# Patient Record
Sex: Female | Born: 1980 | Hispanic: No | Marital: Married | Smoking: Never smoker
Health system: Southern US, Community
[De-identification: ages and names within clinical notes are randomized; demographics above are authoritative.]

## PROBLEM LIST (undated history)

## (undated) DIAGNOSIS — Z789 Other specified health status: Secondary | ICD-10-CM

## (undated) HISTORY — PX: NO PAST SURGERIES: SHX2092

## (undated) HISTORY — DX: Other specified health status: Z78.9

---

## 2013-03-26 NOTE — L&D Delivery Note (Signed)
Delivery Note At 10:47 PM a viable female was delivered via Vaginal, Spontaneous Delivery (Presentation: Left Occiput Anterior).  APGAR: 9, 9; weight 7 lb 12.2 oz (3520 g).   Placenta status: Intact, Spontaneous.  Cord: 3 vessels with the following complications: None.  Cord pH: NA  Anesthesia: Epidural  Episiotomy: None Lacerations: 2nd degree Suture Repair: 3.0 monocryl on CT Est. Blood Loss (mL): 450  Mom to postpartum.  Baby to Couplet care / Skin to Skin.  32yo G2P1001 at 751w3d delivered a viable female infant over an intact perineum with a second degree laceration.  Nuchal cord reduced.  Active management of third stage of labor, pitocin given.  Placenta delivered spontaneously, 3 vessel cord, intact placenta.  Methergine given for persistent bleeding. Second degree laceration repaired with 3-0 monocryl. EBL 450ml.    I was present for the delivery and performed the repair. I agree with resident's note and plan of care.  Tawana ScaleMichael Ryan Chritopher Coster, MD OB Fellow 07/28/2013 12:48 AM

## 2013-04-23 ENCOUNTER — Encounter: Payer: Self-pay | Admitting: Family

## 2013-05-11 ENCOUNTER — Encounter: Payer: Self-pay | Admitting: Family Medicine

## 2013-05-11 ENCOUNTER — Ambulatory Visit (INDEPENDENT_AMBULATORY_CARE_PROVIDER_SITE_OTHER): Payer: Medicaid Other | Admitting: Family Medicine

## 2013-05-11 ENCOUNTER — Other Ambulatory Visit (HOSPITAL_COMMUNITY)
Admission: RE | Admit: 2013-05-11 | Discharge: 2013-05-11 | Disposition: A | Discharge status: Home / Self Care | Payer: Medicaid Other | Source: Ambulatory Visit | Attending: Family Medicine | Admitting: Family Medicine

## 2013-05-11 VITALS — BP 109/65 | Temp 96.9°F | Ht 62.0 in | Wt 125.6 lb

## 2013-05-11 DIAGNOSIS — O0930 Supervision of pregnancy with insufficient antenatal care, unspecified trimester: Secondary | ICD-10-CM

## 2013-05-11 DIAGNOSIS — N841 Polyp of cervix uteri: Secondary | ICD-10-CM | POA: Insufficient documentation

## 2013-05-11 DIAGNOSIS — Z1151 Encounter for screening for human papillomavirus (HPV): Secondary | ICD-10-CM | POA: Insufficient documentation

## 2013-05-11 DIAGNOSIS — Z609 Problem related to social environment, unspecified: Secondary | ICD-10-CM

## 2013-05-11 DIAGNOSIS — Z3480 Encounter for supervision of other normal pregnancy, unspecified trimester: Secondary | ICD-10-CM | POA: Insufficient documentation

## 2013-05-11 DIAGNOSIS — Z113 Encounter for screening for infections with a predominantly sexual mode of transmission: Secondary | ICD-10-CM | POA: Insufficient documentation

## 2013-05-11 DIAGNOSIS — Z01419 Encounter for gynecological examination (general) (routine) without abnormal findings: Secondary | ICD-10-CM | POA: Insufficient documentation

## 2013-05-11 DIAGNOSIS — Z789 Other specified health status: Secondary | ICD-10-CM

## 2013-05-11 DIAGNOSIS — Z23 Encounter for immunization: Secondary | ICD-10-CM

## 2013-05-11 LAB — POCT URINALYSIS DIP (DEVICE)
BILIRUBIN URINE: NEGATIVE
Glucose, UA: NEGATIVE mg/dL
Hgb urine dipstick: NEGATIVE
Ketones, ur: NEGATIVE mg/dL
NITRITE: NEGATIVE
PH: 7 (ref 5.0–8.0)
PROTEIN: NEGATIVE mg/dL
Specific Gravity, Urine: 1.015 (ref 1.005–1.030)
UROBILINOGEN UA: 0.2 mg/dL (ref 0.0–1.0)

## 2013-05-11 MED ORDER — TETANUS-DIPHTH-ACELL PERTUSSIS 5-2.5-18.5 LF-MCG/0.5 IM SUSP: 0.5000 mL | Freq: Once | INTRAMUSCULAR | Status: DC

## 2013-05-11 NOTE — Progress Notes (Signed)
U/S scheduled 05/14/13 at 730 am.

## 2013-05-11 NOTE — Patient Instructions (Addendum)
Breastfeeding Deciding to breastfeed is one of the best choices you can make for you and your baby. A change in hormones during pregnancy causes your breast tissue to grow and increases the number and size of your milk ducts. These hormones also allow proteins, sugars, and fats from your blood supply to make breast milk in your milk-producing glands. Hormones prevent breast milk from being released before your baby is born as well as prompt milk flow after birth. Once breastfeeding has begun, thoughts of your baby, as well as his or her sucking or crying, can stimulate the release of milk from your milk-producing glands.  BENEFITS OF BREASTFEEDING For Your Baby  Your first milk (colostrum) helps your baby's digestive system function better.   There are antibodies in your milk that help your baby fight off infections.   Your baby has a lower incidence of asthma, allergies, and sudden infant death syndrome.   The nutrients in breast milk are better for your baby than infant formulas and are designed uniquely for your baby's needs.   Breast milk improves your baby's brain development.   Your baby is less likely to develop other conditions, such as childhood obesity, asthma, or type 2 diabetes mellitus.  For You   Breastfeeding helps to create a very special bond between you and your baby.   Breastfeeding is convenient. Breast milk is always available at the correct temperature and costs nothing.   Breastfeeding helps to burn calories and helps you lose the weight gained during pregnancy.   Breastfeeding makes your uterus contract to its prepregnancy size faster and slows bleeding (lochia) after you give birth.   Breastfeeding helps to lower your risk of developing type 2 diabetes mellitus, osteoporosis, and breast or ovarian cancer later in life. SIGNS THAT YOUR BABY IS HUNGRY Early Signs of Hunger  Increased alertness or activity.  Stretching.  Movement of the head from  side to side.  Movement of the head and opening of the mouth when the corner of the mouth or cheek is stroked (rooting).  Increased sucking sounds, smacking lips, cooing, sighing, or squeaking.  Hand-to-mouth movements.  Increased sucking of fingers or hands. Late Signs of Hunger  Fussing.  Intermittent crying. Extreme Signs of Hunger Signs of extreme hunger will require calming and consoling before your baby will be able to breastfeed successfully. Do not wait for the following signs of extreme hunger to occur before you initiate breastfeeding:   Restlessness.  A loud, strong cry.   Screaming. BREASTFEEDING BASICS Breastfeeding Initiation  Find a comfortable place to sit or lie down, with your neck and back well supported.  Place a pillow or rolled up blanket under your baby to bring him or her to the level of your breast (if you are seated). Nursing pillows are specially designed to help support your arms and your baby while you breastfeed.  Make sure that your baby's abdomen is facing your abdomen.   Gently massage your breast. With your fingertips, massage from your chest wall toward your nipple in a circular motion. This encourages milk flow. You may need to continue this action during the feeding if your milk flows slowly.  Support your breast with 4 fingers underneath and your thumb above your nipple. Make sure your fingers are well away from your nipple and your baby's mouth.   Stroke your baby's lips gently with your finger or nipple.   When your baby's mouth is open wide enough, quickly bring your baby to your   breast, placing your entire nipple and as much of the colored area around your nipple (areola) as possible into your baby's mouth.   More areola should be visible above your baby's upper lip than below the lower lip.   Your baby's tongue should be between his or her lower gum and your breast.   Ensure that your baby's mouth is correctly positioned  around your nipple (latched). Your baby's lips should create a seal on your breast and be turned out (everted).  It is common for your baby to suck about 2 3 minutes in order to start the flow of breast milk. Latching Teaching your baby how to latch on to your breast properly is very important. An improper latch can cause nipple pain and decreased milk supply for you and poor weight gain in your baby. Also, if your baby is not latched onto your nipple properly, he or she may swallow some air during feeding. This can make your baby fussy. Burping your baby when you switch breasts during the feeding can help to get rid of the air. However, teaching your baby to latch on properly is still the best way to prevent fussiness from swallowing air while breastfeeding. Signs that your baby has successfully latched on to your nipple:    Silent tugging or silent sucking, without causing you pain.   Swallowing heard between every 3 4 sucks.    Muscle movement above and in front of his or her ears while sucking.  Signs that your baby has not successfully latched on to nipple:   Sucking sounds or smacking sounds from your baby while breastfeeding.  Nipple pain. If you think your baby has not latched on correctly, slip your finger into the corner of your baby's mouth to break the suction and place it between your baby's gums. Attempt breastfeeding initiation again. Signs of Successful Breastfeeding Signs from your baby:   A gradual decrease in the number of sucks or complete cessation of sucking.   Falling asleep.   Relaxation of his or her body.   Retention of a small amount of milk in his or her mouth.   Letting go of your breast by himself or herself. Signs from you:  Breasts that have increased in firmness, weight, and size 1 3 hours after feeding.   Breasts that are softer immediately after breastfeeding.  Increased milk volume, as well as a change in milk consistency and color by  the 5th day of breastfeeding.   Nipples that are not sore, cracked, or bleeding. Signs That Your Randel Books is Getting Enough Milk  Wetting at least 3 diapers in a 24-hour period. The urine should be clear and pale yellow by age 64411 days.  At least 3 stools in a 24-hour period by age 64411 days. The stool should be soft and yellow.  At least 3 stools in a 24-hour period by age 644 days. The stool should be seedy and yellow.  No loss of weight greater than 10% of birth weight during the first 22 days of age.  Average weight gain of 4 7 ounces (120 210 mL) per week after age 64 days.  Consistent daily weight gain by age 60 days, without weight loss after the age of 2 weeks. After a feeding, your baby may spit up a small amount. This is common. BREASTFEEDING FREQUENCY AND DURATION Frequent feeding will help you make more milk and can prevent sore nipples and breast engorgement. Breastfeed when you feel the need to reduce  the fullness of your breasts or when your baby shows signs of hunger. This is called "breastfeeding on demand." Avoid introducing a pacifier to your baby while you are working to establish breastfeeding (the first 4 6 weeks after your baby is born). After this time you may choose to use a pacifier. Research has shown that pacifier use during the first year of a baby's life decreases the risk of sudden infant death syndrome (SIDS). Allow your baby to feed on each breast as long as he or she wants. Breastfeed until your baby is finished feeding. When your baby unlatches or falls asleep while feeding from the first breast, offer the second breast. Because newborns are often sleepy in the first few weeks of life, you may need to awaken your baby to get him or her to feed. Breastfeeding times will vary from baby to baby. However, the following rules can serve as a guide to help you ensure that your baby is properly fed:  Newborns (babies 4 weeks of age or younger) may breastfeed every 1 3  hours.  Newborns should not go longer than 3 hours during the day or 5 hours during the night without breastfeeding.  You should breastfeed your baby a minimum of 8 times in a 24-hour period until you begin to introduce solid foods to your baby at around 6 months of age. BREAST MILK PUMPING Pumping and storing breast milk allows you to ensure that your baby is exclusively fed your breast milk, even at times when you are unable to breastfeed. This is especially important if you are going back to work while you are still breastfeeding or when you are not able to be present during feedings. Your lactation consultant can give you guidelines on how long it is safe to store breast milk.  A breast pump is a machine that allows you to pump milk from your breast into a sterile bottle. The pumped breast milk can then be stored in a refrigerator or freezer. Some breast pumps are operated by hand, while others use electricity. Ask your lactation consultant which type will work best for you. Breast pumps can be purchased, but some hospitals and breastfeeding support groups lease breast pumps on a monthly basis. A lactation consultant can teach you how to hand express breast milk, if you prefer not to use a pump.  CARING FOR YOUR BREASTS WHILE YOU BREASTFEED Nipples can become dry, cracked, and sore while breastfeeding. The following recommendations can help keep your breasts moisturized and healthy:  Avoid using soap on your nipples.   Wear a supportive bra. Although not required, special nursing bras and tank tops are designed to allow access to your breasts for breastfeeding without taking off your entire bra or top. Avoid wearing underwire style bras or extremely tight bras.  Air dry your nipples for 3 4minutes after each feeding.   Use only cotton bra pads to absorb leaked breast milk. Leaking of breast milk between feedings is normal.   Use lanolin on your nipples after breastfeeding. Lanolin helps to  maintain your skin's normal moisture barrier. If you use pure lanolin you do not need to wash it off before feeding your baby again. Pure lanolin is not toxic to your baby. You may also hand express a few drops of breast milk and gently massage that milk into your nipples and allow the milk to air dry. In the first few weeks after giving birth, some women experience extremely full breasts (engorgement). Engorgement can make   your breasts feel heavy, warm, and tender to the touch. Engorgement peaks within 3 5 days after you give birth. The following recommendations can help ease engorgement:  Completely empty your breasts while breastfeeding or pumping. You may want to start by applying warm, moist heat (in the shower or with warm water-soaked hand towels) just before feeding or pumping. This increases circulation and helps the milk flow. If your baby does not completely empty your breasts while breastfeeding, pump any extra milk after he or she is finished.  Wear a snug bra (nursing or regular) or tank top for 1 2 days to signal your body to slightly decrease milk production.  Apply ice packs to your breasts, unless this is too uncomfortable for you.  Make sure that your baby is latched on and positioned properly while breastfeeding. If engorgement persists after 48 hours of following these recommendations, contact your health care provider or a Advertising copywriterlactation consultant. OVERALL HEALTH CARE RECOMMENDATIONS WHILE BREASTFEEDING  Eat healthy foods. Alternate between meals and snacks, eating 3 of each per day. Because what you eat affects your breast milk, some of the foods may make your baby more irritable than usual. Avoid eating these foods if you are sure that they are negatively affecting your baby.  Drink milk, fruit juice, and water to satisfy your thirst (about 10 glasses a day).   Rest often, relax, and continue to take your prenatal vitamins to prevent fatigue, stress, and anemia.  Continue  breast self-awareness checks.  Avoid chewing and smoking tobacco.  Avoid alcohol and drug use. Some medicines that may be harmful to your baby can pass through breast milk. It is important to ask your health care provider before taking any medicine, including all over-the-counter and prescription medicine as well as vitamin and herbal supplements. It is possible to become pregnant while breastfeeding. If birth control is desired, ask your health care provider about options that will be safe for your baby. SEEK MEDICAL CARE IF:   You feel like you want to stop breastfeeding or have become frustrated with breastfeeding.  You have painful breasts or nipples.  Your nipples are cracked or bleeding.  Your breasts are red, tender, or warm.  You have a swollen area on either breast.  You have a fever or chills.  You have nausea or vomiting.  You have drainage other than breast milk from your nipples.  Your breasts do not become full before feedings by the 5th day after you give birth.  You feel sad and depressed.  Your baby is too sleepy to eat well.  Your baby is having trouble sleeping.   Your baby is wetting less than 3 diapers in a 24-hour period.  Your baby has less than 3 stools in a 24-hour period.  Your baby's skin or the white part of his or her eyes becomes yellow.   Your baby is not gaining weight by 275 days of age. SEEK IMMEDIATE MEDICAL CARE IF:   Your baby is overly tired (lethargic) and does not want to wake up and feed.  Your baby develops an unexplained fever. Document Released: 03/12/2005 Document Revised: 11/12/2012 Document Reviewed: 09/03/2012 [facility] Patient Information 2014 [place].   Second Trimester of Pregnancy The second trimester is from week 13 through week 28, months 4 through 6. The second trimester is often a time when you feel your best. Your body has also adjusted to being pregnant, and you begin to feel better physically. Usually,  morning sickness has lessened  or quit completely, you may have more energy, and you may have an increase in appetite. The second trimester is also a time when the fetus is growing rapidly. At the end of the sixth month, the fetus is about 9 inches long and weighs about 1 pounds. You will likely begin to feel the baby move (quickening) between 18 and 20 weeks of the pregnancy. BODY CHANGES Your body goes through many changes during pregnancy. The changes vary from woman to woman.   Your weight will continue to increase. You will notice your lower abdomen bulging out.  You may begin to get stretch marks on your hips, abdomen, and breasts.  You may develop headaches that can be relieved by medicines approved by your caregiver.  You may urinate more often because the fetus is pressing on your bladder.  You may develop or continue to have heartburn as a result of your pregnancy.  You may develop constipation because certain hormones are causing the muscles that push waste through your intestines to slow down.  You may develop hemorrhoids or swollen, bulging veins (varicose veins).  You may have back pain because of the weight gain and pregnancy hormones relaxing your joints between the bones in your pelvis and as a result of a shift in weight and the muscles that support your balance.  Your breasts will continue to grow and be tender.  Your gums may bleed and may be sensitive to brushing and flossing.  Dark spots or blotches (chloasma, mask of pregnancy) may develop on your face. This will likely fade after the baby is born.  A dark line from your belly button to the pubic area (linea nigra) may appear. This will likely fade after the baby is born. WHAT TO EXPECT AT YOUR PRENATAL VISITS During a routine prenatal visit:  You will be weighed to make sure you and the fetus are growing normally.  Your blood pressure will be taken.  Your abdomen will be measured to track your baby's  growth.  The fetal heartbeat will be listened to.  Any test results from the previous visit will be discussed. Your caregiver may ask you:  How you are feeling.  If you are feeling the baby move.  If you have had any abnormal symptoms, such as leaking fluid, bleeding, severe headaches, or abdominal cramping.  If you have any questions. Other tests that may be performed during your second trimester include:  Blood tests that check for:  Low iron levels (anemia).  Gestational diabetes (between 24 and 28 weeks).  Rh antibodies.  Urine tests to check for infections, diabetes, or protein in the urine.  An ultrasound to confirm the proper growth and development of the baby.  An amniocentesis to check for possible genetic problems.  Fetal screens for spina bifida and Down syndrome. HOME CARE INSTRUCTIONS   Avoid all smoking, herbs, alcohol, and unprescribed drugs. These chemicals affect the formation and growth of the baby.  Follow your caregiver's instructions regarding medicine use. There are medicines that are either safe or unsafe to take during pregnancy.  Exercise only as directed by your caregiver. Experiencing uterine cramps is a good sign to stop exercising.  Continue to eat regular, healthy meals.  Wear a good support bra for breast tenderness.  Do not use hot tubs, steam rooms, or saunas.  Wear your seat belt at all times when driving.  Avoid raw meat, uncooked cheese, cat litter boxes, and soil used by cats. These carry germs that can  cause birth defects in the baby.  Take your prenatal vitamins.  Try taking a stool softener (if your caregiver approves) if you develop constipation. Eat more high-fiber foods, such as fresh vegetables or fruit and whole grains. Drink plenty of fluids to keep your urine clear or pale yellow.  Take warm sitz baths to soothe any pain or discomfort caused by hemorrhoids. Use hemorrhoid cream if your caregiver approves.  If you  develop varicose veins, wear support hose. Elevate your feet for 15 minutes, 3 4 times a day. Limit salt in your diet.  Avoid heavy lifting, wear low heel shoes, and practice good posture.  Rest with your legs elevated if you have leg cramps or low back pain.  Visit your dentist if you have not gone yet during your pregnancy. Use a soft toothbrush to brush your teeth and be gentle when you floss.  A sexual relationship may be continued unless your caregiver directs you otherwise.  Continue to go to all your prenatal visits as directed by your caregiver. SEEK MEDICAL CARE IF:   You have dizziness.  You have mild pelvic cramps, pelvic pressure, or nagging pain in the abdominal area.  You have persistent nausea, vomiting, or diarrhea.  You have a bad smelling vaginal discharge.  You have pain with urination. SEEK IMMEDIATE MEDICAL CARE IF:   You have a fever.  You are leaking fluid from your vagina.  You have spotting or bleeding from your vagina.  You have severe abdominal cramping or pain.  You have rapid weight gain or loss.  You have shortness of breath with chest pain.  You notice sudden or extreme swelling of your face, hands, ankles, feet, or legs.  You have not felt your baby move in over an hour.  You have severe headaches that do not go away with medicine.  You have vision changes. Document Released: 03/06/2001 Document Revised: 11/12/2012 Document Reviewed: 05/13/2012 [facility] Patient Information 2014 [place], [place].

## 2013-05-11 NOTE — Addendum Note (Signed)
Addended by: Franchot MimesALFARO, Brekyn Huntoon on: 05/11/2013 01:55 PM   Modules accepted: Orders

## 2013-05-11 NOTE — Progress Notes (Signed)
   Subjective:    Alicia Riley is a G2P1001 5875w5d being seen today for her first obstetrical visit.  Her obstetrical history is significant for previous normal pregnancy. [place] Patient does intend to breast feed. Pregnancy history fully reviewed.  Patient reports no complaints.  Filed Vitals:   05/11/13 1018 05/11/13 1020  BP: 109/65   Temp: 96.9 F (36.1 C)   Height:  5\' 2"  (1.575 m)  Weight: 125 lb 9.6 oz (56.972 kg)     HISTORY: OB History  Gravida Para Term Preterm AB SAB TAB Ectopic Multiple Living  2 1 1       1     # Outcome Date GA Lbr Len/2nd Weight Sex Delivery Anes PTL Lv  2 CUR           1 TRM 04/12/07 2522w0d  7 lb (3.175 kg) M SVD None  Y     Past Medical History  Diagnosis Date  . Medical history non-contributory    Past Surgical History  Procedure Laterality Date  . No past surgeries     History reviewed. No pertinent family history.   Exam    Uterus:    26 wks  Pelvic Exam:    Perineum: Hemorrhoids   Vulva: Bartholin's, Urethra, Skene's normal   Vagina:  normal mucosa, normal discharge   Cervix: multiparous appearance and very friable, large cervical polyp noted    Adnexa: normal adnexa   Bony Pelvis: average  System: Breast:  normal appearance, no masses or tenderness   Skin: normal coloration and turgor, no rashes    Neurologic: oriented   Extremities: normal strength, tone, and muscle mass   HEENT sclera clear, anicteric   Mouth/Teeth mucous membranes moist, pharynx normal without lesions   Neck supple and no masses   Cardiovascular: regular rate and rhythm, no murmurs or gallops   Respiratory:  appears well, vitals normal, no respiratory distress, acyanotic, normal RR, ear and throat exam is normal, neck free of mass or lymphadenopathy, chest clear, no wheezing, crepitations, rhonchi, normal symmetric air entry   Abdomen: soft, non-tender; bowel sounds normal; no masses,  no organomegaly     Procedure:  Cervix  visualized and polyp noted.  After pap smear obtained.  Ring forcep applied to cervix and twisting motion removed polyp intact.  Hemostasis obtained with Monsel's solution. Assessment:    Pregnancy: G2P1001 Patient Active Problem List   Diagnosis Date Noted  . Supervision of other normal pregnancy 05/11/2013        Plan:     Initial labs drawn-28 wk labs Prenatal vitamins. Problem list reviewed and updated. Genetic Screening discussed Quad Screen: too late.  Ultrasound discussed; fetal survey: ordered.  Follow up in 2 weeks. TDaP and Flu today Pap Cervical polyp to pathology  Chidubem Chaires S 05/11/2013

## 2013-05-11 NOTE — Progress Notes (Signed)
Nutrition note: 1st visit consult Pt has gained 15.6# @ 5862w1d, which is wnl. Pt reports eating 3 meals & 2 snacks/d. Pt is taking PNV. Pt reports no N/V or heartburn. Pt received verbal & written education via [place] interpreter about general nutrition during pregnancy.  Discussed wt gain goals of 25-35# or 1#/wk. Pt agrees to continue taking PNV. Pt does not have WIC but plans to apply. Pt plans to BF. F/u if referred Blondell RevealLaura Zaylin Pistilli, MS, RD, LDN, [facility]

## 2013-05-11 NOTE — Progress Notes (Signed)
Pulse- 82 Weight gain 25-35lbs New ob packet given

## 2013-05-12 LAB — OBSTETRIC PANEL
ANTIBODY SCREEN: NEGATIVE
BASOS PCT: 0 % (ref 0–1)
Basophils Absolute: 0 10*3/uL (ref 0.0–0.1)
EOS PCT: 2 % (ref 0–5)
Eosinophils Absolute: 0.1 10*3/uL (ref 0.0–0.7)
HEMATOCRIT: 32.7 % — AB (ref 36.0–46.0)
Hemoglobin: 10.8 g/dL — ABNORMAL LOW (ref 12.0–15.0)
Hepatitis B Surface Ag: NEGATIVE
Lymphocytes Relative: 18 % (ref 12–46)
Lymphs Abs: 1.3 10*3/uL (ref 0.7–4.0)
MCH: 29.5 pg (ref 26.0–34.0)
MCHC: 33 g/dL (ref 30.0–36.0)
MCV: 89.3 fL (ref 78.0–100.0)
MONO ABS: 0.4 10*3/uL (ref 0.1–1.0)
Monocytes Relative: 5 % (ref 3–12)
Neutro Abs: 5.5 10*3/uL (ref 1.7–7.7)
Neutrophils Relative %: 75 % (ref 43–77)
Platelets: 225 10*3/uL (ref 150–400)
RBC: 3.66 MIL/uL — ABNORMAL LOW (ref 3.87–5.11)
RDW: 15.8 % — AB (ref 11.5–15.5)
Rh Type: POSITIVE
Rubella: 4.86 Index — ABNORMAL HIGH (ref <0.90)
WBC: 7.3 10*3/uL (ref 4.0–10.5)

## 2013-05-12 LAB — HEMOGLOBINOPATHY EVALUATION
HEMOGLOBIN OTHER: 0 %
HGB A2 QUANT: 2.5 % (ref 2.2–3.2)
HGB A: 97.5 % (ref 96.8–97.8)
Hgb F Quant: 0 % (ref 0.0–2.0)
Hgb S Quant: 0 %

## 2013-05-12 LAB — HIV ANTIBODY (ROUTINE TESTING W REFLEX): HIV: NONREACTIVE

## 2013-05-13 LAB — PRESCRIPTION MONITORING PROFILE (19 PANEL)
Amphetamine/Meth: NEGATIVE ng/mL
Barbiturate Screen, Urine: NEGATIVE ng/mL
Benzodiazepine Screen, Urine: NEGATIVE ng/mL
Buprenorphine, Urine: NEGATIVE ng/mL
Cannabinoid Scrn, Ur: NEGATIVE ng/mL
Carisoprodol, Urine: NEGATIVE ng/mL
Cocaine Metabolites: NEGATIVE ng/mL
Creatinine, Urine: 39.96 mg/dL (ref >20.0)
ECSTASY: NEGATIVE ng/mL
Fentanyl, Ur: NEGATIVE ng/mL
MEPERIDINE UR: NEGATIVE ng/mL
METHADONE SCREEN, URINE: NEGATIVE ng/mL
METHAQUALONE SCREEN (URINE): NEGATIVE ng/mL
Nitrites, Initial: NEGATIVE ug/mL
OXYCODONE SCRN UR: NEGATIVE ng/mL
Opiate Screen, Urine: NEGATIVE ng/mL
PHENCYCLIDINE, UR: NEGATIVE ng/mL
PROPOXYPHENE: NEGATIVE ng/mL
TAPENTADOLUR: NEGATIVE ng/mL
Tramadol Scrn, Ur: NEGATIVE ng/mL
Zolpidem, Urine: NEGATIVE ng/mL
pH, Initial: 7.5 pH (ref 4.5–8.9)

## 2013-05-13 LAB — CULTURE, OB URINE
Colony Count: NO GROWTH
Organism ID, Bacteria: NO GROWTH

## 2013-05-13 LAB — GLUCOSE TOLERANCE, 1 HOUR (50G) W/O FASTING: Glucose, 1 Hour GTT: 123 mg/dL (ref 70–140)

## 2013-05-14 ENCOUNTER — Telehealth: Payer: Self-pay | Admitting: *Deleted

## 2013-05-14 ENCOUNTER — Ambulatory Visit (HOSPITAL_COMMUNITY)
Admission: RE | Admit: 2013-05-14 | Discharge: 2013-05-14 | Disposition: A | Discharge status: Home / Self Care | Payer: Medicaid Other | Source: Ambulatory Visit | Attending: Family Medicine | Admitting: Family Medicine

## 2013-05-14 ENCOUNTER — Other Ambulatory Visit: Payer: Self-pay | Admitting: Family Medicine

## 2013-05-14 ENCOUNTER — Encounter: Payer: Self-pay | Admitting: Family Medicine

## 2013-05-14 DIAGNOSIS — Z3480 Encounter for supervision of other normal pregnancy, unspecified trimester: Secondary | ICD-10-CM

## 2013-05-14 DIAGNOSIS — A749 Chlamydial infection, unspecified: Secondary | ICD-10-CM

## 2013-05-14 DIAGNOSIS — O98813 Other maternal infectious and parasitic diseases complicating pregnancy, third trimester: Secondary | ICD-10-CM

## 2013-05-14 DIAGNOSIS — Z3689 Encounter for other specified antenatal screening: Secondary | ICD-10-CM | POA: Insufficient documentation

## 2013-05-14 NOTE — Telephone Encounter (Signed)
Message copied by Dorothyann PengHAIZLIP, Saphia Vanderford E on Thu May 14, 2013  4:18 PM ------      Message from: Reva BoresPRATT, TANYA S      Created: Thu May 14, 2013  7:52 AM       Has positive chlamydia on pap--needs treatment with 1 gm zithromax.  Partner referral.  Otherwise labs including pap and biopsy were normal.  Will need interpreter. ------

## 2013-05-14 NOTE — Telephone Encounter (Signed)
Called cell number, invalid, called home number with [employer] interpre[employer] and man who answered said he would get her the number to our office through her case worker.

## 2013-05-25 ENCOUNTER — Encounter: Payer: Self-pay | Admitting: *Deleted

## 2013-05-25 MED ORDER — AZITHROMYCIN 250 MG PO TABS: 1000.0000 mg | ORAL_TABLET | Freq: Once | ORAL | Status: DC

## 2013-05-25 NOTE — Telephone Encounter (Signed)
Certified letter mailed to patient.  ° °

## 2013-05-26 ENCOUNTER — Encounter: Payer: Self-pay | Admitting: *Deleted

## 2013-05-30 ENCOUNTER — Encounter: Payer: Self-pay | Admitting: *Deleted

## 2013-06-15 ENCOUNTER — Ambulatory Visit (INDEPENDENT_AMBULATORY_CARE_PROVIDER_SITE_OTHER): Payer: Medicaid Other | Admitting: Obstetrics & Gynecology

## 2013-06-15 ENCOUNTER — Encounter: Payer: Self-pay | Admitting: Obstetrics & Gynecology

## 2013-06-15 VITALS — BP 116/71 | Temp 97.5°F | Wt 132.0 lb

## 2013-06-15 DIAGNOSIS — Z609 Problem related to social environment, unspecified: Secondary | ICD-10-CM

## 2013-06-15 DIAGNOSIS — Z789 Other specified health status: Secondary | ICD-10-CM

## 2013-06-15 DIAGNOSIS — Z3480 Encounter for supervision of other normal pregnancy, unspecified trimester: Secondary | ICD-10-CM

## 2013-06-15 DIAGNOSIS — O98819 Other maternal infectious and parasitic diseases complicating pregnancy, unspecified trimester: Secondary | ICD-10-CM

## 2013-06-15 DIAGNOSIS — Z23 Encounter for immunization: Secondary | ICD-10-CM

## 2013-06-15 LAB — POCT URINALYSIS DIP (DEVICE)
BILIRUBIN URINE: NEGATIVE
Glucose, UA: NEGATIVE mg/dL
Hgb urine dipstick: NEGATIVE
KETONES UR: NEGATIVE mg/dL
Nitrite: NEGATIVE
PH: 6 (ref 5.0–8.0)
Protein, ur: NEGATIVE mg/dL
Specific Gravity, Urine: 1.01 (ref 1.005–1.030)
Urobilinogen, UA: 0.2 mg/dL (ref 0.0–1.0)

## 2013-06-15 MED ORDER — TETANUS-DIPHTH-ACELL PERTUSSIS 5-2.5-18.5 LF-MCG/0.5 IM SUSP
0.5000 mL | Freq: Once | INTRAMUSCULAR | Status: AC
  Administered 2013-06-15: 0.5 mL via INTRAMUSCULAR

## 2013-06-15 NOTE — Progress Notes (Signed)
Transferred care from [place] 03/04/14 19.5 EDC 07/24/13, noted in Epic. 1 hr GTT today, Tdap  US 2 19 15  normal growth

## 2013-06-15 NOTE — Patient Instructions (Signed)
Third Trimester of Pregnancy  The third trimester is from week 29 through week 42, months 7 through 9. The third trimester is a time when the fetus is growing rapidly. At the end of the ninth month, the fetus is about 20 inches in length and weighs 6 10 pounds.   BODY CHANGES  Your body goes through many changes during pregnancy. The changes vary from woman to woman.    Your weight will continue to increase. You can expect to gain 25 35 pounds (11 16 kg) by the end of the pregnancy.   You may begin to get stretch marks on your hips, abdomen, and breasts.   You may urinate more often because the fetus is moving lower into your pelvis and pressing on your bladder.   You may develop or continue to have heartburn as a result of your pregnancy.   You may develop constipation because certain hormones are causing the muscles that push waste through your intestines to slow down.   You may develop hemorrhoids or swollen, bulging veins (varicose veins).   You may have pelvic pain because of the weight gain and pregnancy hormones relaxing your joints between the bones in your pelvis. Back aches may result from over exertion of the muscles supporting your posture.   Your breasts will continue to grow and be tender. A yellow discharge may leak from your breasts called colostrum.   Your belly button may stick out.   You may feel short of breath because of your expanding uterus.   You may notice the fetus "dropping," or moving lower in your abdomen.   You may have a bloody mucus discharge. This usually occurs a few days to a week before labor begins.   Your cervix becomes thin and soft (effaced) near your due date.  WHAT TO EXPECT AT YOUR PRENATAL EXAMS   You will have prenatal exams every 2 weeks until week 36. Then, you will have weekly prenatal exams. During a routine prenatal visit:   You will be weighed to make sure you and the fetus are growing normally.   Your blood pressure is taken.   Your abdomen will be  measured to track your baby's growth.   The fetal heartbeat will be listened to.   Any test results from the previous visit will be discussed.   You may have a cervical check near your due date to see if you have effaced.  At around 36 weeks, your caregiver will check your cervix. At the same time, your caregiver will also perform a test on the secretions of the vaginal tissue. This test is to determine if a type of bacteria, Group B streptococcus, is present. Your caregiver will explain this further.  Your caregiver may ask you:   What your birth plan is.   How you are feeling.   If you are feeling the baby move.   If you have had any abnormal symptoms, such as leaking fluid, bleeding, severe headaches, or abdominal cramping.   If you have any questions.  Other tests or screenings that may be performed during your third trimester include:   Blood tests that check for low iron levels (anemia).   Fetal testing to check the health, activity level, and growth of the fetus. Testing is done if you have certain medical conditions or if there are problems during the pregnancy.  FALSE LABOR  You may feel small, irregular contractions that eventually go away. These are called Braxton Hicks contractions, or   false labor. Contractions may last for hours, days, or even weeks before true labor sets in. If contractions come at regular intervals, intensify, or become painful, it is best to be seen by your caregiver.   SIGNS OF LABOR    Menstrual-like cramps.   Contractions that are 5 minutes apart or less.   Contractions that start on the top of the uterus and spread down to the lower abdomen and back.   A sense of increased pelvic pressure or back pain.   A watery or bloody mucus discharge that comes from the vagina.  If you have any of these signs before the 37th week of pregnancy, call your caregiver right away. You need to go to the hospital to get checked immediately.  HOME CARE INSTRUCTIONS    Avoid all  smoking, herbs, alcohol, and unprescribed drugs. These chemicals affect the formation and growth of the baby.   Follow your caregiver's instructions regarding medicine use. There are medicines that are either safe or unsafe to take during pregnancy.   Exercise only as directed by your caregiver. Experiencing uterine cramps is a good sign to stop exercising.   Continue to eat regular, healthy meals.   Wear a good support bra for breast tenderness.   Do not use hot tubs, steam rooms, or saunas.   Wear your seat belt at all times when driving.   Avoid raw meat, uncooked cheese, cat litter boxes, and soil used by cats. These carry germs that can cause birth defects in the baby.   Take your prenatal vitamins.   Try taking a stool softener (if your caregiver approves) if you develop constipation. Eat more high-fiber foods, such as fresh vegetables or fruit and whole grains. Drink plenty of fluids to keep your urine clear or pale yellow.   Take warm sitz baths to soothe any pain or discomfort caused by hemorrhoids. Use hemorrhoid cream if your caregiver approves.   If you develop varicose veins, wear support hose. Elevate your feet for 15 minutes, 3 4 times a day. Limit salt in your diet.   Avoid heavy lifting, wear low heal shoes, and practice good posture.   Rest a lot with your legs elevated if you have leg cramps or low back pain.   Visit your dentist if you have not gone during your pregnancy. Use a soft toothbrush to brush your teeth and be gentle when you floss.   A sexual relationship may be continued unless your caregiver directs you otherwise.   Do not travel far distances unless it is absolutely necessary and only with the approval of your caregiver.   Take prenatal classes to understand, practice, and ask questions about the labor and delivery.   Make a trial run to the hospital.   Pack your hospital bag.   Prepare the baby's nursery.   Continue to go to all your prenatal visits as directed  by your caregiver.  SEEK MEDICAL CARE IF:   You are unsure if you are in labor or if your water has broken.   You have dizziness.   You have mild pelvic cramps, pelvic pressure, or nagging pain in your abdominal area.   You have persistent nausea, vomiting, or diarrhea.   You have a bad smelling vaginal discharge.   You have pain with urination.  SEEK IMMEDIATE MEDICAL CARE IF:    You have a fever.   You are leaking fluid from your vagina.   You have spotting or bleeding from your vagina.     You have severe abdominal cramping or pain.   You have rapid weight loss or gain.   You have shortness of breath with chest pain.   You notice sudden or extreme swelling of your face, hands, ankles, feet, or legs.   You have not felt your baby move in over an hour.   You have severe headaches that do not go away with medicine.   You have vision changes.  Document Released: 03/06/2001 Document Revised: 11/12/2012 Document Reviewed: 05/13/2012  [employer] Patient Information 2014 [employer].

## 2013-06-15 NOTE — Progress Notes (Signed)
Pulse- 89  Pain/pressure-lower abd

## 2013-06-16 LAB — GLUCOSE TOLERANCE, 1 HOUR (50G) W/O FASTING: Glucose, 1 Hour GTT: 128 mg/dL (ref 70–140)

## 2013-07-01 ENCOUNTER — Ambulatory Visit (INDEPENDENT_AMBULATORY_CARE_PROVIDER_SITE_OTHER): Payer: Medicaid Other | Admitting: Advanced Practice Midwife

## 2013-07-01 VITALS — BP 103/61 | Temp 97.1°F | Wt 135.3 lb

## 2013-07-01 DIAGNOSIS — Z3480 Encounter for supervision of other normal pregnancy, unspecified trimester: Secondary | ICD-10-CM

## 2013-07-01 DIAGNOSIS — L293 Anogenital pruritus, unspecified: Secondary | ICD-10-CM

## 2013-07-01 DIAGNOSIS — N898 Other specified noninflammatory disorders of vagina: Secondary | ICD-10-CM

## 2013-07-01 LAB — POCT URINALYSIS DIP (DEVICE)
Bilirubin Urine: NEGATIVE
Glucose, UA: NEGATIVE mg/dL
Hgb urine dipstick: NEGATIVE
KETONES UR: NEGATIVE mg/dL
Nitrite: NEGATIVE
Protein, ur: NEGATIVE mg/dL
Specific Gravity, Urine: 1.02 (ref 1.005–1.030)
Urobilinogen, UA: 0.2 mg/dL (ref 0.0–1.0)
pH: 7 (ref 5.0–8.0)

## 2013-07-01 LAB — OB RESULTS CONSOLE GC/CHLAMYDIA: CHLAMYDIA, DNA PROBE: POSITIVE

## 2013-07-01 LAB — OB RESULTS CONSOLE GBS: GBS: NEGATIVE

## 2013-07-01 NOTE — Progress Notes (Signed)
Pulse- 84 Patient reports some chest pain at times

## 2013-07-01 NOTE — Progress Notes (Signed)
Doing well.  Good fetal movement, denies vaginal bleeding, LOF, regular contractions.  Does have pain under her left ribs, mild to palpation, reports she cannot sleep on side with pain.  Recommend position changes, hands and knees especially to encourage baby to change position. Heat/ice/warm bath/Tylenol for pain.  Also reports some vaginal itching recently.  GBS/GCC and wet prep collected.

## 2013-07-02 LAB — GC/CHLAMYDIA PROBE AMP
CT Probe RNA: POSITIVE — AB
GC Probe RNA: NEGATIVE

## 2013-07-02 LAB — WET PREP, GENITAL
CLUE CELLS WET PREP: NONE SEEN
Trich, Wet Prep: NONE SEEN
WBC, Wet Prep HPF POC: NONE SEEN

## 2013-07-04 LAB — CULTURE, BETA STREP (GROUP B ONLY)

## 2013-07-04 LAB — OB RESULTS CONSOLE GC/CHLAMYDIA: Gonorrhea: NEGATIVE

## 2013-07-07 ENCOUNTER — Telehealth: Payer: Self-pay | Admitting: *Deleted

## 2013-07-07 DIAGNOSIS — A749 Chlamydial infection, unspecified: Secondary | ICD-10-CM

## 2013-07-07 DIAGNOSIS — O98819 Other maternal infectious and parasitic diseases complicating pregnancy, unspecified trimester: Principal | ICD-10-CM

## 2013-07-07 DIAGNOSIS — B379 Candidiasis, unspecified: Secondary | ICD-10-CM

## 2013-07-07 MED ORDER — AZITHROMYCIN 250 MG PO TABS: 1000.0000 mg | ORAL_TABLET | Freq: Once | ORAL | Status: DC

## 2013-07-07 MED ORDER — FLUCONAZOLE 150 MG PO TABS: 150.0000 mg | ORAL_TABLET | Freq: Once | ORAL | Status: DC

## 2013-07-07 NOTE — Telephone Encounter (Signed)
Upon review of lab results I found that patient was positive for chlamydia as well as yeast. Rx for treatment sent to pharmacy per protocol. STD Card sent to the health department. Spoke with patient via [employer] interepreters. Patient informed of infection and told to pick up meds for treatment. Also informed her that she needs to inform her partners and they should all be treated prior to her having intercourse with them again. Patient voiced understanding and was agreeable to plan.

## 2013-07-15 ENCOUNTER — Ambulatory Visit (INDEPENDENT_AMBULATORY_CARE_PROVIDER_SITE_OTHER): Payer: Medicaid Other | Admitting: Obstetrics and Gynecology

## 2013-07-15 ENCOUNTER — Encounter: Payer: Self-pay | Admitting: Obstetrics and Gynecology

## 2013-07-15 VITALS — BP 100/64 | HR 84 | Temp 97.1°F | Wt 137.0 lb

## 2013-07-15 DIAGNOSIS — Z3480 Encounter for supervision of other normal pregnancy, unspecified trimester: Secondary | ICD-10-CM

## 2013-07-15 DIAGNOSIS — O98819 Other maternal infectious and parasitic diseases complicating pregnancy, unspecified trimester: Secondary | ICD-10-CM

## 2013-07-15 LAB — POCT URINALYSIS DIP (DEVICE)
Bilirubin Urine: NEGATIVE
GLUCOSE, UA: NEGATIVE mg/dL
HGB URINE DIPSTICK: NEGATIVE
Ketones, ur: NEGATIVE mg/dL
Leukocytes, UA: NEGATIVE
NITRITE: NEGATIVE
PH: 7 (ref 5.0–8.0)
Protein, ur: NEGATIVE mg/dL
Specific Gravity, Urine: 1.02 (ref 1.005–1.030)
UROBILINOGEN UA: 0.2 mg/dL (ref 0.0–1.0)

## 2013-07-15 NOTE — Progress Notes (Signed)
Doing well. No concerns. No s/sx labor. Danger signs reviewed. Good FM. Cultures neg Interpreter for viist

## 2013-07-15 NOTE — Patient Instructions (Signed)
Third Trimester of Pregnancy  The third trimester is from week 29 through week 42, months 7 through 9. The third trimester is a time when the fetus is growing rapidly. At the end of the ninth month, the fetus is about 20 inches in length and weighs 6 10 pounds.   BODY CHANGES  Your body goes through many changes during pregnancy. The changes vary from woman to woman.    Your weight will continue to increase. You can expect to gain 25 35 pounds (11 16 kg) by the end of the pregnancy.   You may begin to get stretch marks on your hips, abdomen, and breasts.   You may urinate more often because the fetus is moving lower into your pelvis and pressing on your bladder.   You may develop or continue to have heartburn as a result of your pregnancy.   You may develop constipation because certain hormones are causing the muscles that push waste through your intestines to slow down.   You may develop hemorrhoids or swollen, bulging veins (varicose veins).   You may have pelvic pain because of the weight gain and pregnancy hormones relaxing your joints between the bones in your pelvis. Back aches may result from over exertion of the muscles supporting your posture.   Your breasts will continue to grow and be tender. A yellow discharge may leak from your breasts called colostrum.   Your belly button may stick out.   You may feel short of breath because of your expanding uterus.   You may notice the fetus "dropping," or moving lower in your abdomen.   You may have a bloody mucus discharge. This usually occurs a few days to a week before labor begins.   Your cervix becomes thin and soft (effaced) near your due date.  WHAT TO EXPECT AT YOUR PRENATAL EXAMS   You will have prenatal exams every 2 weeks until week 36. Then, you will have weekly prenatal exams. During a routine prenatal visit:   You will be weighed to make sure you and the fetus are growing normally.   Your blood pressure is taken.   Your abdomen will be  measured to track your baby's growth.   The fetal heartbeat will be listened to.   Any test results from the previous visit will be discussed.   You may have a cervical check near your due date to see if you have effaced.  At around 36 weeks, your caregiver will check your cervix. At the same time, your caregiver will also perform a test on the secretions of the vaginal tissue. This test is to determine if a type of bacteria, Group B streptococcus, is present. Your caregiver will explain this further.  Your caregiver may ask you:   What your birth plan is.   How you are feeling.   If you are feeling the baby move.   If you have had any abnormal symptoms, such as leaking fluid, bleeding, severe headaches, or abdominal cramping.   If you have any questions.  Other tests or screenings that may be performed during your third trimester include:   Blood tests that check for low iron levels (anemia).   Fetal testing to check the health, activity level, and growth of the fetus. Testing is done if you have certain medical conditions or if there are problems during the pregnancy.  FALSE LABOR  You may feel small, irregular contractions that eventually go away. These are called Braxton Hicks contractions, or   false labor. Contractions may last for hours, days, or even weeks before true labor sets in. If contractions come at regular intervals, intensify, or become painful, it is best to be seen by your caregiver.   SIGNS OF LABOR    Menstrual-like cramps.   Contractions that are 5 minutes apart or less.   Contractions that start on the top of the uterus and spread down to the lower abdomen and back.   A sense of increased pelvic pressure or back pain.   A watery or bloody mucus discharge that comes from the vagina.  If you have any of these signs before the 37th week of pregnancy, call your caregiver right away. You need to go to the hospital to get checked immediately.  HOME CARE INSTRUCTIONS    Avoid all  smoking, herbs, alcohol, and unprescribed drugs. These chemicals affect the formation and growth of the baby.   Follow your caregiver's instructions regarding medicine use. There are medicines that are either safe or unsafe to take during pregnancy.   Exercise only as directed by your caregiver. Experiencing uterine cramps is a good sign to stop exercising.   Continue to eat regular, healthy meals.   Wear a good support bra for breast tenderness.   Do not use hot tubs, steam rooms, or saunas.   Wear your seat belt at all times when driving.   Avoid raw meat, uncooked cheese, cat litter boxes, and soil used by cats. These carry germs that can cause birth defects in the baby.   Take your prenatal vitamins.   Try taking a stool softener (if your caregiver approves) if you develop constipation. Eat more high-fiber foods, such as fresh vegetables or fruit and whole grains. Drink plenty of fluids to keep your urine clear or pale yellow.   Take warm sitz baths to soothe any pain or discomfort caused by hemorrhoids. Use hemorrhoid cream if your caregiver approves.   If you develop varicose veins, wear support hose. Elevate your feet for 15 minutes, 3 4 times a day. Limit salt in your diet.   Avoid heavy lifting, wear low heal shoes, and practice good posture.   Rest a lot with your legs elevated if you have leg cramps or low back pain.   Visit your dentist if you have not gone during your pregnancy. Use a soft toothbrush to brush your teeth and be gentle when you floss.   A sexual relationship may be continued unless your caregiver directs you otherwise.   Do not travel far distances unless it is absolutely necessary and only with the approval of your caregiver.   Take prenatal classes to understand, practice, and ask questions about the labor and delivery.   Make a trial run to the hospital.   Pack your hospital bag.   Prepare the baby's nursery.   Continue to go to all your prenatal visits as directed  by your caregiver.  SEEK MEDICAL CARE IF:   You are unsure if you are in labor or if your water has broken.   You have dizziness.   You have mild pelvic cramps, pelvic pressure, or nagging pain in your abdominal area.   You have persistent nausea, vomiting, or diarrhea.   You have a bad smelling vaginal discharge.   You have pain with urination.  SEEK IMMEDIATE MEDICAL CARE IF:    You have a fever.   You are leaking fluid from your vagina.   You have spotting or bleeding from your vagina.     You have severe abdominal cramping or pain.   You have rapid weight loss or gain.   You have shortness of breath with chest pain.   You notice sudden or extreme swelling of your face, hands, ankles, feet, or legs.   You have not felt your baby move in over an hour.   You have severe headaches that do not go away with medicine.   You have vision changes.  Document Released: 03/06/2001 Document Revised: 11/12/2012 Document Reviewed: 05/13/2012  [employer] Patient Information 2014 [employer].

## 2013-07-20 ENCOUNTER — Encounter: Payer: Self-pay | Admitting: *Deleted

## 2013-07-23 ENCOUNTER — Ambulatory Visit (INDEPENDENT_AMBULATORY_CARE_PROVIDER_SITE_OTHER): Payer: Medicaid Other | Admitting: Nurse Practitioner

## 2013-07-23 VITALS — BP 102/66 | HR 76 | Temp 97.7°F | Wt 137.6 lb

## 2013-07-23 DIAGNOSIS — O98819 Other maternal infectious and parasitic diseases complicating pregnancy, unspecified trimester: Secondary | ICD-10-CM

## 2013-07-23 DIAGNOSIS — Z3480 Encounter for supervision of other normal pregnancy, unspecified trimester: Secondary | ICD-10-CM

## 2013-07-23 LAB — POCT URINALYSIS DIP (DEVICE)
Bilirubin Urine: NEGATIVE
Glucose, UA: NEGATIVE mg/dL
Ketones, ur: NEGATIVE mg/dL
LEUKOCYTES UA: NEGATIVE
Nitrite: NEGATIVE
PH: 6.5 (ref 5.0–8.0)
PROTEIN: NEGATIVE mg/dL
Specific Gravity, Urine: 1.025 (ref 1.005–1.030)
UROBILINOGEN UA: 0.2 mg/dL (ref 0.0–1.0)

## 2013-07-23 NOTE — Progress Notes (Signed)
Reviewed labor management to include when to go to MAU. She has no complaints today. Has an interpreter with her today.

## 2013-07-27 ENCOUNTER — Inpatient Hospital Stay (HOSPITAL_COMMUNITY)
Admission: AD | Admit: 2013-07-27 | Discharge: 2013-07-29 | DRG: 775 | Disposition: A | Discharge status: Home / Self Care | Payer: Medicaid Other | Source: Ambulatory Visit | Attending: Obstetrics & Gynecology | Admitting: Obstetrics & Gynecology

## 2013-07-27 ENCOUNTER — Inpatient Hospital Stay (HOSPITAL_COMMUNITY): Payer: Medicaid Other | Admitting: Anesthesiology

## 2013-07-27 ENCOUNTER — Encounter (HOSPITAL_COMMUNITY): Payer: Self-pay | Admitting: *Deleted

## 2013-07-27 ENCOUNTER — Encounter (HOSPITAL_COMMUNITY): Payer: Medicaid Other | Admitting: Anesthesiology

## 2013-07-27 DIAGNOSIS — Z3480 Encounter for supervision of other normal pregnancy, unspecified trimester: Secondary | ICD-10-CM

## 2013-07-27 DIAGNOSIS — O98813 Other maternal infectious and parasitic diseases complicating pregnancy, third trimester: Secondary | ICD-10-CM

## 2013-07-27 DIAGNOSIS — IMO0001 Reserved for inherently not codable concepts without codable children: Secondary | ICD-10-CM

## 2013-07-27 DIAGNOSIS — A749 Chlamydial infection, unspecified: Secondary | ICD-10-CM

## 2013-07-27 LAB — CBC
HEMATOCRIT: 33 % — AB (ref 36.0–46.0)
HEMOGLOBIN: 11 g/dL — AB (ref 12.0–15.0)
MCH: 29.6 pg (ref 26.0–34.0)
MCHC: 33.3 g/dL (ref 30.0–36.0)
MCV: 88.9 fL (ref 78.0–100.0)
Platelets: 161 10*3/uL (ref 150–400)
RBC: 3.71 MIL/uL — ABNORMAL LOW (ref 3.87–5.11)
RDW: 14.4 % (ref 11.5–15.5)
WBC: 11.4 10*3/uL — ABNORMAL HIGH (ref 4.0–10.5)

## 2013-07-27 LAB — TYPE AND SCREEN
ABO/RH(D): A POS
Antibody Screen: NEGATIVE

## 2013-07-27 LAB — RPR

## 2013-07-27 LAB — ABO/RH: ABO/RH(D): A POS

## 2013-07-27 MED ORDER — DIPHENHYDRAMINE HCL 50 MG/ML IJ SOLN: 12.5000 mg | INTRAMUSCULAR | Status: DC | PRN

## 2013-07-27 MED ORDER — CITRIC ACID-SODIUM CITRATE 334-500 MG/5ML PO SOLN: 30.0000 mL | ORAL | Status: DC | PRN

## 2013-07-27 MED ORDER — FENTANYL 2.5 MCG/ML BUPIVACAINE 1/10 % EPIDURAL INFUSION (WH - ANES): 14.0000 mL/h | INTRAMUSCULAR | Status: DC | PRN

## 2013-07-27 MED ORDER — LACTATED RINGERS IV SOLN: 500.0000 mL | INTRAVENOUS | Status: DC | PRN

## 2013-07-27 MED ORDER — BUTORPHANOL TARTRATE 1 MG/ML IJ SOLN: 1.0000 mg | INTRAMUSCULAR | Status: DC | PRN

## 2013-07-27 MED ORDER — EPHEDRINE 5 MG/ML INJ
10.0000 mg | INTRAVENOUS | Status: DC | PRN
  Filled 2013-07-27: qty 2

## 2013-07-27 MED ORDER — PHENYLEPHRINE 40 MCG/ML (10ML) SYRINGE FOR IV PUSH (FOR BLOOD PRESSURE SUPPORT)
PREFILLED_SYRINGE | INTRAVENOUS | Status: AC
  Filled 2013-07-27: qty 10

## 2013-07-27 MED ORDER — EPHEDRINE 5 MG/ML INJ
INTRAVENOUS | Status: AC
  Filled 2013-07-27: qty 4

## 2013-07-27 MED ORDER — IBUPROFEN 600 MG PO TABS
600.0000 mg | ORAL_TABLET | Freq: Four times a day (QID) | ORAL | Status: DC | PRN
  Administered 2013-07-27: 600 mg via ORAL
  Filled 2013-07-27: qty 1

## 2013-07-27 MED ORDER — OXYCODONE-ACETAMINOPHEN 5-325 MG PO TABS: 1.0000 | ORAL_TABLET | ORAL | Status: DC | PRN

## 2013-07-27 MED ORDER — METHYLERGONOVINE MALEATE 0.2 MG/ML IJ SOLN
INTRAMUSCULAR | Status: AC
  Administered 2013-07-27: 0.2 mg
  Filled 2013-07-27: qty 1

## 2013-07-27 MED ORDER — OXYTOCIN 40 UNITS IN LACTATED RINGERS INFUSION - SIMPLE MED
62.5000 mL/h | INTRAVENOUS | Status: DC
Start: 2013-07-27 — End: 2013-07-28
  Filled 2013-07-27: qty 1000

## 2013-07-27 MED ORDER — LIDOCAINE HCL (PF) 1 % IJ SOLN
INTRAMUSCULAR | Status: DC | PRN
  Administered 2013-07-27: 4 mL
  Administered 2013-07-27: 3 mL

## 2013-07-27 MED ORDER — PHENYLEPHRINE 40 MCG/ML (10ML) SYRINGE FOR IV PUSH (FOR BLOOD PRESSURE SUPPORT)
80.0000 ug | PREFILLED_SYRINGE | INTRAVENOUS | Status: DC | PRN
  Filled 2013-07-27: qty 2

## 2013-07-27 MED ORDER — FENTANYL 2.5 MCG/ML BUPIVACAINE 1/10 % EPIDURAL INFUSION (WH - ANES)
INTRAMUSCULAR | Status: AC
  Filled 2013-07-27: qty 125

## 2013-07-27 MED ORDER — MISOPROSTOL 200 MCG PO TABS
ORAL_TABLET | ORAL | Status: AC
  Filled 2013-07-27: qty 5

## 2013-07-27 MED ORDER — LACTATED RINGERS IV SOLN
500.0000 mL | Freq: Once | INTRAVENOUS | Status: AC
  Administered 2013-07-27: 500 mL via INTRAVENOUS

## 2013-07-27 MED ORDER — FENTANYL 2.5 MCG/ML BUPIVACAINE 1/10 % EPIDURAL INFUSION (WH - ANES)
INTRAMUSCULAR | Status: DC | PRN
  Administered 2013-07-27: 13 mL/h via EPIDURAL

## 2013-07-27 MED ORDER — LACTATED RINGERS IV SOLN
INTRAVENOUS | Status: DC
  Administered 2013-07-27 (×2): via INTRAVENOUS

## 2013-07-27 MED ORDER — LIDOCAINE HCL (PF) 1 % IJ SOLN
30.0000 mL | INTRAMUSCULAR | Status: DC | PRN
  Filled 2013-07-27: qty 30

## 2013-07-27 MED ORDER — ACETAMINOPHEN 325 MG PO TABS: 650.0000 mg | ORAL_TABLET | ORAL | Status: DC | PRN

## 2013-07-27 MED ORDER — OXYTOCIN BOLUS FROM INFUSION
500.0000 mL | INTRAVENOUS | Status: DC
  Administered 2013-07-27: 500 mL via INTRAVENOUS

## 2013-07-27 MED ORDER — ONDANSETRON HCL 4 MG/2ML IJ SOLN: 4.0000 mg | Freq: Four times a day (QID) | INTRAMUSCULAR | Status: DC | PRN

## 2013-07-27 NOTE — Anesthesia Procedure Notes (Signed)
Epidural Patient location during procedure: OB Start time: 07/27/2013 4:50 PM  Staffing Anesthesiologist: Cotton Beckley A. Performed by: anesthesiologist   Preanesthetic Checklist Completed: patient identified, site marked, surgical consent, pre-op evaluation, timeout performed, IV checked, risks and benefits discussed and monitors and equipment checked  Epidural Patient position: sitting Prep: site prepped and draped and DuraPrep Patient monitoring: continuous pulse ox and blood pressure Approach: midline Location: L3-L4 Injection technique: LOR air  Needle:  Needle type: Tuohy  Needle gauge: 17 G Needle length: 9 cm and 9 Needle insertion depth: 5 cm cm Catheter type: closed end flexible Catheter size: 19 Gauge Catheter at skin depth: 10 cm Test dose: negative and Other  Assessment Events: blood not aspirated, injection not painful, no injection resistance, negative IV test and no paresthesia  Additional Notes Patient identified. Risks and benefits discussed including failed block, incomplete  Pain control, post dural puncture headache, nerve damage, paralysis, blood pressure Changes, nausea, vomiting, reactions to medications-both toxic and allergic and post Partum back pain. All questions were answered. Patient expressed understanding and wished to proceed. Sterile technique was used throughout procedure. Epidural site was Dressed with sterile barrier dressing. No paresthesias, signs of intravascular injection Or signs of intrathecal spread were encountered.  Patient was more comfortable after the epidural was dosed. Please see RN's note for documentation of vital signs and FHR which are stable.

## 2013-07-27 NOTE — H&P (Signed)
LABOR ADMISSION HISTORY AND PHYSICAL  Alicia Riley is a 33 y.o. female G2P1001 with IUP at 3857w3d presenting for active labor. Started having contractions earlier this AM. Every 5 min; some bloody show. No lof. +FM.   [facility]. Initial care in [place].  Then transferred here.    Prenatal History/Complications:  Past Medical History: Past Medical History  Diagnosis Date  . Medical history non-contributory     Past Surgical History: Past Surgical History  Procedure Laterality Date  . No past surgeries      Obstetrical History: OB History   Grav Para Term Preterm Abortions TAB SAB Ect Mult Living   2 1 1       1      Social History: History   Social History  . Marital Status: Married    Spouse Name: N/A    Number of Children: N/A  . Years of Education: N/A   Social History Main Topics  . Smoking status: Never Smoker   . Smokeless tobacco: Never Used  . Alcohol Use: No  . Drug Use: No  . Sexual Activity: Not Currently   Other Topics Concern  . None   Social History Narrative  . None    Family History: History reviewed. No pertinent family history.  Allergies: No Known Allergies  Prescriptions prior to admission  Medication Sig Dispense Refill  . Prenatal Vit-Fe Fumarate-FA (PRENATAL VITAMINS PLUS) 27-1 MG TABS Take 1 tablet by mouth daily.         Review of Systems   All systems reviewed and negative except as stated in HPI  Blood pressure 117/61, pulse 68, temperature 97.9 F (36.6 C), temperature source Oral, resp. rate 20, height 5\' 2"  (1.575 m), weight 62.143 kg (137 lb), last menstrual period 11/02/2012, SpO2 100.00%. General appearance: alert, cooperative and no distress Lungs: clear to auscultation bilaterally Heart: regular rate and rhythm Abdomen: soft, non-tender; bowel sounds normal Extremities: Homans sign is negative, no sign of DVT  Presentation: cephalic Fetal monitoringBaseline: 135 bpm, Variability: Good {> 6 bpm),  Accelerations: Reactive and Decelerations: Variable: occasionally Uterine activity every 2-693min  Dilation: 8.5 Effacement (%): 90 Station: -2;-1 Exam by:: Lorretta Harp. Brown RNC   Prenatal labs: ABO, Rh: --/--/A POS (05/04 1600) Antibody: PENDING (05/04 1600) Rubella:   RPR: NON REAC (02/16 1148)  HBsAg: NEGATIVE (02/16 1148)  HIV: NON REACTIVE (02/16 1148)  GBS: Negative (04/08 0000)  1 hr Glucola 123 Anatomy US normal   Results for orders placed during the hospital encounter of 07/27/13 (from the past 24 hour(s))  CBC   Collection Time    07/27/13  4:00 PM      Result Value Ref Range   WBC 11.4 (*) 4.0 - 10.5 K/uL   RBC 3.71 (*) 3.87 - 5.11 MIL/uL   Hemoglobin 11.0 (*) 12.0 - 15.0 g/dL   HCT 16.133.0 (*) 09.636.0 - 04.546.0 %   MCV 88.9  78.0 - 100.0 fL   MCH 29.6  26.0 - 34.0 pg   MCHC 33.3  30.0 - 36.0 g/dL   RDW 40.914.4  81.111.5 - 91.415.5 %   Platelets 161  150 - 400 K/uL  TYPE AND SCREEN   Collection Time    07/27/13  4:00 PM      Result Value Ref Range   ABO/RH(D) A POS     Antibody Screen PENDING     Sample Expiration 07/30/2013      Assessment: Alicia Riley is a 33 y.o. G2P1001  at 2219w3d here for active labor at term.   #Labor:cont expectant management  #Pain: epidural #FWB: Cat II tracing, gbs neg.  #ID:  neg #MOF: breast #MOC: undecided #Circ:  na  [name] 07/27/2013, 6:10 PM

## 2013-07-27 NOTE — MAU Note (Signed)
Patient and her husband state she is having contractions every 5 minutes with bloody show and a little leaking. Reports good fetal movement.

## 2013-07-27 NOTE — Anesthesia Preprocedure Evaluation (Signed)

## 2013-07-28 ENCOUNTER — Encounter (HOSPITAL_COMMUNITY): Payer: Self-pay | Admitting: *Deleted

## 2013-07-28 ENCOUNTER — Encounter: Payer: Self-pay | Admitting: Obstetrics & Gynecology

## 2013-07-28 LAB — CBC
HCT: 32.7 % — ABNORMAL LOW (ref 36.0–46.0)
Hemoglobin: 10.9 g/dL — ABNORMAL LOW (ref 12.0–15.0)
MCH: 29.5 pg (ref 26.0–34.0)
MCHC: 33.3 g/dL (ref 30.0–36.0)
MCV: 88.6 fL (ref 78.0–100.0)
PLATELETS: 149 10*3/uL — AB (ref 150–400)
RBC: 3.69 MIL/uL — ABNORMAL LOW (ref 3.87–5.11)
RDW: 14.4 % (ref 11.5–15.5)
WBC: 17.7 10*3/uL — AB (ref 4.0–10.5)

## 2013-07-28 MED ORDER — ONDANSETRON HCL 4 MG PO TABS: 4.0000 mg | ORAL_TABLET | ORAL | Status: DC | PRN

## 2013-07-28 MED ORDER — LANOLIN HYDROUS EX OINT: TOPICAL_OINTMENT | CUTANEOUS | Status: DC | PRN

## 2013-07-28 MED ORDER — TETANUS-DIPHTH-ACELL PERTUSSIS 5-2.5-18.5 LF-MCG/0.5 IM SUSP: 0.5000 mL | Freq: Once | INTRAMUSCULAR | Status: DC

## 2013-07-28 MED ORDER — WITCH HAZEL-GLYCERIN EX PADS: 1.0000 "application " | MEDICATED_PAD | CUTANEOUS | Status: DC | PRN

## 2013-07-28 MED ORDER — BENZOCAINE-MENTHOL 20-0.5 % EX AERO: 1.0000 "application " | INHALATION_SPRAY | CUTANEOUS | Status: DC | PRN

## 2013-07-28 MED ORDER — SIMETHICONE 80 MG PO CHEW: 80.0000 mg | CHEWABLE_TABLET | ORAL | Status: DC | PRN

## 2013-07-28 MED ORDER — OXYCODONE-ACETAMINOPHEN 5-325 MG PO TABS: 1.0000 | ORAL_TABLET | ORAL | Status: DC | PRN

## 2013-07-28 MED ORDER — DIPHENHYDRAMINE HCL 25 MG PO CAPS: 25.0000 mg | ORAL_CAPSULE | Freq: Four times a day (QID) | ORAL | Status: DC | PRN

## 2013-07-28 MED ORDER — DIBUCAINE 1 % RE OINT: 1.0000 "application " | TOPICAL_OINTMENT | RECTAL | Status: DC | PRN

## 2013-07-28 MED ORDER — PRENATAL MULTIVITAMIN CH
1.0000 | ORAL_TABLET | Freq: Every day | ORAL | Status: DC
  Administered 2013-07-28 – 2013-07-29 (×2): 1 via ORAL
  Filled 2013-07-28 (×2): qty 1

## 2013-07-28 MED ORDER — IBUPROFEN 600 MG PO TABS
600.0000 mg | ORAL_TABLET | Freq: Four times a day (QID) | ORAL | Status: DC
  Administered 2013-07-28 – 2013-07-29 (×6): 600 mg via ORAL
  Filled 2013-07-28 (×6): qty 1

## 2013-07-28 MED ORDER — ZOLPIDEM TARTRATE 5 MG PO TABS: 5.0000 mg | ORAL_TABLET | Freq: Every evening | ORAL | Status: DC | PRN

## 2013-07-28 MED ORDER — SENNOSIDES-DOCUSATE SODIUM 8.6-50 MG PO TABS
2.0000 | ORAL_TABLET | ORAL | Status: DC
Start: 2013-07-28 — End: 2013-07-29
  Administered 2013-07-28 – 2013-07-29 (×2): 2 via ORAL
  Filled 2013-07-28 (×2): qty 2

## 2013-07-28 MED ORDER — ONDANSETRON HCL 4 MG/2ML IJ SOLN: 4.0000 mg | INTRAMUSCULAR | Status: DC | PRN

## 2013-07-28 NOTE — Progress Notes (Signed)
Post Partum Day #1 Subjective: Pt is overall feeling well. Pain well controlled with motrin.  Lochia rubra decreasing in amount. Ambulating. Tolerating PO.  Having some difficulty breastfeeding, would like lactation consult.  Objective: Blood pressure 91/53, pulse 70, temperature 98.1 F (36.7 C), temperature source Oral, resp. rate 18, height 5\' 2"  (1.575 m), weight 62.143 kg (137 lb), last menstrual period 11/02/2012, SpO2 97.00%, unknown if currently breastfeeding.  Physical Exam:  General: alert and no distress CV: RRR, No MRG Resp: CTA BL Lochia: appropriate Uterine Fundus: firm, below umbilicus Incision: NA DVT Evaluation: No evidence of DVT seen on physical exam. Negative Homan's sign.   Recent Labs  07/27/13 1600 07/28/13 0625  HGB 11.0* 10.9*  HCT 33.0* 32.7*    Assessment/Plan: 33 yo G2 now P2, PPD#1 NSVD at 22:47  - Continue routine postpartum care - Breastfeeding, Lactation consult today - Likely d/c in the morning if continues to do well    LOS: 1 day   Myriam JacobsonRobyn H Restrepo 07/28/2013, 8:01 AM   I examined pt and agree with documentation above and resident plan of care. Eino FarberWalidah Paul HalfN Muhammad

## 2013-07-28 NOTE — Anesthesia Postprocedure Evaluation (Signed)
Anesthesia Post Note  Patient: Alicia Riley  Procedure(s) Performed: * No procedures listed *  Anesthesia type: Epidural  Patient location: Mother/Baby  Post pain: Pain level controlled  Post assessment: Post-op Vital signs reviewed  Last Vitals:  Filed Vitals:   07/28/13 0535  BP: 91/53  Pulse: 70  Temp: 36.7 C  Resp: 18    Post vital signs: Reviewed  Level of consciousness: awake  Complications: No apparent anesthesia complications

## 2013-07-28 NOTE — Progress Notes (Signed)
UR chart review completed.  

## 2013-07-28 NOTE — Lactation Note (Signed)
This note was copied from the chart of Alicia Riley. Lactation Consultation Note: Initial visit with this experienced BF mom. [place] interpreter#[id number] assisted with visit. Mom reports that it hurts while the baby is nursing. Assisted mom with latch in football hold. Baby latched well, mom needed some assistance with positioning. Mom reports some pain with latch but "not bad" Encouraged wide open mouth and keeping the baby close to the breast throughout the feeding. No questions at present. To call for assist prn  Patient Name: Alicia Riley Today's Date: 07/28/2013 Reason for consult: Initial assessment   Maternal Data Formula Feeding for Exclusion: No Infant to breast within first hour of birth: Yes Does the patient have breastfeeding experience prior to this delivery?: Yes  Feeding Feeding Type: Breast Fed  LATCH Score/Interventions Latch: Grasps breast easily, tongue down, lips flanged, rhythmical sucking.  Audible Swallowing: Spontaneous and intermittent  Type of Nipple: Everted at rest and after stimulation  Comfort (Breast/Nipple): Soft / non-tender     Hold (Positioning): Assistance needed to correctly position infant at breast and maintain latch. Intervention(s): Breastfeeding basics reviewed;Support Pillows  LATCH Score: 9  Lactation Tools Discussed/Used     Consult Status Consult Status: Follow-up Date: 07/29/13 Follow-up type: In-patient    Pamelia HoitDonna D Chastity Noland 07/28/2013, 12:22 PM

## 2013-07-29 MED ORDER — DOCUSATE SODIUM 100 MG PO CAPS: 100.0000 mg | ORAL_CAPSULE | Freq: Two times a day (BID) | ORAL | Status: DC | PRN

## 2013-07-29 MED ORDER — IBUPROFEN 600 MG PO TABS: 600.0000 mg | ORAL_TABLET | Freq: Four times a day (QID) | ORAL | Status: DC

## 2013-07-29 NOTE — Discharge Instructions (Signed)
Vaginal Delivery °Care After °Refer to this sheet in the next few weeks. These discharge instructions provide you with information on caring for yourself after delivery. Your caregiver may also give you specific instructions. Your treatment has been planned according to the most current medical practices available, but problems sometimes occur. Call your caregiver if you have any problems or questions after you go home. °HOME CARE INSTRUCTIONS °· Take over-the-counter or prescription medicines only as directed by your caregiver or pharmacist. °· Do not drink alcohol, especially if you are breastfeeding or taking medicine to relieve pain. °· Do not chew or smoke tobacco. °· Do not use illegal drugs. °· Continue to use good perineal care. Good perineal care includes: °· Wiping your perineum from front to back. °· Keeping your perineum clean. °· Do not use tampons or douche until your caregiver says it is okay. °· Shower, wash your hair, and take tub baths as directed by your caregiver. °· Wear a well-fitting bra that provides breast support. °· Eat healthy foods. °· Drink enough fluids to keep your urine clear or pale yellow. °· Eat high-fiber foods such as whole grain cereals and breads, brown rice, beans, and fresh fruits and vegetables every day. These foods may help prevent or relieve constipation. °· Follow your cargiver's recommendations regarding resumption of activities such as climbing stairs, driving, lifting, exercising, or traveling. °· Talk to your caregiver about resuming sexual activities. Resumption of sexual activities is dependent upon your risk of infection, your rate of healing, and your comfort and desire to resume sexual activity. °· Try to have someone help you with your household activities and your newborn for at least a few days after you leave the hospital. °· Rest as much as possible. Try to rest or take a nap when your newborn is sleeping. °· Increase your activities gradually. °· Keep all  of your scheduled postpartum appointments. It is very important to keep your scheduled follow-up appointments. At these appointments, your caregiver will be checking to make sure that you are healing physically and emotionally. °SEEK MEDICAL CARE IF:  °· You are passing large clots from your vagina. Save any clots to show your caregiver. °· You have a foul smelling discharge from your vagina. °· You have trouble urinating. °· You are urinating frequently. °· You have pain when you urinate. °· You have a change in your bowel movements. °· You have increasing redness, pain, or swelling near your vaginal incision (episiotomy) or vaginal tear. °· You have pus draining from your episiotomy or vaginal tear. °· Your episiotomy or vaginal tear is separating. °· You have painful, hard, or reddened breasts. °· You have a severe headache. °· You have blurred vision or see spots. °· You feel sad or depressed. °· You have thoughts of hurting yourself or your newborn. °· You have questions about your care, the care of your newborn, or medicines. °· You are dizzy or lightheaded. °· You have a rash. °· You have nausea or vomiting. °· You were breastfeeding and have not had a menstrual period within 12 weeks after you stopped breastfeeding. °· You are not breastfeeding and have not had a menstrual period by the 12th week after delivery. °· You have a fever. °SEEK IMMEDIATE MEDICAL CARE IF:  °· You have persistent pain. °· You have chest pain. °· You have shortness of breath. °· You faint. °· You have leg pain. °· You have stomach pain. °· Your vaginal bleeding saturates two or more sanitary pads   in 1 hour. °MAKE SURE YOU:  °· Understand these instructions. °· Will watch your condition. °· Will get help right away if you are not doing well or get worse. °Document Released: 03/09/2000 Document Revised: 12/05/2011 Document Reviewed: 11/07/2011 °ExitCare® Patient Information ©2014 [employer]. °Breastfeeding °Deciding to breastfeed is  one of the best choices you can make for you and your baby. A change in hormones during pregnancy causes your breast tissue to grow and increases the number and size of your milk ducts. These hormones also allow proteins, sugars, and fats from your blood supply to make breast milk in your milk-producing glands. Hormones prevent breast milk from being released before your baby is born as well as prompt milk flow after birth. Once breastfeeding has begun, thoughts of your baby, as well as his or her sucking or crying, can stimulate the release of milk from your milk-producing glands.  °BENEFITS OF BREASTFEEDING °For Your Baby °· Your first milk (colostrum) helps your baby's digestive system function better.   °· There are antibodies in your milk that help your baby fight off infections.   °· Your baby has a lower incidence of asthma, allergies, and sudden infant death syndrome.   °· The nutrients in breast milk are better for your baby than infant formulas and are designed uniquely for your baby's needs.   °· Breast milk improves your baby's brain development.   °· Your baby is less likely to develop other conditions, such as childhood obesity, asthma, or type 2 diabetes mellitus.   °For You  °· Breastfeeding helps to create a very special bond between you and your baby.   °· Breastfeeding is convenient. Breast milk is always available at the correct temperature and costs nothing.   °· Breastfeeding helps to burn calories and helps you lose the weight gained during pregnancy.   °· Breastfeeding makes your uterus contract to its prepregnancy size faster and slows bleeding (lochia) after you give birth.   °· Breastfeeding helps to lower your risk of developing type 2 diabetes mellitus, osteoporosis, and breast or ovarian cancer later in life. °SIGNS THAT YOUR BABY IS HUNGRY °Early Signs of Hunger  °· Increased alertness or activity. °· Stretching. °· Movement of the head from side to side. °· Movement of the head and  opening of the mouth when the corner of the mouth or cheek is stroked (rooting). °· Increased sucking sounds, smacking lips, cooing, sighing, or squeaking. °· Hand-to-mouth movements. °· Increased sucking of fingers or hands. °Late Signs of Hunger °· Fussing. °· Intermittent crying. °Extreme Signs of Hunger °Signs of extreme hunger will require calming and consoling before your baby will be able to breastfeed successfully. Do not wait for the following signs of extreme hunger to occur before you initiate breastfeeding:   °· Restlessness. °· A loud, strong cry. °·  Screaming. °BREASTFEEDING BASICS °Breastfeeding Initiation °· Find a comfortable place to sit or lie down, with your neck and back well supported. °· Place a pillow or rolled up blanket under your baby to bring him or her to the level of your breast (if you are seated). Nursing pillows are specially designed to help support your arms and your baby while you breastfeed. °· Make sure that your baby's abdomen is facing your abdomen.   °· Gently massage your breast. With your fingertips, massage from your chest wall toward your nipple in a circular motion. This encourages milk flow. You may need to continue this action during the feeding if your milk flows slowly. °· Support your breast with 4 fingers underneath and   your thumb above your nipple. Make sure your fingers are well away from your nipple and your baby's mouth.   °· Stroke your baby's lips gently with your finger or nipple.   °· When your baby's mouth is open wide enough, quickly bring your baby to your breast, placing your entire nipple and as much of the colored area around your nipple (areola) as possible into your baby's mouth.   °· More areola should be visible above your baby's upper lip than below the lower lip.   °· Your baby's tongue should be between his or her lower gum and your breast.   °· Ensure that your baby's mouth is correctly positioned around your nipple (latched). Your baby's  lips should create a seal on your breast and be turned out (everted). °· It is common for your baby to suck about 2 3 minutes in order to start the flow of breast milk. °Latching °Teaching your baby how to latch on to your breast properly is very important. An improper latch can cause nipple pain and decreased milk supply for you and poor weight gain in your baby. Also, if your baby is not latched onto your nipple properly, he or she may swallow some air during feeding. This can make your baby fussy. Burping your baby when you switch breasts during the feeding can help to get rid of the air. However, teaching your baby to latch on properly is still the best way to prevent fussiness from swallowing air while breastfeeding. °Signs that your baby has successfully latched on to your nipple:    °· Silent tugging or silent sucking, without causing you pain.   °· Swallowing heard between every 3 4 sucks.   °·  Muscle movement above and in front of his or her ears while sucking.   °Signs that your baby has not successfully latched on to nipple:  °· Sucking sounds or smacking sounds from your baby while breastfeeding. °· Nipple pain. °If you think your baby has not latched on correctly, slip your finger into the corner of your baby's mouth to break the suction and place it between your baby's gums. Attempt breastfeeding initiation again. °Signs of Successful Breastfeeding °Signs from your baby:   °· A gradual decrease in the number of sucks or complete cessation of sucking.   °· Falling asleep.   °· Relaxation of his or her body.   °· Retention of a small amount of milk in his or her mouth.   °· Letting go of your breast by himself or herself. °Signs from you: °· Breasts that have increased in firmness, weight, and size 1 3 hours after feeding.   °· Breasts that are softer immediately after breastfeeding. °· Increased milk volume, as well as a change in milk consistency and color by the 5th day of breastfeeding.   °· Nipples  that are not sore, cracked, or bleeding. °Signs That Your Baby is Getting Enough Milk °· Wetting at least 3 diapers in a 24-hour period. The urine should be clear and pale yellow by age 5 days. °· At least 3 stools in a 24-hour period by age 5 days. The stool should be soft and yellow. °· At least 3 stools in a 24-hour period by age 7 days. The stool should be seedy and yellow. °· No loss of weight greater than 10% of birth weight during the first 3 days of age. °· Average weight gain of 4 7 ounces (120 210 mL) per week after age 4 days. °· Consistent daily weight gain by age 5 days, without weight loss after   the age of 2 weeks. °After a feeding, your baby may spit up a small amount. This is common. °BREASTFEEDING FREQUENCY AND DURATION °Frequent feeding will help you make more milk and can prevent sore nipples and breast engorgement. Breastfeed when you feel the need to reduce the fullness of your breasts or when your baby shows signs of hunger. This is called "breastfeeding on demand." Avoid introducing a pacifier to your baby while you are working to establish breastfeeding (the first 4 6 weeks after your baby is born). After this time you may choose to use a pacifier. Research has shown that pacifier use during the first year of a baby's life decreases the risk of sudden infant death syndrome (SIDS). °Allow your baby to feed on each breast as long as he or she wants. Breastfeed until your baby is finished feeding. When your baby unlatches or falls asleep while feeding from the first breast, offer the second breast. Because newborns are often sleepy in the first few weeks of life, you may need to awaken your baby to get him or her to feed. °Breastfeeding times will vary from baby to baby. However, the following rules can serve as a guide to help you ensure that your baby is properly fed: °· Newborns (babies 4 weeks of age or younger) may breastfeed every 1 3 hours. °· Newborns should not go longer than 3 hours  during the day or 5 hours during the night without breastfeeding. °· You should breastfeed your baby a minimum of 8 times in a 24-hour period until you begin to introduce solid foods to your baby at around 6 months of age. °BREAST MILK PUMPING °Pumping and storing breast milk allows you to ensure that your baby is exclusively fed your breast milk, even at times when you are unable to breastfeed. This is especially important if you are going back to work while you are still breastfeeding or when you are not able to be present during feedings. Your lactation consultant can give you guidelines on how long it is safe to store breast milk.  °A breast pump is a machine that allows you to pump milk from your breast into a sterile bottle. The pumped breast milk can then be stored in a refrigerator or freezer. Some breast pumps are operated by hand, while others use electricity. Ask your lactation consultant which type will work best for you. Breast pumps can be purchased, but some hospitals and breastfeeding support groups lease breast pumps on a monthly basis. A lactation consultant can teach you how to hand express breast milk, if you prefer not to use a pump.  °CARING FOR YOUR BREASTS WHILE YOU BREASTFEED °Nipples can become dry, cracked, and sore while breastfeeding. The following recommendations can help keep your breasts moisturized and healthy: °· Avoid using soap on your nipples.   °· Wear a supportive bra. Although not required, special nursing bras and tank tops are designed to allow access to your breasts for breastfeeding without taking off your entire bra or top. Avoid wearing underwire style bras or extremely tight bras. °· Air dry your nipples for 3 4 minutes after each feeding.   °· Use only cotton bra pads to absorb leaked breast milk. Leaking of breast milk between feedings is normal.   °· Use lanolin on your nipples after breastfeeding. Lanolin helps to maintain your skin's normal moisture barrier. If you  use pure lanolin you do not need to wash it off before feeding your baby again. Pure lanolin is not toxic   to your baby. You may also hand express a few drops of breast milk and gently massage that milk into your nipples and allow the milk to air dry. °In the first few weeks after giving birth, some women experience extremely full breasts (engorgement). Engorgement can make your breasts feel heavy, warm, and tender to the touch. Engorgement peaks within 3 5 days after you give birth. The following recommendations can help ease engorgement: °· Completely empty your breasts while breastfeeding or pumping. You may want to start by applying warm, moist heat (in the shower or with warm water-soaked hand towels) just before feeding or pumping. This increases circulation and helps the milk flow. If your baby does not completely empty your breasts while breastfeeding, pump any extra milk after he or she is finished. °· Wear a snug bra (nursing or regular) or tank top for 1 2 days to signal your body to slightly decrease milk production. °· Apply ice packs to your breasts, unless this is too uncomfortable for you. °· Make sure that your baby is latched on and positioned properly while breastfeeding. °If engorgement persists after 48 hours of following these recommendations, contact your health care provider or a lactation consultant. °OVERALL HEALTH CARE RECOMMENDATIONS WHILE BREASTFEEDING °· Eat healthy foods. Alternate between meals and snacks, eating 3 of each per day. Because what you eat affects your breast milk, some of the foods may make your baby more irritable than usual. Avoid eating these foods if you are sure that they are negatively affecting your baby. °· Drink milk, fruit juice, and water to satisfy your thirst (about 10 glasses a day).   °· Rest often, relax, and continue to take your prenatal vitamins to prevent fatigue, stress, and anemia. °· Continue breast self-awareness checks. °· Avoid chewing and  smoking tobacco. °· Avoid alcohol and drug use. °Some medicines that may be harmful to your baby can pass through breast milk. It is important to ask your health care provider before taking any medicine, including all over-the-counter and prescription medicine as well as vitamin and herbal supplements. °It is possible to become pregnant while breastfeeding. If birth control is desired, ask your health care provider about options that will be safe for your baby. °SEEK MEDICAL CARE IF:  °· You feel like you want to stop breastfeeding or have become frustrated with breastfeeding. °· You have painful breasts or nipples. °· Your nipples are cracked or bleeding. °· Your breasts are red, tender, or warm. °· You have a swollen area on either breast. °· You have a fever or chills. °· You have nausea or vomiting. °· You have drainage other than breast milk from your nipples. °· Your breasts do not become full before feedings by the 5th day after you give birth. °· You feel sad and depressed. °· Your baby is too sleepy to eat well. °· Your baby is having trouble sleeping.   °· Your baby is wetting less than 3 diapers in a 24-hour period. °· Your baby has less than 3 stools in a 24-hour period. °· Your baby's skin or the white part of his or her eyes becomes yellow.   °· Your baby is not gaining weight by 5 days of age. °SEEK IMMEDIATE MEDICAL CARE IF:  °· Your baby is overly tired (lethargic) and does not want to wake up and feed. °· Your baby develops an unexplained fever. °Document Released: 03/12/2005 Document Revised: 11/12/2012 Document Reviewed: 09/03/2012 °ExitCare® Patient Information ©2014 [employer]. ° °

## 2013-07-29 NOTE — Discharge Summary (Signed)
Attestation of Attending Supervision of Advanced Practitioner (PA/CNM/NP): Evaluation and management procedures were performed by the Advanced Practitioner under my supervision and collaboration.  I have reviewed the Advanced Practitioner's note and chart, and I agree with the management and plan.  Clarrisa Kaylor, MD, FACOG Attending Obstetrician & Gynecologist Faculty Practice, [facility]  

## 2013-07-29 NOTE — Discharge Summary (Signed)
Obstetric Discharge Summary Reason for Admission: onset of labor Prenatal Procedures: ultrasound Intrapartum Procedures: spontaneous vaginal delivery Postpartum Procedures: none Complications-Operative and Postpartum: none Hemoglobin  Date Value Ref Range Status  07/28/2013 10.9* 12.0 - 15.0 g/dL Final     HCT  Date Value Ref Range Status  07/28/2013 32.7* 36.0 - 46.0 % Final    Physical Exam:  General: alert, cooperative and no distress Lochia: appropriate Uterine Fundus: firm Incision: n/a DVT Evaluation: No evidence of DVT seen on physical exam. Negative Homan's sign. No cords or calf tenderness.  Discharge Diagnoses: Term Pregnancy-delivered  Discharge Information: Date: 07/29/2013 Activity: pelvic rest Diet: routine Medications: Ibuprofen and Colace Condition: stable Instructions: refer to practice specific booklet Discharge to: home Follow-up Information   [facility]   Specialty:  Obstetrics and Gynecology   Contact information:   [address] [place] [address] [phone]      Newborn Data: Live born female  Birth Weight: 7 lb 12.2 oz (3520 g) APGAR: 9, 9  Home with mother.  Alicia StanleyLisa A Riley 07/29/2013, 7:45 AM

## 2013-07-29 NOTE — Discharge Summary (Signed)
Obstetric Discharge Summary Reason for Admission: onset of labor Prenatal Procedures: none Intrapartum Procedures: spontaneous vaginal delivery Postpartum Procedures: none Complications-Operative and Postpartum: Second degree perineal laceration and repaired with 3-0 monocryl Hemoglobin  Date Value Ref Range Status  07/28/2013 10.9* 12.0 - 15.0 g/dL Final     HCT  Date Value Ref Range Status  07/28/2013 32.7* 36.0 - 46.0 % Final    Physical Exam:  General: alert and no distress CV: RRR, No MRG Lungs: CTA BL Lochia: appropriate Uterine Fundus: firm, below the umbilicus Incision: NA DVT Evaluation: No evidence of DVT seen on physical exam. Negative Homan's sign.  Discharge Diagnoses: Term Pregnancy-delivered  Hospital Course: Tamela OddiMai Breana is a 33 y.o. female G2 now P1001 admitted at 3347w3d  for active labor.  At 10:47 PM a viable female was delivered via Vaginal, Spontaneous Delivery (Presentation: Left Occiput Anterior). APGAR: 9, 9; weight 7 lb 12.2 oz (3520 g).  Placenta status: Intact, Spontaneous. Cord: 3 vessels with the following complications: None. Cord pH: NA  Anesthesia: Epidural  Episiotomy: None  Lacerations: 2nd degree  Suture Repair: 3.0 monocryl on CT  Est. Blood Loss (mL): 450  Mom to postpartum. Baby to Couplet care / Skin to Skin.  32yo G2P1001 at 4347w3d delivered a viable female infant over an intact perineum with a second degree laceration. Nuchal cord reduced. Active management of third stage of labor, pitocin given. Placenta delivered spontaneously, 3 vessel cord, intact placenta. Methergine given for persistent bleeding. Second degree laceration repaired with 3-0 monocryl. EBL 450ml.  Patient was stable during the postpartum period.  She was discharged home on on PPD#1. Breastfeeding.  Plans nexplanon versus OCPs for contraception.    Discharge Information: Date: 07/29/2013 Activity: pelvic rest Diet: routine Medications: Ibuprofen Condition:  stable Instructions: refer to practice specific booklet Discharge to: home   Newborn Data: Live born female  Birth Weight: 7 lb 12.2 oz (3520 g) APGAR: 9, 9  Home with mother.  Myriam JacobsonRobyn H Restrepo 07/29/2013, 6:50 AM  I have seen this patient and agree with the above resident's note.  LEFTWICH-KIRBY, Noralee Dutko Certified Nurse-Midwife

## 2013-08-19 ENCOUNTER — Encounter: Payer: Self-pay | Admitting: General Practice

## 2013-09-07 ENCOUNTER — Encounter: Payer: Self-pay | Admitting: Family Medicine

## 2013-09-07 ENCOUNTER — Ambulatory Visit (INDEPENDENT_AMBULATORY_CARE_PROVIDER_SITE_OTHER): Payer: Medicaid Other | Admitting: Family Medicine

## 2013-09-07 VITALS — BP 109/67 | HR 68 | Temp 97.2°F | Wt 119.9 lb

## 2013-09-07 DIAGNOSIS — Z8619 Personal history of other infectious and parasitic diseases: Secondary | ICD-10-CM

## 2013-09-07 DIAGNOSIS — Z01812 Encounter for preprocedural laboratory examination: Secondary | ICD-10-CM

## 2013-09-07 LAB — POCT PREGNANCY, URINE: PREG TEST UR: NEGATIVE

## 2013-09-07 MED ORDER — NORGESTIMATE-ETH ESTRADIOL 0.25-35 MG-MCG PO TABS: 1.0000 | ORAL_TABLET | Freq: Every day | ORAL | Status: DC

## 2013-09-07 NOTE — Addendum Note (Signed)
Addended by: Candelaria StagersHAIZLIP, Janisha Bueso E on: 09/07/2013 04:58 PM   Modules accepted: Orders

## 2013-09-07 NOTE — Progress Notes (Signed)
Patient here for PP visit. Is breastfeeding. Would like to start birth control pills as method of contraception. UPT obtained.

## 2013-09-07 NOTE — Progress Notes (Signed)
Patient ID: Alicia Riley, female   DOB: 21-May-1980, 33 y.o.   MRN: [id number] Subjective:     Alicia OddiMai Leinani is a [age] y.o. female who presents for a postpartum visit. She is 5 weeks postpartum following a spontaneous vaginal delivery. I have fully reviewed the prenatal and intrapartum course. The delivery was at 40+3 gestational weeks. Outcome: spontaneous vaginal delivery. Anesthesia: none. Postpartum course has been uncomplicated. Baby's course has been uncomplicated. Baby is feeding by both breast and bottle - Gerber. Bleeding no bleeding. Bowel function is normal. Bladder function is normal. Patient is not sexually active. Contraception method is Nexplanon. Postpartum depression screening: negative.  The following portions of the patient's history were reviewed and updated as appropriate: allergies, current medications, past family history, past medical history, past social history, past surgical history and problem list.  Review of Systems Pertinent items are noted in HPI.   Objective:    BP 109/67  Pulse 68  Temp(Src) 97.2 F (36.2 C) (Oral)  Wt 54.386 kg (119 lb 14.4 oz)  Breastfeeding? Yes  General:  alert, cooperative, appears stated age and no distress   Breasts:  inspection negative, no nipple discharge or bleeding, no masses or nodularity palpable, small palpable bump on lateral right breast  Lungs: clear to auscultation bilaterally and normal percussion bilaterally  Heart:  regular rate and rhythm, S1, S2 normal, no murmur, click, rub or gallop  Abdomen: soft, non-tender; bowel sounds normal; no masses,  no organomegaly   Vulva:  normal  Vagina: normal vagina, no discharge, exudate, lesion, or erythema  Cervix:  Not examined  Corpus: not examined  Adnexa:  not evaluated  Rectal Exam: Not performed.        Assessment:     6 postpartum exam. Pap smear not done at today's visit.   Plan:    1. Contraception: OCP (estrogen/progesterone) 2. Well healed tear, TOC for Chlamydia today 3.  Follow up in: as needed.

## 2013-09-07 NOTE — Patient Instructions (Signed)
[facility] [address] [place], [place] [address] [phone]   [address]  [place], [place] [address] [phone]   [address]  [place], [place] [phone]   Postpartum Care After Vaginal Delivery After you deliver your newborn (postpartum period), the usual stay in the hospital is 24 72 hours. If there were problems with your labor or delivery, or if you have other medical problems, you might be in the hospital longer.  While you are in the hospital, you will receive help and instructions on how to care for yourself and your newborn during the postpartum period.  While you are in the hospital:  Be sure to tell your nurses if you have pain or discomfort, as well as where you feel the pain and what makes the pain worse.  If you had an incision made near your vagina (episiotomy) or if you had some tearing during delivery, the nurses may put ice packs on your episiotomy or tear. The ice packs may help to reduce the pain and swelling.  If you are breastfeeding, you may feel uncomfortable contractions of your uterus for a couple of weeks. This is normal. The contractions help your uterus get back to normal size.  It is normal to have some bleeding after delivery.  For the first 1 3 days after delivery, the flow is red and the amount may be similar to a period.  It is common for the flow to start and stop.  In the first few days, you may pass some small clots. Let your nurses know if you begin to pass large clots or your flow increases.  Do not  flush blood clots down the toilet before having the nurse look at them.  During the next 3 10 days after delivery, your flow should become more watery and pink or brown-tinged in color.  Ten to fourteen days after delivery, your flow should be a small amount of yellowish-white discharge.  The amount of your flow will decrease over the first few weeks after delivery. Your flow may stop in 6 8  weeks. Most women have had their flow stop by 12 weeks after delivery.  You should change your sanitary pads frequently.  Wash your hands thoroughly with soap and water for at least 20 seconds after changing pads, using the toilet, or before holding or feeding your newborn.  You should feel like you need to empty your bladder within the first 6 8 hours after delivery.  In case you become weak, lightheaded, or faint, call your nurse before you get out of bed for the first time and before you take a shower for the first time.  Within the first few days after delivery, your breasts may begin to feel tender and full. This is called engorgement. Breast tenderness usually goes away within 48 72 hours after engorgement occurs. You may also notice milk leaking from your breasts. If you are not breastfeeding, do not stimulate your breasts. Breast stimulation can make your breasts produce more milk.  Spending as much time as possible with your newborn is very important. During this time, you and your newborn can feel close and get to know each other. Having your newborn stay in your room (rooming in) will help to strengthen the bond with your newborn. It will give you time to get to know your newborn and become comfortable caring for your newborn.  Your hormones change after delivery. Sometimes the hormone changes can temporarily  cause you to feel sad or tearful. These feelings should not last more than a few days. If these feelings last longer than that, you should talk to your caregiver.  If desired, talk to your caregiver about methods of family planning or contraception.  Talk to your caregiver about immunizations. Your caregiver may want you to have the following immunizations before leaving the hospital:  Tetanus, diphtheria, and pertussis (Tdap) or tetanus and diphtheria (Td) immunization. It is very important that you and your family (including grandparents) or others caring for your newborn are  up-to-date with the Tdap or Td immunizations. The Tdap or Td immunization can help protect your newborn from getting ill.  Rubella immunization.  Varicella (chickenpox) immunization.  Influenza immunization. You should receive this annual immunization if you did not receive the immunization during your pregnancy. Document Released: 01/07/2007 Document Revised: 12/05/2011 Document Reviewed: 11/07/2011 [facility] Patient Information 2014 [place] [place].

## 2013-09-10 LAB — GC/CHLAMYDIA PROBE AMP
CT Probe RNA: NEGATIVE
GC PROBE AMP APTIMA: NEGATIVE

## 2013-10-20 ENCOUNTER — Ambulatory Visit
Admission: RE | Admit: 2013-10-20 | Discharge: 2013-10-20 | Disposition: A | Discharge status: Home / Self Care | Payer: No Typology Code available for payment source | Source: Ambulatory Visit | Attending: Infectious Disease | Admitting: Infectious Disease

## 2013-10-20 ENCOUNTER — Other Ambulatory Visit: Payer: Self-pay | Admitting: Infectious Disease

## 2013-10-20 DIAGNOSIS — IMO0001 Reserved for inherently not codable concepts without codable children: Secondary | ICD-10-CM

## 2014-01-25 ENCOUNTER — Encounter: Payer: Self-pay | Admitting: Family Medicine

## 2015-03-28 IMAGING — US US OB COMP +14 WK
1 series · 12 of 28 positions shown · non-contrast
Comparison: none

[Series 1: us ob detail +14 wk · 12 of 78 slices shown]
[im 3/78]
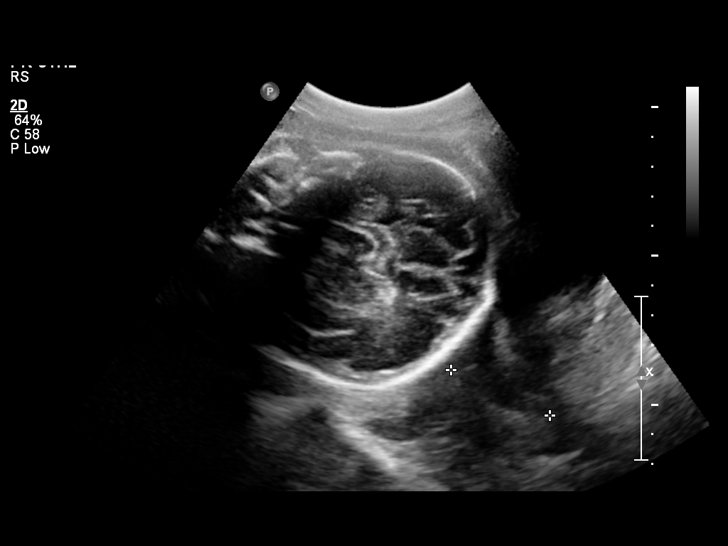
[im 9/78]
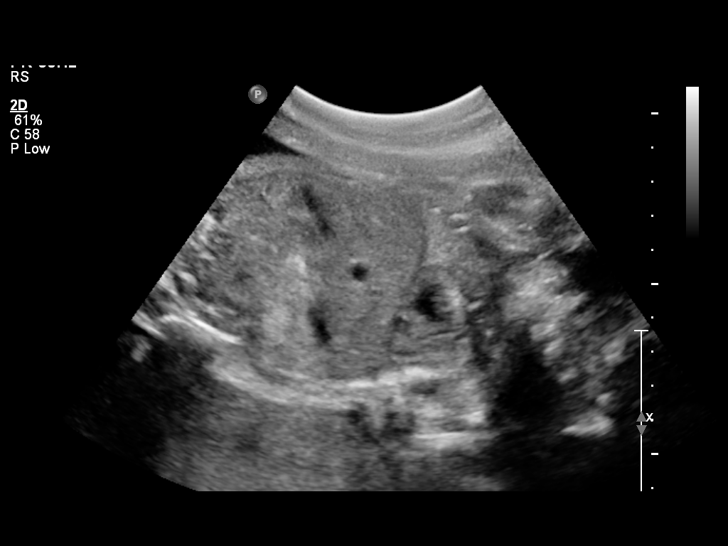
[im 15/78]
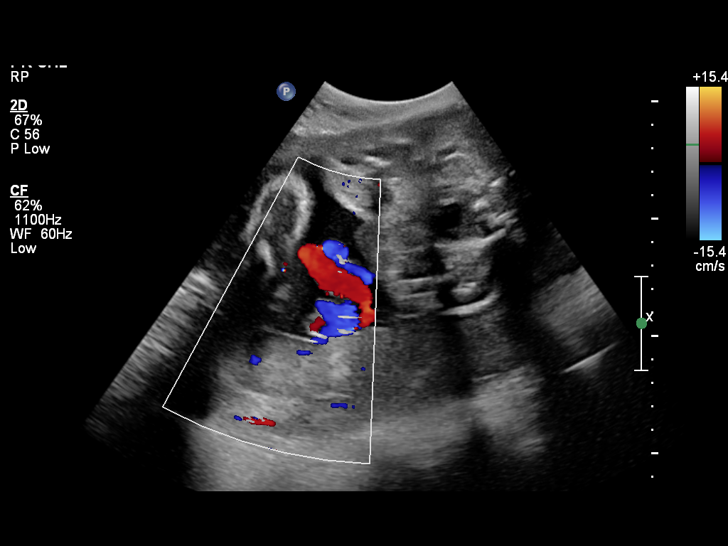
[im 23/78]
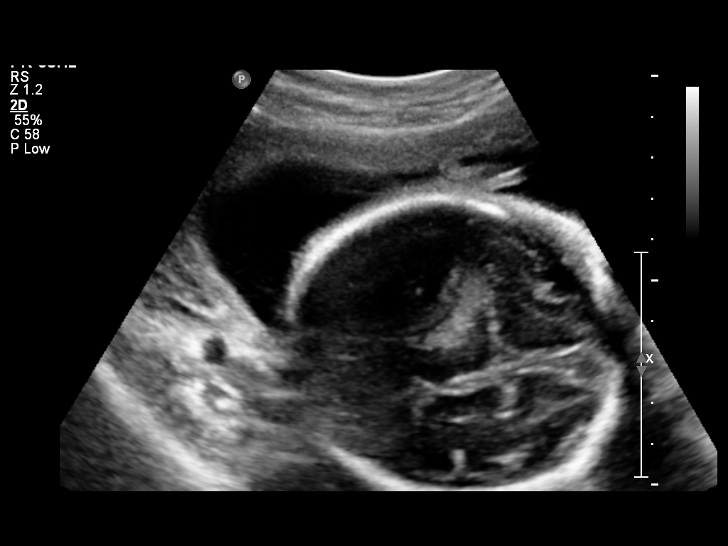
[im 29/78]
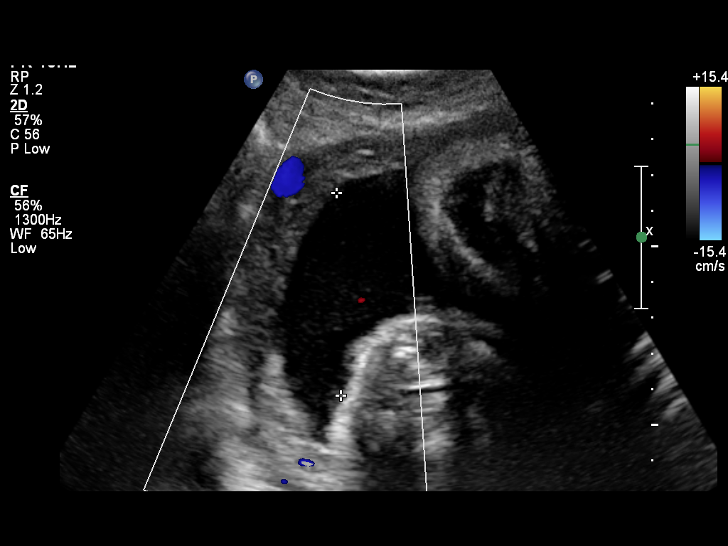
[im 35/78]
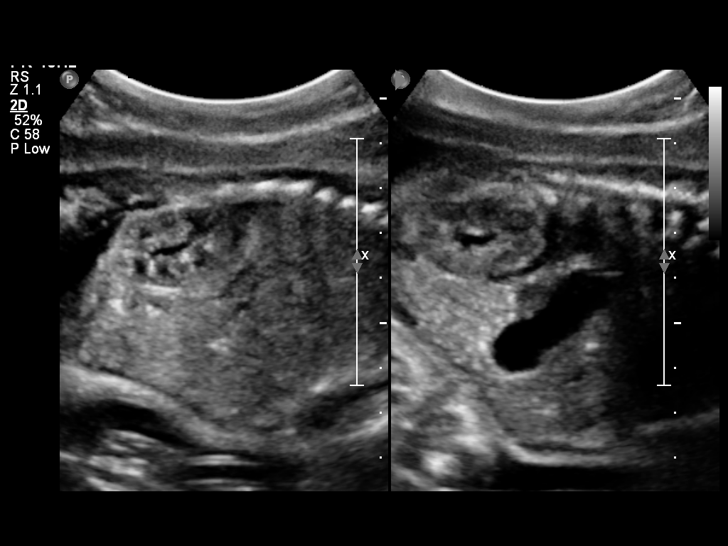
[im 43/78]
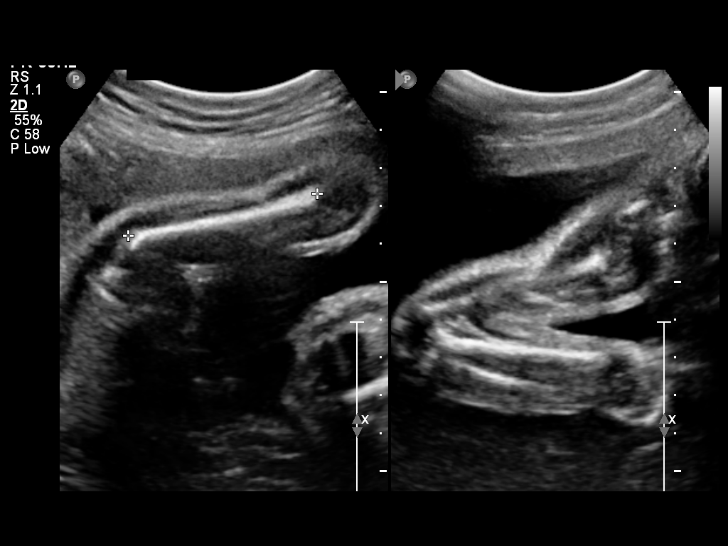
[im 49/78]
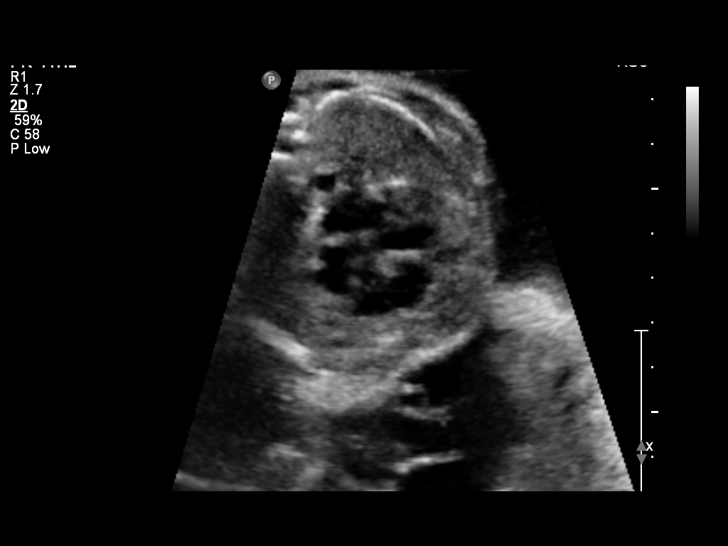
[im 55/78]
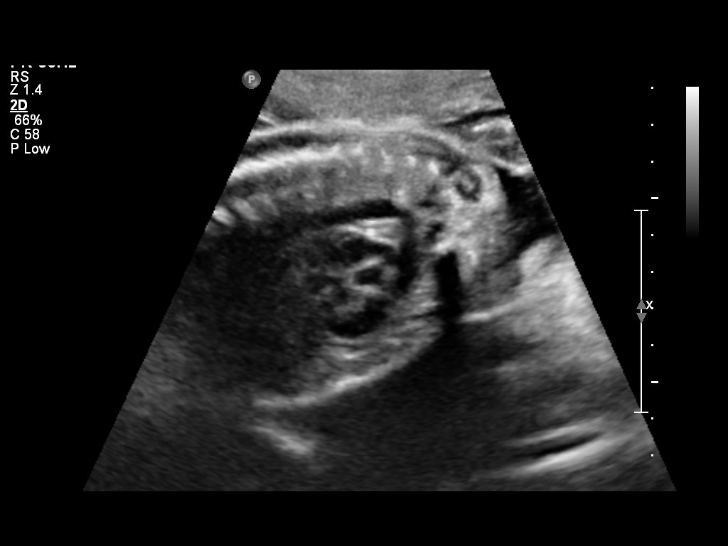
[im 63/78]
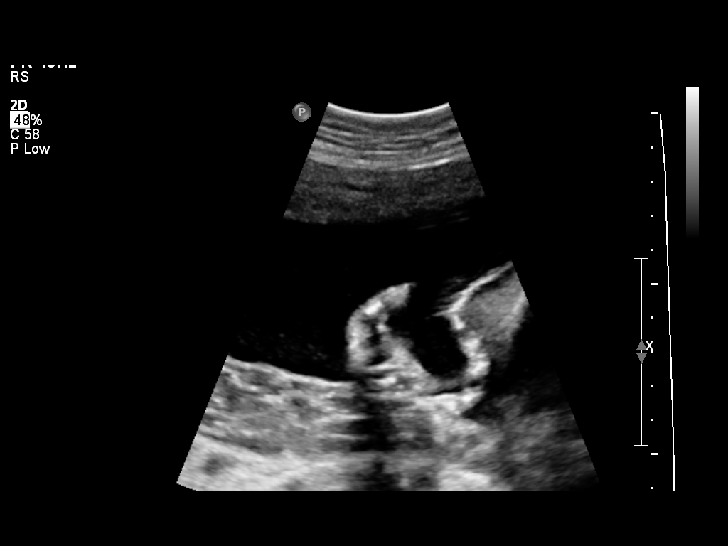
[im 69/78]
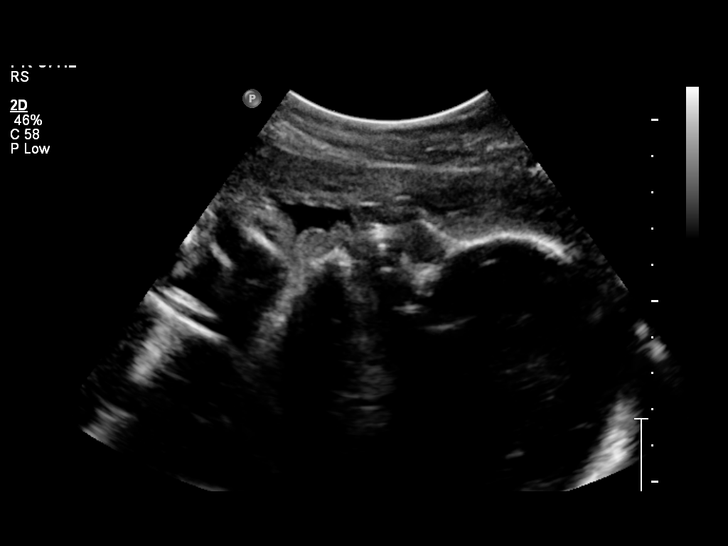
[im 75/78]
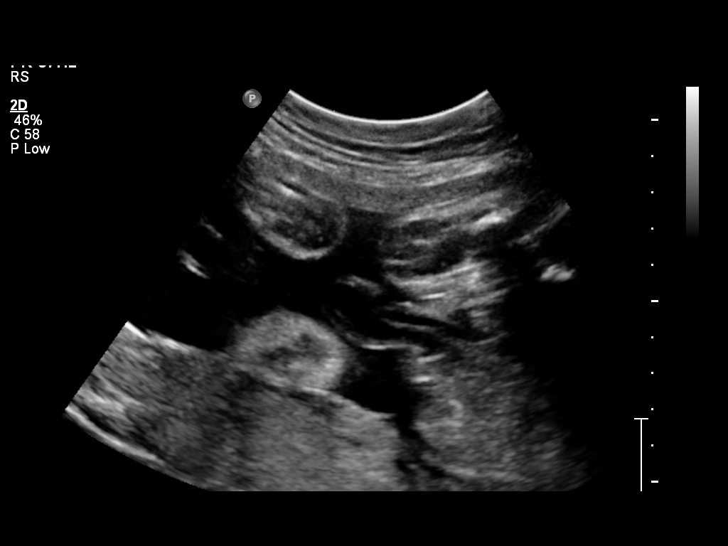

[12 of 28 positions shown; findings below may reference images not displayed]

OBSTETRICS REPORT
                      (Signed Final 05/14/2013 [DATE])

Service(s) Provided

 US OB COMP + 14 WK                                    76805.1
Indications

 Basic anatomic survey
Fetal Evaluation

 Num Of Fetuses:    1
 Fetal Heart Rate:  135                          bpm
 Cardiac Activity:  Observed
 Presentation:      Cephalic
 Placenta:          Posterior, above cervical
                    os
 P. Cord            Marginal insertion
 Insertion:

 Amniotic Fluid
 AFI FV:      Subjectively within normal limits
 AFI Sum:     18      cm       68  %Tile     Larg Pckt:    7.62  cm
 RUQ:   5.66    cm   RLQ:    7.62   cm    LUQ:   3.13    cm   LLQ:    1.59   cm
Biometry

 BPD:     77.5  mm     G. Age:  31w 1d                CI:        74.64   70 - 86
                                                      FL/HC:      19.9   19.2 -

 HC:     284.7  mm     G. Age:  31w 2d       56  %    HC/AC:      1.16   0.99 -

 AC:     245.9  mm     G. Age:  28w 6d       18  %    FL/BPD:     73.0   71 - 87
 FL:      56.6  mm     G. Age:  29w 5d       32  %    FL/AC:      23.0   20 - 24
 HUM:     50.6  mm     G. Age:  29w 5d       47  %

 Est. FW:    3505  gm      3 lb 2 oz     46  %
Gestational Age

 LMP:           27w 6d        Date:  10/31/12                 EDD:   08/07/13
 U/S Today:     30w 2d                                        EDD:   07/21/13
 Best:          29w 6d     Det. By:  Previous Ultrasound      EDD:   07/24/13
Anatomy

 Cranium:          Appears normal         Aortic Arch:      Appears normal
 Fetal Cavum:      Appears normal         Ductal Arch:      Appears normal
 Ventricles:       Appears normal         Diaphragm:        Appears normal
 Choroid Plexus:   Appears normal         Stomach:          Appears normal
 Cerebellum:       Appears normal         Abdomen:          Appears normal
 Posterior Fossa:  Appears normal         Abdominal Wall:   Appears nml (cord
                                                            insert, abd wall)
 Nuchal Fold:      Not applicable (>20    Cord Vessels:     Appears normal (3
                   wks GA)                                  vessel cord)
 Face:             Appears normal         Kidneys:          Appear normal
                   (orbits and profile)
 Lips:             Appears normal         Bladder:          Appears normal
 Heart:            Appears normal         Spine:            Appears normal
                   (4CH, axis, and
                   situs)
 RVOT:             Appears normal         Lower             Appears normal
                                          Extremities:
 LVOT:             Appears normal         Upper             Appears normal
                                          Extremities:

 Other:  Fetus appears to be a male. Heels and 5th digit visualized.
Cervix Uterus Adnexa

 Cervical Length:    3.6      cm

 Cervix:       Normal appearance by transabdominal scan.
 Uterus:       No abnormality visualized.
 Cul De Sac:   No free fluid seen.
 Left Ovary:    Not visualized.
 Right Ovary:   Not visualized.

 Adnexa:     No abnormality visualized.
Impression

 IUP at 29+6 weeks
 Normal detailed fetal anatomy
 Normal amniotic fluid volume
 Measurements consistent with early US; EFW at the 46th
 %tile
Recommendations

 Follow-up as clinically indicated

 questions or concerns.

## 2015-08-03 ENCOUNTER — Encounter (HOSPITAL_COMMUNITY)
Admission: AD | Disposition: A | Discharge status: Home / Self Care | Payer: Self-pay | Source: Ambulatory Visit | Attending: Obstetrics and Gynecology

## 2015-08-03 ENCOUNTER — Inpatient Hospital Stay (HOSPITAL_COMMUNITY): Payer: Medicaid Other | Admitting: Anesthesiology

## 2015-08-03 ENCOUNTER — Other Ambulatory Visit: Payer: Self-pay | Admitting: Obstetrics and Gynecology

## 2015-08-03 ENCOUNTER — Inpatient Hospital Stay (HOSPITAL_COMMUNITY): Payer: Medicaid Other

## 2015-08-03 ENCOUNTER — Encounter (HOSPITAL_COMMUNITY): Payer: Self-pay | Admitting: *Deleted

## 2015-08-03 ENCOUNTER — Observation Stay (HOSPITAL_COMMUNITY)
Admission: AD | Admit: 2015-08-03 | Discharge: 2015-08-03 | DRG: 770 | Disposition: A | Discharge status: Home / Self Care | Payer: Medicaid Other | Source: Ambulatory Visit | Attending: Obstetrics and Gynecology | Admitting: Obstetrics and Gynecology

## 2015-08-03 DIAGNOSIS — O034 Incomplete spontaneous abortion without complication: Secondary | ICD-10-CM | POA: Diagnosis not present

## 2015-08-03 DIAGNOSIS — O209 Hemorrhage in early pregnancy, unspecified: Secondary | ICD-10-CM

## 2015-08-03 HISTORY — PX: DILATION AND EVACUATION: SHX1459

## 2015-08-03 LAB — POCT PREGNANCY, URINE: PREG TEST UR: POSITIVE — AB

## 2015-08-03 LAB — CBC
HCT: 33.3 % — ABNORMAL LOW (ref 36.0–46.0)
Hemoglobin: 11.4 g/dL — ABNORMAL LOW (ref 12.0–15.0)
MCH: 29.6 pg (ref 26.0–34.0)
MCHC: 34.2 g/dL (ref 30.0–36.0)
MCV: 86.5 fL (ref 78.0–100.0)
Platelets: 224 10*3/uL (ref 150–400)
RBC: 3.85 MIL/uL — AB (ref 3.87–5.11)
RDW: 13.2 % (ref 11.5–15.5)
WBC: 9.5 10*3/uL (ref 4.0–10.5)

## 2015-08-03 LAB — URINALYSIS, ROUTINE W REFLEX MICROSCOPIC
BILIRUBIN URINE: NEGATIVE
Glucose, UA: NEGATIVE mg/dL
Ketones, ur: NEGATIVE mg/dL
Leukocytes, UA: NEGATIVE
Nitrite: NEGATIVE
PH: 5.5 (ref 5.0–8.0)
Protein, ur: NEGATIVE mg/dL

## 2015-08-03 LAB — URINE MICROSCOPIC-ADD ON

## 2015-08-03 LAB — PREPARE RBC (CROSSMATCH)

## 2015-08-03 LAB — HCG, QUANTITATIVE, PREGNANCY: HCG, BETA CHAIN, QUANT, S: 2781 m[IU]/mL — AB (ref <5)

## 2015-08-03 SURGERY — DILATION AND EVACUATION, UTERUS
Anesthesia: General | Site: Vagina

## 2015-08-03 MED ORDER — ONDANSETRON HCL 4 MG/2ML IJ SOLN
INTRAMUSCULAR | Status: AC
  Filled 2015-08-03: qty 2

## 2015-08-03 MED ORDER — OXYTOCIN 10 UNIT/ML IJ SOLN
40.0000 [IU]/h | Freq: Once | INTRAVENOUS | Status: AC
  Administered 2015-08-03: 40 [IU]/h via INTRAVENOUS
  Filled 2015-08-03: qty 4

## 2015-08-03 MED ORDER — DEXAMETHASONE SODIUM PHOSPHATE 4 MG/ML IJ SOLN
INTRAMUSCULAR | Status: DC | PRN
  Administered 2015-08-03: 10 mg via INTRAVENOUS

## 2015-08-03 MED ORDER — FAMOTIDINE IN NACL 20-0.9 MG/50ML-% IV SOLN
20.0000 mg | Freq: Once | INTRAVENOUS | Status: AC
  Administered 2015-08-03: 20 mg via INTRAVENOUS
  Filled 2015-08-03: qty 50

## 2015-08-03 MED ORDER — DEXAMETHASONE SODIUM PHOSPHATE 10 MG/ML IJ SOLN
INTRAMUSCULAR | Status: AC
  Filled 2015-08-03: qty 1

## 2015-08-03 MED ORDER — LACTATED RINGERS IV SOLN
INTRAVENOUS | Status: DC | PRN
  Administered 2015-08-03: 22:00:00 via INTRAVENOUS

## 2015-08-03 MED ORDER — CITRIC ACID-SODIUM CITRATE 334-500 MG/5ML PO SOLN
30.0000 mL | Freq: Once | ORAL | Status: AC
  Administered 2015-08-03: 30 mL via ORAL
  Filled 2015-08-03: qty 15

## 2015-08-03 MED ORDER — SCOPOLAMINE 1 MG/3DAYS TD PT72
MEDICATED_PATCH | TRANSDERMAL | Status: AC
  Filled 2015-08-03: qty 1

## 2015-08-03 MED ORDER — MIDAZOLAM HCL 2 MG/2ML IJ SOLN
INTRAMUSCULAR | Status: AC
  Filled 2015-08-03: qty 2

## 2015-08-03 MED ORDER — PROPOFOL 10 MG/ML IV BOLUS
INTRAVENOUS | Status: DC | PRN
  Administered 2015-08-03: 130 mg via INTRAVENOUS

## 2015-08-03 MED ORDER — LIDOCAINE HCL (CARDIAC) 20 MG/ML IV SOLN
INTRAVENOUS | Status: AC
  Filled 2015-08-03: qty 5

## 2015-08-03 MED ORDER — FENTANYL CITRATE (PF) 100 MCG/2ML IJ SOLN
INTRAMUSCULAR | Status: DC | PRN
  Administered 2015-08-03: 100 ug via INTRAVENOUS

## 2015-08-03 MED ORDER — FENTANYL CITRATE (PF) 100 MCG/2ML IJ SOLN
INTRAMUSCULAR | Status: AC
  Filled 2015-08-03: qty 2

## 2015-08-03 MED ORDER — FENTANYL CITRATE (PF) 100 MCG/2ML IJ SOLN
25.0000 ug | INTRAMUSCULAR | Status: DC | PRN
Start: 2015-08-03 — End: 2015-08-04

## 2015-08-03 MED ORDER — PROPOFOL 10 MG/ML IV BOLUS
INTRAVENOUS | Status: AC
  Filled 2015-08-03: qty 20

## 2015-08-03 MED ORDER — SUCCINYLCHOLINE CHLORIDE 20 MG/ML IJ SOLN
INTRAMUSCULAR | Status: AC
  Filled 2015-08-03: qty 1

## 2015-08-03 MED ORDER — LACTATED RINGERS IV BOLUS (SEPSIS)
1000.0000 mL | Freq: Once | INTRAVENOUS | Status: AC
  Administered 2015-08-03: 1000 mL via INTRAVENOUS

## 2015-08-03 MED ORDER — LIDOCAINE HCL (CARDIAC) 20 MG/ML IV SOLN
INTRAVENOUS | Status: DC | PRN
  Administered 2015-08-03: 60 mg via INTRAVENOUS

## 2015-08-03 MED ORDER — MIDAZOLAM HCL 5 MG/5ML IJ SOLN
INTRAMUSCULAR | Status: DC | PRN
  Administered 2015-08-03: 2 mg via INTRAVENOUS

## 2015-08-03 MED ORDER — SUCCINYLCHOLINE CHLORIDE 20 MG/ML IJ SOLN
INTRAMUSCULAR | Status: DC | PRN
  Administered 2015-08-03: 60 mg via INTRAVENOUS

## 2015-08-03 MED ORDER — LACTATED RINGERS IV BOLUS (SEPSIS)
1000.0000 mL | Freq: Once | INTRAVENOUS | Status: AC
  Administered 2015-08-03: 1000 mL via INTRAVENOUS
  Administered 2015-08-03: 600 mL via INTRAVENOUS

## 2015-08-03 MED ORDER — ONDANSETRON HCL 4 MG/2ML IJ SOLN
INTRAMUSCULAR | Status: DC | PRN
  Administered 2015-08-03: 4 mg via INTRAVENOUS

## 2015-08-03 SURGICAL SUPPLY — 20 items
CATH ROBINSON RED A/P 16FR (CATHETERS) ×3 IMPLANT
CLOTH BEACON ORANGE TIMEOUT ST (SAFETY) ×3 IMPLANT
DECANTER SPIKE VIAL GLASS SM (MISCELLANEOUS) ×3 IMPLANT
GLOVE BIOGEL PI IND STRL 7.0 (GLOVE) ×1 IMPLANT
GLOVE BIOGEL PI INDICATOR 7.0 (GLOVE) ×2
GLOVE ECLIPSE 9.0 STRL (GLOVE) ×6 IMPLANT
GOWN STRL REUS W/TWL 2XL LVL3 (GOWN DISPOSABLE) ×3 IMPLANT
GOWN STRL REUS W/TWL LRG LVL3 (GOWN DISPOSABLE) ×3 IMPLANT
KIT BERKELEY 1ST TRIMESTER 3/8 (MISCELLANEOUS) ×3 IMPLANT
NS IRRIG 1000ML POUR BTL (IV SOLUTION) ×3 IMPLANT
PACK VAGINAL MINOR WOMEN LF (CUSTOM PROCEDURE TRAY) ×3 IMPLANT
PAD OB MATERNITY 4.3X12.25 (PERSONAL CARE ITEMS) ×3 IMPLANT
PAD PREP 24X48 CUFFED NSTRL (MISCELLANEOUS) ×3 IMPLANT
SET BERKELEY SUCTION TUBING (SUCTIONS) ×3 IMPLANT
TOWEL OR 17X24 6PK STRL BLUE (TOWEL DISPOSABLE) ×6 IMPLANT
VACURETTE 10 RIGID CVD (CANNULA) IMPLANT
VACURETTE 12 RIGID CVD (CANNULA) IMPLANT
VACURETTE 7MM CVD STRL WRAP (CANNULA) IMPLANT
VACURETTE 8 RIGID CVD (CANNULA) IMPLANT
VACURETTE 9 RIGID CVD (CANNULA) IMPLANT

## 2015-08-03 NOTE — Transfer of Care (Signed)
Immediate Anesthesia Transfer of Care Note  Patient: Alicia Riley  Procedure(s) Performed: Procedure(s): DILATATION AND EVACUATION (N/A)  Patient Location: PACU  Anesthesia Type:General  Level of Consciousness: awake, alert  and oriented  Airway & Oxygen Therapy: Patient Spontanous Breathing and Patient connected to nasal cannula oxygen  Post-op Assessment: Report given to RN and Post -op Vital signs reviewed and stable  Post vital signs: Reviewed and stable  Last Vitals:  Filed Vitals:   08/03/15 2102 08/03/15 2131  BP: 105/67 104/58  Pulse: 112 99  Temp:    Resp:      Last Pain:  Filed Vitals:   08/03/15 2133  PainSc: 3          Complications: No apparent anesthesia complications

## 2015-08-03 NOTE — MAU Provider Note (Signed)
History     CSN: [id number]  Arrival date and time: 08/03/15 1709   First Provider Initiated Contact with Patient 08/03/15 1831      Chief Complaint  Patient presents with  . Vaginal Bleeding   HPI    Ms.Alicia Riley is a 35 y.o. female G3P2002 at unknown gestation presenting with abnormal vaginal bleeding. She started bleeding on 4/26 which was a normal time for her period and she thought her period was starting. The bleeding was very light, almost like spotting. Today she started having heavy vaginal bleeding that started at 2 PM.   She is not currently using anything for [facility], and does not think she is pregnant. Pregnancy test here is positive.   OB History    Gravida Para Term Preterm AB TAB SAB Ectopic Multiple Living   Past Medical History  Diagnosis Date  . Medical history non-contributory     Past Surgical History  Procedure Laterality Date  . No past surgeries      History reviewed. No pertinent family history.  Social History  Substance Use Topics  . Smoking status: Never Smoker   . Smokeless tobacco: Never Used  . Alcohol Use: No    Allergies: No Known Allergies  Prescriptions prior to admission  Medication Sig Dispense Refill Last Dose  . docusate sodium (COLACE) 100 MG capsule Take 1 capsule (100 mg total) by mouth 2 (two) times daily as needed. (Patient not taking: Reported on 08/03/2015) 30 capsule 2 Not Taking  . ibuprofen (ADVIL,MOTRIN) 600 MG tablet Take 1 tablet (600 mg total) by mouth every 6 (six) hours. (Patient not taking: Reported on 08/03/2015) 30 tablet 0 Not Taking  . norgestimate-ethinyl estradiol (MONONESSA) 0.25-35 MG-MCG tablet Take 1 tablet by mouth daily. (Patient not taking: Reported on 08/03/2015) 1 Package 11    Results for orders placed or performed during the hospital encounter of 08/03/15 (from the past 48 hour(s))  CBC     Status: Abnormal   Collection Time: 08/03/15  6:10 PM  Result Value Ref Range   WBC 9.5  4.0 - 10.5 K/uL   RBC 3.85 (L) 3.87 - 5.11 MIL/uL   Hemoglobin 11.4 (L) 12.0 - 15.0 g/dL   HCT 40.9 (L) 81.1 - 91.4 %   MCV 86.5 78.0 - 100.0 fL   MCH 29.6 26.0 - 34.0 pg   MCHC 34.2 30.0 - 36.0 g/dL   RDW 78.2 95.6 - 21.3 %   Platelets 224 150 - 400 K/uL  Pregnancy, urine POC     Status: Abnormal   Collection Time: 08/03/15  7:00 PM  Result Value Ref Range   Preg Test, Ur POSITIVE (A) NEGATIVE    Comment:        THE SENSITIVITY OF THIS METHODOLOGY IS >24 mIU/mL     Review of Systems  Constitutional: Negative for fever and chills.  Gastrointestinal: Negative for abdominal pain.  Neurological: Positive for dizziness.   Physical Exam   Blood pressure 119/66, pulse 125, temperature 98.1 F (36.7 C), temperature source Oral, resp. rate 18, height 5' 1.75" (1.568 m), weight 116 lb 12.8 oz (52.98 kg), last menstrual period 07/20/2015, SpO2 98 %, currently breastfeeding.  Physical Exam  Constitutional: She is oriented to person, place, and time. She appears well-developed and well-nourished. No distress.  HENT:  Head: Normocephalic.  Eyes: Pupils are equal, round, and reactive to light.  GI: Soft. She exhibits  no distension. There is no tenderness. There is no rebound.  Genitourinary:  Speculum exam: Vagina - Large amount of blood pooling in the vagina. Multiple, blot clots at the introitus and vaginal canal.   Cervix - visually open with large clot at cervix.  Bimanual exam: Cervix open, approximately 3 cm.  Uterus non tender, normal size Chaperone present for exam.  Musculoskeletal: Normal range of motion.  Neurological: She is alert and oriented to person, place, and time.  Skin: Skin is warm. She is not diaphoretic. There is pallor.  Psychiatric: Her behavior is normal.    ULTRASOUND REVEALS A LARGE HEMRORRHAGE AND TISSUE WITH GESTATIONAL SAC IN MID ENDOMETRIAL CAVITY, WITH SURROUNDING CLOTS AND FLUID. TISSUE SEVERAL CM'S ABOVE CERVIX. ADNEXA IS NEGATIVE FOR BLOOD. THERE  IS A SMALL OVARIAN SIMPLE CYST. Impression : Inevitable ab.  MAU Course  Procedures  None  MDM  HR 130's; LR bolus.  Urine cath collected UPT positive Dr. Emelda FearFerguson to the bedside US ordered Bedside: see note above. Pitocin 40 IV 2 IV sites established.   Crossmatch for 2 Units RBC   A positive blood type   Assessment and Plan   A:  Incomplete SAB  P:  Dr. Emelda FearFerguson to MAU to resume care of the patient 2010 Patient to the OR at 9:30 when NPO for solids x 8 hrs.   Duane LopeJennifer I Rasch, NP 08/03/2015 8:11 PM  I have counselled the patient via translator [facility]([employer]) in treatment options including inpatient observation with oxytocin or cytotec vs Dilation and curettage. Pt agrees to Dilation and Curettage as treatment of choice.  OR notified, will proceed at 21 :30/

## 2015-08-03 NOTE — Op Note (Signed)
Please see the brief operative note for surgical details 

## 2015-08-03 NOTE — Brief Op Note (Addendum)
08/03/2015  10:32 PM  PATIENT:  Alicia Riley  35 y.o. female  PRE-OPERATIVE DIAGNOSIS:  retained products of conception  POST-OPERATIVE DIAGNOSIS:  retained products of conception  PROCEDURE:  Procedure(s): DILATATION AND EVACUATION (N/A)  SURGEON:  Surgeon(s) and Role:    * Tilda BurrowJohn Alysabeth Scalia V, MD - Primary  PHYSICIAN ASSISTANT:   ASSISTANTS: none   ANESTHESIA:   general  EBL:  Total I/O In: 800 [I.V.:800] Out: 250 [Urine:200; Blood:50]  BLOOD ADMINISTERED:none  DRAINS: none   LOCAL MEDICATIONS USED:  NONE  SPECIMEN:  Source of Specimen:  Products of conception  DISPOSITION OF SPECIMEN:  PATHOLOGY  COUNTS:  YES  TOURNIQUET:  * No tourniquets in log *  DICTATION: .Dragon Dictation  PLAN OF CARE: Discharge to home after PACU  PATIENT DISPOSITION:  PACU - hemodynamically stable.   Delay start of Pharmacological VTE agent (>24hrs) due to surgical blood loss or risk of bleeding: not applicable Details of procedure patient was taken operating room prepped and draped for vaginal procedure with timeout conducted. Patient did not receive antibiotics. The bleeding had diminished significantly. An running Pitocin for approximately 3 hours at the time of transfer to the OR. Speculum exam showed polyps conception were visible at the external os can be grasped with ring forceps and taken out en bloc with the vast majority POC's removed promptly sounded to 9 cm dilated was not necessary and suction curettage removed a few tiny additional fragments and completed the procedure. Blood type is confirmed as Rh+ by surgical team. The recovery in stable condition and will be discharged the same evening

## 2015-08-03 NOTE — Discharge Instructions (Signed)
DISCHARGE INSTRUCTIONS: D&C / D&E The following instructions have been prepared to help you care for yourself upon your return home.  May remove Scop patch behind ear on or before 08/06/15   Personal hygiene:  Use sanitary pads for vaginal drainage, not tampons.  Shower the day after your procedure.  NO tub baths, pools or Jacuzzis for 2-3 weeks.  Wipe front to back after using the bathroom.  Activity and limitations:  Do NOT drive or operate any equipment for 24 hours. The effects of anesthesia are still present and drowsiness may result.  Do NOT rest in bed all day.  Walking is encouraged.  Walk up and down stairs slowly.  You may resume your normal activity in one to two days or as indicated by your physician.  Sexual activity: NO intercourse for at least 2 weeks after the procedure, or as indicated by your physician.  Diet: Eat a light meal as desired this evening. You may resume your usual diet tomorrow.  Return to work: You may resume your work activities in one to two days or as indicated by your doctor.  What to expect after your surgery: Expect to have vaginal bleeding/discharge for 2-3 days and spotting for up to 10 days. It is not unusual to have soreness for up to 1-2 weeks. You may have a slight burning sensation when you urinate for the first day. Mild cramps may continue for a couple of days. You may have a regular period in 2-6 weeks.  Call your doctor for any of the following:  Excessive vaginal bleeding, saturating and changing one pad every hour.  Inability to urinate 6 hours after discharge from hospital.  Pain not relieved by pain medication.  Fever of 100.4 F or greater.  Unusual vaginal discharge or odor.

## 2015-08-03 NOTE — MAU Note (Signed)
Pt started LMP x 2 weeks ago.  Was normal and started to stop.  Today started back up really heavy about 1400.  Pt has same sanitary pad on since bleeding got heavy.

## 2015-08-03 NOTE — H&P (Signed)
[name], MD Physician Signed Obstetrics/Gynecology MAU Provider Note 08/03/2015 6:32 PM    Expand All Collapse All    History     CSN: [id number]  Arrival date and time: 08/03/15 1709  First Provider Initiated Contact with Patient 08/03/15 1831    Chief Complaint  Patient presents with  . Vaginal Bleeding   HPI    Alicia Riley is a 35 y.o. female G3P2002 at unknown gestation presenting with abnormal vaginal bleeding. She started bleeding on 4/26 which was a normal time for her period and she thought her period was starting. The bleeding was very light, almost like spotting. Today she started having heavy vaginal bleeding that started at 2 PM.   She is not currently using anything for [facility], and does not think she is pregnant. Pregnancy test here is positive.   OB History    Gravida Para Term Preterm AB TAB SAB Ectopic Multiple Living   Past Medical History  Diagnosis Date  . Medical history non-contributory     Past Surgical History  Procedure Laterality Date  . No past surgeries      History reviewed. No pertinent family history.  Social History  Substance Use Topics  . Smoking status: Never Smoker   . Smokeless tobacco: Never Used  . Alcohol Use: No    Allergies: No Known Allergies  Prescriptions prior to admission  Medication Sig Dispense Refill Last Dose  . docusate sodium (COLACE) 100 MG capsule Take 1 capsule (100 mg total) by mouth 2 (two) times daily as needed. (Patient not taking: Reported on 08/03/2015) 30 capsule 2 Not Taking  . ibuprofen (ADVIL,MOTRIN) 600 MG tablet Take 1 tablet (600 mg total) by mouth every 6 (six) hours. (Patient not taking: Reported on 08/03/2015) 30 tablet 0 Not Taking  . norgestimate-ethinyl estradiol (MONONESSA) 0.25-35 MG-MCG tablet Take 1 tablet by mouth daily. (Patient not taking: Reported on 08/03/2015) 1  Package 11     Lab Results Last 48 Hours    Results for orders placed or performed during the hospital encounter of 08/03/15 (from the past 48 hour(s))  CBC Status: Abnormal   Collection Time: 08/03/15 6:10 PM  Result Value Ref Range   WBC 9.5 4.0 - 10.5 K/uL   RBC 3.85 (L) 3.87 - 5.11 MIL/uL   Hemoglobin 11.4 (L) 12.0 - 15.0 g/dL   HCT 78.2 (L) 95.6 - 21.3 %   MCV 86.5 78.0 - 100.0 fL   MCH 29.6 26.0 - 34.0 pg   MCHC 34.2 30.0 - 36.0 g/dL   RDW 08.6 57.8 - 46.9 %   Platelets 224 150 - 400 K/uL  Pregnancy, urine POC Status: Abnormal   Collection Time: 08/03/15 7:00 PM  Result Value Ref Range   Preg Test, Ur POSITIVE (A) NEGATIVE    Comment:   THE SENSITIVITY OF THIS METHODOLOGY IS >24 mIU/mL       Review of Systems  Constitutional: Negative for fever and chills.  Gastrointestinal: Negative for abdominal pain.  Neurological: Positive for dizziness.   Physical Exam   Blood pressure 119/66, pulse 125, temperature 98.1 F (36.7 C), temperature source Oral, resp. rate 18, height 5' 1.75" (1.568 m), weight 116 lb 12.8 oz (52.98 kg), last menstrual period 07/20/2015, SpO2 98 %, currently breastfeeding.  Physical Exam  Constitutional: She is oriented to person, place, and time. She appears well-developed and well-nourished. No distress.  HENT:  Head:  Normocephalic.  Eyes: Pupils are equal, round, and reactive to light.  GI: Soft. She exhibits no distension. There is no tenderness. There is no rebound.  Genitourinary:  Speculum exam: Vagina - Large amount of blood pooling in the vagina. Multiple, blot clots at the introitus and vaginal canal.  Cervix - visually open with large clot at cervix.  Bimanual exam: Cervix open, approximately 3 cm.  Uterus non tender, normal size Chaperone present for exam.  Musculoskeletal: Normal range of motion.  Neurological: She is alert and oriented to person,  place, and time.  Skin: Skin is warm. She is not diaphoretic. There is pallor.  Psychiatric: Her behavior is normal.   ULTRASOUND REVEALS A LARGE HEMRORRHAGE AND TISSUE WITH GESTATIONAL SAC IN MID ENDOMETRIAL CAVITY, WITH SURROUNDING CLOTS AND FLUID. TISSUE SEVERAL CM'S ABOVE CERVIX. ADNEXA IS NEGATIVE FOR BLOOD. THERE IS A SMALL OVARIAN SIMPLE CYST. Impression : Inevitable ab.  MAU Course  Procedures  None  MDM  HR 130's; LR bolus.  Urine cath collected UPT positive Dr. Emelda FearFerguson to the bedside US ordered Bedside: see note above. Pitocin 40 IV 2 IV sites established.   Crossmatch for 2 Units RBC   A positive blood type   Assessment and Plan   A:  Incomplete SAB  P:  Dr. Emelda FearFerguson to MAU to resume care of the patient 2010 Patient to the OR at 9:30 when NPO for solids x 8 hrs.   Alicia LopeJennifer I Rasch, NP 08/03/2015 8:11 PM  I have counselled the patient via translator [facility]([employer]) in treatment options including inpatient observation with oxytocin or cytotec vs Dilation and curettage. Pt agrees to Dilation and Curettage as treatment of choice.  OR notified, will proceed at 21 :30/

## 2015-08-03 NOTE — Anesthesia Procedure Notes (Signed)
Procedure Name: Intubation Date/Time: 08/03/2015 9:55 PM Performed by: Janeece AgeeWRAPE, Kharizma Lesnick W Pre-anesthesia Checklist: Patient identified, Emergency Drugs available, Suction available, Patient being monitored and Timeout performed Patient Re-evaluated:Patient Re-evaluated prior to inductionOxygen Delivery Method: Circle system utilized Preoxygenation: Pre-oxygenation with 100% oxygen Intubation Type: IV induction, Rapid sequence and Cricoid Pressure applied Laryngoscope Size: Mac and 3 Grade View: Grade II Tube type: Oral Tube size: 7.0 mm Number of attempts: 1 Airway Equipment and Method: Stylet Placement Confirmation: ETT inserted through vocal cords under direct vision,  positive ETCO2 and breath sounds checked- equal and bilateral Secured at: 22 cm Tube secured with: Tape Dental Injury: Teeth and Oropharynx as per pre-operative assessment

## 2015-08-03 NOTE — Anesthesia Preprocedure Evaluation (Addendum)
Anesthesia Evaluation  Patient identified by MRN, date of birth, ID band Patient awake    Reviewed: Allergy & Precautions, NPO status , Patient's Chart, lab work & pertinent test results  Airway Mallampati: II  TM Distance: >3 FB Neck ROM: Full    Dental  (+) Teeth Intact, Dental Advisory Given, Missing   Pulmonary neg pulmonary ROS,    Pulmonary exam normal breath sounds clear to auscultation       Cardiovascular negative cardio ROS   Rhythm:Regular Rate:Tachycardia     Neuro/Psych negative neurological ROS     GI/Hepatic negative GI ROS, Neg liver ROS,   Endo/Other  negative endocrine ROS  Renal/GU negative Renal ROS     Musculoskeletal negative musculoskeletal ROS (+)   Abdominal   Peds  Hematology negative hematology ROS (+)   Anesthesia Other Findings Day of surgery medications reviewed with the patient.  Reproductive/Obstetrics (+) Pregnancy (Incomplete SAB) 35 y.o. female G3P2002 at unknown gestation presenting with abnormal vaginal bleeding                          Anesthesia Physical Anesthesia Plan  ASA: II and emergent  Anesthesia Plan: General   Post-op Pain Management:    Induction: Intravenous and Rapid sequence  Airway Management Planned: Oral ETT  Additional Equipment:   Intra-op Plan:   Post-operative Plan: Extubation in OR  Informed Consent: I have reviewed the patients History and Physical, chart, labs and discussed the procedure including the risks, benefits and alternatives for the proposed anesthesia with the patient or authorized representative who has indicated his/her understanding and acceptance.   Dental advisory given  Plan Discussed with: CRNA  Anesthesia Plan Comments: (Risks/benefits of general anesthesia discussed with patient including risk of damage to teeth, lips, gum, and tongue, nausea/vomiting, allergic reactions to medications, and the  possibility of heart attack, stroke and death.  All patient questions answered.  Patient wishes to proceed.  Last ate 1300. Drank water since arrival to MAU.  [employer] tradernterpreter used for entire patient interaction.)       Anesthesia Quick Evaluation

## 2015-08-04 ENCOUNTER — Encounter (HOSPITAL_COMMUNITY): Payer: Self-pay | Admitting: Obstetrics and Gynecology

## 2015-08-04 LAB — HIV ANTIBODY (ROUTINE TESTING W REFLEX): HIV SCREEN 4TH GENERATION: NONREACTIVE

## 2015-08-04 NOTE — Progress Notes (Addendum)
Used Lawyerlanguage line interpreter ID# [phone]

## 2015-08-05 LAB — TYPE AND SCREEN
ABO/RH(D): A POS
Antibody Screen: NEGATIVE
UNIT DIVISION: 0
Unit division: 0

## 2015-08-08 NOTE — Brief Op Note (Signed)
08/03/2015  12:17 PM  PATIENT:  Tamela OddiMai Shaneika  35 y.o. female  PRE-OPERATIVE DIAGNOSIS:  retained products of conception  POST-OPERATIVE DIAGNOSIS:  retained products of conception  PROCEDURE:  Procedure(s): DILATATION AND EVACUATION (N/A)  SURGEON:  Surgeon(s) and Role:    * Tilda BurrowJohn Malaiya Paczkowski V, MD - Primary  PHYSICIAN ASSISTANT:   ASSISTANTS: none   ANESTHESIA:   general  EBL:     BLOOD ADMINISTERED:none  DRAINS: none   LOCAL MEDICATIONS USED:  NONE  SPECIMEN:  Source of Specimen:  Products of conception  DISPOSITION OF SPECIMEN:  PATHOLOGY  COUNTS:  YES  TOURNIQUET:  * No tourniquets in log *  DICTATION: .Dragon Dictation  PLAN OF CARE: Discharge to home after PACU  PATIENT DISPOSITION:  PACU - hemodynamically stable.   Delay start of Pharmacological VTE agent (>24hrs) due to surgical blood loss or risk of bleeding: not applicable Details of procedure: This 35 year old G3P2002 was admitted via the MAU with heavy vaginal bleeding with ultrasound identifying clots and tissue inside the uterine cavity she was given IV oxytocin infusion, scheduled for D&C after 8 hour nothing by mouth status. Patient was taken operating room prepped and draped for vaginal procedure with timeout conducted and confirmed by surgical team. The bleeding and been minimal over the last couple of hours bleeding and the procedure after arrival to the MAU and initiation of Pitocin. The speculum was inserted and the cervix seen to be open sufficiently that ring forceps could be used to grasp the large amount of tissue in the lower uterine segment which could be extracted essentially intact with adjacent clots. Suction curettage then followed obtaining scanty amount of additional tissue and clots. And only sounded to 8 cm sponge and needle counts were correct patient tolerated procedure well with minimal additional bleeding and was allowed to go to the recovery room in stable condition blood type had been  confirmed as A positive .

## 2015-08-08 NOTE — Anesthesia Postprocedure Evaluation (Signed)
Anesthesia Post Note  Patient: Alicia Riley  Procedure(s) Performed: Procedure(s) (LRB): DILATATION AND EVACUATION (N/A)  Patient location during evaluation: PACU Anesthesia Type: General Level of consciousness: awake and alert Pain management: pain level controlled Vital Signs Assessment: post-procedure vital signs reviewed and stable Respiratory status: spontaneous breathing, nonlabored ventilation, respiratory function stable and patient connected to nasal cannula oxygen Cardiovascular status: blood pressure returned to baseline and stable Postop Assessment: no signs of nausea or vomiting Anesthetic complications: no    Last Vitals:  Filed Vitals:   08/03/15 2315 08/03/15 2330  BP: 108/70 106/69  Pulse: 108 97  Temp:  37.2 C  Resp:  22    Last Pain:  Filed Vitals:   08/04/15 1627  PainSc: 2                  Cecile HearingStephen Edward Turk

## 2015-09-15 ENCOUNTER — Ambulatory Visit: Payer: Medicaid Other | Admitting: Obstetrics and Gynecology

## 2016-06-07 ENCOUNTER — Encounter (HOSPITAL_COMMUNITY): Payer: Self-pay

## 2018-03-26 NOTE — L&D Delivery Note (Addendum)
Delivery Note At 10:21 AM a viable child was delivered via Vaginal, Spontaneous (Presentation: Vertex; LOA).  APGAR: 8, 9; weight 7lbs 6.9oz (3370g). Placenta status: Spontaneous, Intact.  Cord: 3 vessel with the following complications: none.   Anesthesia: None Episiotomy: None Lacerations: None Suture Repair: None Est. Blood Loss (mL):  50  Mom to postpartum.  Baby to Couplet care / Skin to Skin.  This was a precipitous delivery in the MAU. We were called approximately 10 minutes before the birth.  Gerlene Fee, DO Family Medicine Resident, PGY-1 10/05/2018, 10:40 AM

## 2018-05-14 ENCOUNTER — Ambulatory Visit: Payer: Self-pay

## 2018-05-15 ENCOUNTER — Ambulatory Visit (INDEPENDENT_AMBULATORY_CARE_PROVIDER_SITE_OTHER): Payer: Medicaid Other | Admitting: *Deleted

## 2018-05-15 ENCOUNTER — Other Ambulatory Visit: Payer: Self-pay

## 2018-05-15 DIAGNOSIS — Z3201 Encounter for pregnancy test, result positive: Secondary | ICD-10-CM

## 2018-05-15 DIAGNOSIS — Z3200 Encounter for pregnancy test, result unknown: Secondary | ICD-10-CM

## 2018-05-15 LAB — POCT PREGNANCY, URINE: PREG TEST UR: POSITIVE — AB

## 2018-05-15 NOTE — Progress Notes (Signed)
Here for pregnancy test which was positive. Used [employer] Interpreter Pyi . [id number]. She states   LMP was 01/02/18 or a day or two around that. This makes her about 19 weeks with EDD 10/09/18. Instructed to start prenatal care and is taking prenatal vitamins. Would like to come here. Sent to registrar to schedule.  Legrand Como

## 2018-05-17 NOTE — Progress Notes (Signed)
Patient ID: [name], female   DOB: 1980-09-30, 38 y.o.   MRN: [id number] I have reviewed the chart and agree with nursing staff's documentation of this patient's encounter.  Scheryl Darter, MD 05/17/2018 10:02 AM

## 2018-05-29 ENCOUNTER — Encounter: Payer: Self-pay | Admitting: Obstetrics and Gynecology

## 2018-05-29 ENCOUNTER — Ambulatory Visit (INDEPENDENT_AMBULATORY_CARE_PROVIDER_SITE_OTHER): Payer: Medicaid Other | Admitting: Obstetrics and Gynecology

## 2018-05-29 ENCOUNTER — Other Ambulatory Visit (HOSPITAL_COMMUNITY)
Admission: RE | Admit: 2018-05-29 | Discharge: 2018-05-29 | Disposition: A | Discharge status: Home / Self Care | Payer: Medicaid Other | Source: Ambulatory Visit | Attending: Obstetrics and Gynecology | Admitting: Obstetrics and Gynecology

## 2018-05-29 DIAGNOSIS — Z3480 Encounter for supervision of other normal pregnancy, unspecified trimester: Secondary | ICD-10-CM | POA: Insufficient documentation

## 2018-05-29 DIAGNOSIS — Z3482 Encounter for supervision of other normal pregnancy, second trimester: Secondary | ICD-10-CM

## 2018-05-29 DIAGNOSIS — Z3A21 21 weeks gestation of pregnancy: Secondary | ICD-10-CM

## 2018-05-29 DIAGNOSIS — O0930 Supervision of pregnancy with insufficient antenatal care, unspecified trimester: Secondary | ICD-10-CM | POA: Insufficient documentation

## 2018-05-29 NOTE — Progress Notes (Signed)
History:   Anuva Andazola is a 38 y.o. [id number] at [redacted]w[redacted]d by LMP being seen today for her first obstetrical visit.  Her obstetrical history is significant for advanced maternal age, late to care.  Patient does intend to breast feed. Pregnancy history fully reviewed. Unplanned pregnancy, however ok with pregnancy now.   Patient reports no complaints.   HISTORY: OB History  Gravida Para Term Preterm AB Living  4 2 2  0 1 2  SAB TAB Ectopic Multiple Live Births  1 0 0 0 2    # Outcome Date GA Lbr Len/2nd Weight Sex Delivery Anes PTL Lv  4 Current           3 SAB 2017          2 Term 07/27/13 [redacted]w[redacted]d 09:22 / 04:25 7 lb 12.2 oz (3.52 kg) M Vag-Spont EPI  LIV     Name: Blu,BOY Niaya     Apgar1: 9  Apgar5: 9  1 Term 04/12/07 [redacted]w[redacted]d  7 lb (3.175 kg) M Vag-Spont None  LIV    Last pap smear was done 2015 and was normal  Past Medical History:  Diagnosis Date  . Medical history non-contributory    Past Surgical History:  Procedure Laterality Date  . DILATION AND EVACUATION N/A 08/03/2015   Procedure: DILATATION AND EVACUATION;  Surgeon: [name], MD;  Location: [facility];  Service: Gynecology;  Laterality: N/A;  . NO PAST SURGERIES     No family history on file. Social History   Tobacco Use  . Smoking status: Never Smoker  . Smokeless tobacco: Never Used  Substance Use Topics  . Alcohol use: No  . Drug use: No   No Known Allergies Current Outpatient Medications on File Prior to Visit  Medication Sig Dispense Refill  . Prenatal Vit-Fe Fumarate-FA (PRENATAL MULTIVITAMIN) TABS tablet Take 1 tablet by mouth daily at 12 noon.    . docusate sodium (COLACE) 100 MG capsule Take 1 capsule (100 mg total) by mouth 2 (two) times daily as needed. (Patient not taking: Reported on 08/03/2015) 30 capsule 2  . ibuprofen (ADVIL,MOTRIN) 600 MG tablet Take 1 tablet (600 mg total) by mouth every 6 (six) hours. (Patient not taking: Reported on 08/03/2015) 30 tablet 0  . norgestimate-ethinyl estradiol (MONONESSA)  0.25-35 MG-MCG tablet Take 1 tablet by mouth daily. (Patient not taking: Reported on 08/03/2015) 1 Package 11   No current facility-administered medications on file prior to visit.     Review of Systems Pertinent items noted in HPI and remainder of comprehensive ROS otherwise negative. Physical Exam:   Vitals:   05/29/18 1457  BP: 118/71  Pulse: 91  Weight: 133 lb 12.8 oz (60.7 kg)   Fetal Heart Rate (bpm): 154 BP 118/71   Pulse 91   Wt 133 lb 12.8 oz (60.7 kg)   LMP 01/02/2018 (Exact Date)   BMI 24.67 kg/m  Uterine Size: size equals dates  Pelvic Exam:    Perineum: + Hemorrhoids, Normal Perineum   Vulva: normal   Vagina:  normal mucosa, normal discharge, no palpable nodules   pH: Not done   Cervix: + bleeding following Pap, no cervical motion tenderness and no lesions   Adnexa: normal adnexa and no mass, fullness, tenderness   Bony Pelvis: Adequate   Skin: normal coloration and turgor, no rashes    Neurologic: negative   Extremities: normal strength, tone, and muscle mass   HEENT neck supple with midline trachea and thyroid without masses  Mouth/Teeth mucous membranes moist, pharynx normal without lesions   Neck supple and no masses   Cardiovascular: regular rate and rhythm, no murmurs or gallops   Respiratory:  appears well, vitals normal, no respiratory distress, acyanotic, normal RR, neck free of mass or lymphadenopathy, chest clear, no wheezing, crepitations, rhonchi, normal symmetric air entry   Abdomen: soft, non-tender; bowel sounds normal; no masses,  no organomegaly   Urinary: urethral meatus normal   Assessment:    Pregnancy: [id number] Patient Active Problem List   Diagnosis Date Noted  . Supervision of other normal pregnancy, antepartum 05/29/2018  . Late prenatal care affecting pregnancy 05/29/2018  . Language barrier affecting health care 05/11/2013  . Cervical polyp 05/11/2013     Plan:   1. Supervision of other normal pregnancy, antepartum  -  Culture, OB Urine - Obstetric Panel, Including HIV - [place] MFM OB DETAIL +14 WK; Future - Inheritest(R) CF/SMA Panel - AFP, Serum, Open Spina Bifida - Cytology - PAP( [place]) - Genetic Screening  Live interpretor present   Initial labs drawn. Continue prenatal vitamins. Genetic Screening discussed, NIPS: requested. Ultrasound discussed; fetal anatomic survey: ordered. Problem list reviewed and updated. The nature of [facility] with multiple MDs and other Advanced Practice Providers was explained to patient; also emphasized that residents, students are part of our team. Routine obstetric precautions reviewed. No follow-ups on file.       Rasch, Harolyn Rutherford, NP [facility] Center for [employer], [facility]

## 2018-05-31 LAB — URINE CULTURE, OB REFLEX

## 2018-05-31 LAB — CULTURE, OB URINE

## 2018-06-04 ENCOUNTER — Ambulatory Visit (HOSPITAL_COMMUNITY): Payer: Medicaid Other | Admitting: *Deleted

## 2018-06-04 ENCOUNTER — Ambulatory Visit (HOSPITAL_COMMUNITY)
Admission: RE | Admit: 2018-06-04 | Discharge: 2018-06-04 | Disposition: A | Discharge status: Home / Self Care | Payer: Medicaid Other | Source: Ambulatory Visit | Attending: Obstetrics and Gynecology | Admitting: Obstetrics and Gynecology

## 2018-06-04 ENCOUNTER — Other Ambulatory Visit: Payer: Self-pay

## 2018-06-04 ENCOUNTER — Encounter (HOSPITAL_COMMUNITY): Payer: Self-pay

## 2018-06-04 DIAGNOSIS — O0930 Supervision of pregnancy with insufficient antenatal care, unspecified trimester: Secondary | ICD-10-CM | POA: Insufficient documentation

## 2018-06-04 DIAGNOSIS — Z3A22 22 weeks gestation of pregnancy: Secondary | ICD-10-CM

## 2018-06-04 DIAGNOSIS — O0932 Supervision of pregnancy with insufficient antenatal care, second trimester: Secondary | ICD-10-CM | POA: Diagnosis not present

## 2018-06-04 DIAGNOSIS — Z3480 Encounter for supervision of other normal pregnancy, unspecified trimester: Secondary | ICD-10-CM

## 2018-06-04 DIAGNOSIS — O09512 Supervision of elderly primigravida, second trimester: Secondary | ICD-10-CM | POA: Diagnosis not present

## 2018-06-04 DIAGNOSIS — Z363 Encounter for antenatal screening for malformations: Secondary | ICD-10-CM

## 2018-06-04 DIAGNOSIS — Z3687 Encounter for antenatal screening for uncertain dates: Secondary | ICD-10-CM

## 2018-06-05 ENCOUNTER — Other Ambulatory Visit (HOSPITAL_COMMUNITY): Payer: Self-pay | Admitting: *Deleted

## 2018-06-05 DIAGNOSIS — O09522 Supervision of elderly multigravida, second trimester: Secondary | ICD-10-CM

## 2018-06-06 LAB — CYTOLOGY - PAP
Chlamydia: NEGATIVE
DIAGNOSIS: NEGATIVE
HPV (WINDOPATH): NOT DETECTED
Neisseria Gonorrhea: NEGATIVE

## 2018-06-10 ENCOUNTER — Encounter: Payer: Self-pay | Admitting: *Deleted

## 2018-06-10 LAB — AFP, SERUM, OPEN SPINA BIFIDA
AFP MOM: 1.08
AFP VALUE AFPOSL: 75.7 ng/mL
Gest. Age on Collection Date: 21 weeks
Maternal Age At EDD: 37.5 yr
OSBR RISK 1 IN: 9540
Test Results:: NEGATIVE
Weight: 133 [lb_av]

## 2018-06-10 LAB — OBSTETRIC PANEL, INCLUDING HIV
Antibody Screen: NEGATIVE
BASOS ABS: 0 10*3/uL (ref 0.0–0.2)
BASOS: 0 %
EOS (ABSOLUTE): 0.2 10*3/uL (ref 0.0–0.4)
Eos: 4 %
HEMATOCRIT: 32.3 % — AB (ref 34.0–46.6)
HEMOGLOBIN: 10.8 g/dL — AB (ref 11.1–15.9)
HEP B S AG: NEGATIVE
HIV SCREEN 4TH GENERATION: NONREACTIVE
Immature Grans (Abs): 0 10*3/uL (ref 0.0–0.1)
Immature Granulocytes: 1 %
Lymphocytes Absolute: 1.1 10*3/uL (ref 0.7–3.1)
Lymphs: 16 %
MCH: 29.8 pg (ref 26.6–33.0)
MCHC: 33.4 g/dL (ref 31.5–35.7)
MCV: 89 fL (ref 79–97)
MONOCYTES: 8 %
Monocytes Absolute: 0.5 10*3/uL (ref 0.1–0.9)
NEUTROS ABS: 4.8 10*3/uL (ref 1.4–7.0)
Neutrophils: 71 %
PLATELETS: 194 10*3/uL (ref 150–450)
RBC: 3.63 x10E6/uL — ABNORMAL LOW (ref 3.77–5.28)
RDW: 13.1 % (ref 11.7–15.4)
RPR: NONREACTIVE
RUBELLA: 5.84 {index} (ref >0.99)
Rh Factor: POSITIVE
WBC: 6.6 10*3/uL (ref 3.4–10.8)

## 2018-06-10 LAB — INHERITEST(R) CF/SMA PANEL

## 2018-06-26 ENCOUNTER — Other Ambulatory Visit: Payer: Medicaid Other

## 2018-06-26 ENCOUNTER — Encounter: Payer: Medicaid Other | Admitting: Obstetrics and Gynecology

## 2018-07-03 ENCOUNTER — Ambulatory Visit (HOSPITAL_COMMUNITY): Payer: Medicaid Other

## 2018-07-11 ENCOUNTER — Telehealth: Payer: Self-pay | Admitting: Obstetrics & Gynecology

## 2018-07-11 DIAGNOSIS — Z3480 Encounter for supervision of other normal pregnancy, unspecified trimester: Secondary | ICD-10-CM

## 2018-07-14 MED ORDER — DOCUSATE SODIUM 100 MG PO CAPS: 100.0000 mg | ORAL_CAPSULE | Freq: Two times a day (BID) | ORAL | 2 refills | Status: DC | PRN

## 2018-07-14 MED ORDER — PRENATAL 27-1 MG PO TABS: 1.0000 | ORAL_TABLET | Freq: Every day | ORAL | 12 refills | Status: DC

## 2018-07-14 NOTE — Telephone Encounter (Signed)
Interpreter [phone] used to call pt to inform her that the colace and prenatal were refilled and sent to her pharmacy and that she does not need a refill on the birthcontrol pill because she should no longer be taking it.    Pt did not pick up.  The home number connected to a different person and the mobile number went to voicemail.  Left message informing the pt that she was being contacted to return her call and if she had any questions to contact the office.

## 2018-07-23 ENCOUNTER — Other Ambulatory Visit: Payer: Medicaid Other

## 2018-07-23 ENCOUNTER — Encounter: Payer: Medicaid Other | Admitting: Medical

## 2018-07-23 ENCOUNTER — Telehealth: Payer: Self-pay | Admitting: Obstetrics and Gynecology

## 2018-07-23 ENCOUNTER — Telehealth: Payer: Self-pay | Admitting: Medical

## 2018-07-23 NOTE — Telephone Encounter (Signed)
Rescheduled the patient and also sending appointment reminders.

## 2018-07-23 NOTE — Telephone Encounter (Signed)
Patient stated she was unaware of her appointment time today. She would like to reschedule.

## 2018-07-24 ENCOUNTER — Other Ambulatory Visit: Payer: Medicaid Other

## 2018-07-24 ENCOUNTER — Encounter: Payer: Medicaid Other | Admitting: Obstetrics and Gynecology

## 2018-07-25 NOTE — Telephone Encounter (Signed)
Opened in error

## 2018-07-31 ENCOUNTER — Ambulatory Visit (HOSPITAL_COMMUNITY): Payer: Medicaid Other

## 2018-08-02 ENCOUNTER — Encounter (HOSPITAL_COMMUNITY): Payer: Self-pay | Admitting: Emergency Medicine

## 2018-08-02 ENCOUNTER — Emergency Department (HOSPITAL_COMMUNITY): Payer: Medicaid Other

## 2018-08-02 ENCOUNTER — Ambulatory Visit (HOSPITAL_COMMUNITY)
Admission: EM | Admit: 2018-08-02 | Discharge: 2018-08-02 | Disposition: A | Discharge status: Home / Self Care | Payer: Medicaid Other | Attending: Family Medicine | Admitting: Family Medicine

## 2018-08-02 ENCOUNTER — Other Ambulatory Visit: Payer: Self-pay

## 2018-08-02 ENCOUNTER — Inpatient Hospital Stay (HOSPITAL_COMMUNITY)
Admission: EM | Admit: 2018-08-02 | Discharge: 2018-08-15 | DRG: 831 | Disposition: A | Discharge status: Home / Self Care | Payer: Medicaid Other | Attending: Family Medicine | Admitting: Family Medicine

## 2018-08-02 DIAGNOSIS — D696 Thrombocytopenia, unspecified: Secondary | ICD-10-CM | POA: Diagnosis present

## 2018-08-02 DIAGNOSIS — R6889 Other general symptoms and signs: Secondary | ICD-10-CM

## 2018-08-02 DIAGNOSIS — A419 Sepsis, unspecified organism: Secondary | ICD-10-CM

## 2018-08-02 DIAGNOSIS — O9989 Other specified diseases and conditions complicating pregnancy, childbirth and the puerperium: Secondary | ICD-10-CM | POA: Diagnosis present

## 2018-08-02 DIAGNOSIS — R739 Hyperglycemia, unspecified: Secondary | ICD-10-CM | POA: Diagnosis not present

## 2018-08-02 DIAGNOSIS — Z3480 Encounter for supervision of other normal pregnancy, unspecified trimester: Secondary | ICD-10-CM

## 2018-08-02 DIAGNOSIS — I959 Hypotension, unspecified: Secondary | ICD-10-CM | POA: Diagnosis not present

## 2018-08-02 DIAGNOSIS — Z3A31 31 weeks gestation of pregnancy: Secondary | ICD-10-CM

## 2018-08-02 DIAGNOSIS — Z3A32 32 weeks gestation of pregnancy: Secondary | ICD-10-CM | POA: Diagnosis not present

## 2018-08-02 DIAGNOSIS — O99283 Endocrine, nutritional and metabolic diseases complicating pregnancy, third trimester: Secondary | ICD-10-CM | POA: Diagnosis present

## 2018-08-02 DIAGNOSIS — J9621 Acute and chronic respiratory failure with hypoxia: Secondary | ICD-10-CM | POA: Diagnosis not present

## 2018-08-02 DIAGNOSIS — K5901 Slow transit constipation: Secondary | ICD-10-CM | POA: Diagnosis not present

## 2018-08-02 DIAGNOSIS — R918 Other nonspecific abnormal finding of lung field: Secondary | ICD-10-CM | POA: Diagnosis not present

## 2018-08-02 DIAGNOSIS — K5900 Constipation, unspecified: Secondary | ICD-10-CM | POA: Diagnosis not present

## 2018-08-02 DIAGNOSIS — E876 Hypokalemia: Secondary | ICD-10-CM | POA: Diagnosis present

## 2018-08-02 DIAGNOSIS — Z603 Acculturation difficulty: Secondary | ICD-10-CM | POA: Diagnosis present

## 2018-08-02 DIAGNOSIS — R069 Unspecified abnormalities of breathing: Secondary | ICD-10-CM

## 2018-08-02 DIAGNOSIS — J189 Pneumonia, unspecified organism: Secondary | ICD-10-CM | POA: Diagnosis not present

## 2018-08-02 DIAGNOSIS — J988 Other specified respiratory disorders: Secondary | ICD-10-CM | POA: Diagnosis not present

## 2018-08-02 DIAGNOSIS — J9601 Acute respiratory failure with hypoxia: Secondary | ICD-10-CM

## 2018-08-02 DIAGNOSIS — O99113 Other diseases of the blood and blood-forming organs and certain disorders involving the immune mechanism complicating pregnancy, third trimester: Secondary | ICD-10-CM | POA: Diagnosis present

## 2018-08-02 DIAGNOSIS — O98513 Other viral diseases complicating pregnancy, third trimester: Principal | ICD-10-CM | POA: Diagnosis present

## 2018-08-02 DIAGNOSIS — O0930 Supervision of pregnancy with insufficient antenatal care, unspecified trimester: Secondary | ICD-10-CM

## 2018-08-02 DIAGNOSIS — U071 COVID-19: Secondary | ICD-10-CM

## 2018-08-02 DIAGNOSIS — O99513 Diseases of the respiratory system complicating pregnancy, third trimester: Secondary | ICD-10-CM | POA: Diagnosis present

## 2018-08-02 DIAGNOSIS — O0933 Supervision of pregnancy with insufficient antenatal care, third trimester: Secondary | ICD-10-CM | POA: Diagnosis not present

## 2018-08-02 DIAGNOSIS — R0902 Hypoxemia: Secondary | ICD-10-CM | POA: Diagnosis not present

## 2018-08-02 DIAGNOSIS — A4189 Other specified sepsis: Secondary | ICD-10-CM | POA: Diagnosis present

## 2018-08-02 DIAGNOSIS — Z789 Other specified health status: Secondary | ICD-10-CM | POA: Diagnosis present

## 2018-08-02 DIAGNOSIS — R0602 Shortness of breath: Secondary | ICD-10-CM

## 2018-08-02 DIAGNOSIS — Z3A30 30 weeks gestation of pregnancy: Secondary | ICD-10-CM

## 2018-08-02 DIAGNOSIS — T380X5A Adverse effect of glucocorticoids and synthetic analogues, initial encounter: Secondary | ICD-10-CM | POA: Diagnosis present

## 2018-08-02 DIAGNOSIS — Z20822 Contact with and (suspected) exposure to covid-19: Secondary | ICD-10-CM

## 2018-08-02 DIAGNOSIS — J1289 Other viral pneumonia: Secondary | ICD-10-CM | POA: Diagnosis not present

## 2018-08-02 LAB — CBC WITH DIFFERENTIAL/PLATELET
Abs Immature Granulocytes: 0.03 10*3/uL (ref 0.00–0.07)
Basophils Absolute: 0 10*3/uL (ref 0.0–0.1)
Basophils Relative: 0 %
Eosinophils Absolute: 0 10*3/uL (ref 0.0–0.5)
Eosinophils Relative: 1 %
HCT: 29.9 % — ABNORMAL LOW (ref 36.0–46.0)
Hemoglobin: 9.6 g/dL — ABNORMAL LOW (ref 12.0–15.0)
Immature Granulocytes: 1 %
Lymphocytes Relative: 14 %
Lymphs Abs: 0.9 10*3/uL (ref 0.7–4.0)
MCH: 29.8 pg (ref 26.0–34.0)
MCHC: 32.1 g/dL (ref 30.0–36.0)
MCV: 92.9 fL (ref 80.0–100.0)
Monocytes Absolute: 0.4 10*3/uL (ref 0.1–1.0)
Monocytes Relative: 6 %
Neutro Abs: 5.2 10*3/uL (ref 1.7–7.7)
Neutrophils Relative %: 78 %
Platelets: 132 10*3/uL — ABNORMAL LOW (ref 150–400)
RBC: 3.22 MIL/uL — ABNORMAL LOW (ref 3.87–5.11)
RDW: 13.7 % (ref 11.5–15.5)
WBC: 6.6 10*3/uL (ref 4.0–10.5)
nRBC: 0 % (ref 0.0–0.2)

## 2018-08-02 LAB — LACTIC ACID, PLASMA: Lactic Acid, Venous: 0.8 mmol/L (ref 0.5–1.9)

## 2018-08-02 LAB — COMPREHENSIVE METABOLIC PANEL
ALT: 21 U/L (ref 0–44)
AST: 25 U/L (ref 15–41)
Albumin: 2.9 g/dL — ABNORMAL LOW (ref 3.5–5.0)
Alkaline Phosphatase: 70 U/L (ref 38–126)
Anion gap: 11 (ref 5–15)
BUN: 7 mg/dL (ref 6–20)
CO2: 19 mmol/L — ABNORMAL LOW (ref 22–32)
Calcium: 7.8 mg/dL — ABNORMAL LOW (ref 8.9–10.3)
Chloride: 101 mmol/L (ref 98–111)
Creatinine, Ser: 0.5 mg/dL (ref 0.44–1.00)
GFR calc Af Amer: >60 mL/min (ref >60)
GFR calc non Af Amer: >60 mL/min (ref >60)
Glucose, Bld: 96 mg/dL (ref 70–99)
Potassium: 2.8 mmol/L — ABNORMAL LOW (ref 3.5–5.1)
Sodium: 131 mmol/L — ABNORMAL LOW (ref 135–145)
Total Bilirubin: 0.4 mg/dL (ref 0.3–1.2)
Total Protein: 6.2 g/dL — ABNORMAL LOW (ref 6.5–8.1)

## 2018-08-02 LAB — SARS CORONAVIRUS 2 BY RT PCR (HOSPITAL ORDER, PERFORMED IN ~~LOC~~ HOSPITAL LAB): SARS Coronavirus 2: POSITIVE — AB

## 2018-08-02 MED ORDER — LACTATED RINGERS IV BOLUS
1000.0000 mL | Freq: Once | INTRAVENOUS | Status: AC
  Administered 2018-08-02: 1000 mL via INTRAVENOUS

## 2018-08-02 MED ORDER — SODIUM CHLORIDE 0.9% FLUSH
3.0000 mL | Freq: Two times a day (BID) | INTRAVENOUS | Status: DC
  Administered 2018-08-02 – 2018-08-15 (×20): 3 mL via INTRAVENOUS

## 2018-08-02 MED ORDER — ENOXAPARIN SODIUM 40 MG/0.4ML ~~LOC~~ SOLN
40.0000 mg | SUBCUTANEOUS | Status: DC
  Administered 2018-08-03: 40 mg via SUBCUTANEOUS
  Filled 2018-08-02: qty 0.4

## 2018-08-02 MED ORDER — POTASSIUM CHLORIDE CRYS ER 20 MEQ PO TBCR
40.0000 meq | EXTENDED_RELEASE_TABLET | Freq: Once | ORAL | Status: AC
  Administered 2018-08-02: 40 meq via ORAL
  Filled 2018-08-02: qty 2

## 2018-08-02 MED ORDER — ACETAMINOPHEN 325 MG PO TABS
650.0000 mg | ORAL_TABLET | Freq: Four times a day (QID) | ORAL | Status: DC | PRN
  Administered 2018-08-02 – 2018-08-03 (×3): 650 mg via ORAL
  Filled 2018-08-02 (×3): qty 2

## 2018-08-02 MED ORDER — POTASSIUM CHLORIDE 10 MEQ/100ML IV SOLN
10.0000 meq | Freq: Once | INTRAVENOUS | Status: AC
  Administered 2018-08-02: 10 meq via INTRAVENOUS
  Filled 2018-08-02: qty 100

## 2018-08-02 MED ORDER — SODIUM CHLORIDE 0.9 % IV SOLN
INTRAVENOUS | Status: AC
  Administered 2018-08-03 – 2018-08-05 (×8): via INTRAVENOUS

## 2018-08-02 MED ORDER — POLYETHYLENE GLYCOL 3350 17 G PO PACK: 17.0000 g | PACK | Freq: Every day | ORAL | Status: DC | PRN

## 2018-08-02 MED ORDER — PRENATAL MULTIVITAMIN CH
1.0000 | ORAL_TABLET | Freq: Every day | ORAL | Status: DC
  Administered 2018-08-03 – 2018-08-08 (×6): 1 via ORAL
  Filled 2018-08-02 (×10): qty 1

## 2018-08-02 NOTE — ED Notes (Signed)
ED TO INPATIENT HANDOFF REPORT  ED Nurse Name and Phone #:  Raina Mina [phone]  S Name/Age/Gender Alicia Riley 38 y.o. female Room/Bed: 016C/016C  Code Status   Code Status: Prior  Home/SNF/Other Home Patient oriented to: self, place, time and situation Is this baseline? Yes   Triage Complete: Triage complete  Chief Complaint pregnant /cvoid ?  Triage Note Pt was seen at urgent care with covid symptoms. Urgent care RN states pts heart rate was tachy in 130s.   Last 95          Intake/Output Last 24 hours No intake or output data in the 24 hours ending 08/02/18 2155  Labs/Imaging Results for orders placed or performed during the hospital encounter of 08/02/18 (from the past 48 hour(s))  CBC with Differential     Status: Abnormal   Collection Time: 08/02/18  6:18 PM  Result Value Ref Range   WBC 6.6 4.0 - 10.5 K/uL   RBC 3.22 (L) 3.87 - 5.11 MIL/uL   Hemoglobin 9.6 (L) 12.0 - 15.0 g/dL   HCT 45.4 (L) 09.8 - 11.9 %   MCV 92.9 80.0 - 100.0 fL   MCH 29.8 26.0 - 34.0 pg   MCHC 32.1 30.0 - 36.0 g/dL   RDW 14.7 82.9 - 56.2 %   Platelets 132 (L) 150 - 400 K/uL   nRBC 0.0 0.0 - 0.2 %   Neutrophils Relative % 78 %   Neutro Abs 5.2 1.7 - 7.7 K/uL   Lymphocytes Relative 14 %   Lymphs Abs 0.9 0.7 - 4.0 K/uL   Monocytes Relative 6 %   Monocytes Absolute  0.4 0.1 - 1.0 K/uL   Eosinophils Relative 1 %   Eosinophils Absolute 0.0 0.0 - 0.5 K/uL   Basophils Relative 0 %   Basophils Absolute 0.0 0.0 - 0.1 K/uL   Immature Granulocytes 1 %   Abs Immature Granulocytes 0.03 0.00 - 0.07 K/uL    Comment: Performed at [facility], [address] [place], [place] [address]  Comprehensive metabolic panel     Status: Abnormal   Collection Time: 08/02/18  6:18 PM  Result Value Ref Range   Sodium 131 (L) 135 - 145 mmol/L   Potassium 2.8 (L) 3.5 - 5.1 mmol/L   Chloride 101 98 - 111 mmol/L   CO2 19 (L) 22 - 32 mmol/L   Glucose, Bld 96 70 - 99 mg/dL   BUN 7 6 - 20 mg/dL   Creatinine, Ser 6.040.50 0.44 - 1.00 mg/dL   Calcium 7.8 (L) 8.9 - 10.3 mg/dL   Total Protein 6.2 (L) 6.5 - 8.1 g/dL   Albumin 2.9 (L) 3.5 - 5.0 g/dL   AST 25 15 - 41 U/L   ALT 21 0 - 44 U/L   Alkaline Phosphatase 70 38 - 126 U/L   Total Bilirubin 0.4 0.3 - 1.2 mg/dL    GFR calc non Af Amer >60 >60 mL/min   GFR calc Af Amer >60 >60 mL/min   Anion gap 11 5 - 15    Comment: Performed at [facility], [address] [place], [place] [address]  SARS Coronavirus 2 (CEPHEID- Performed in [facility] hospital lab), Hosp Order     Status: Abnormal   Collection Time: 08/02/18  6:34 PM  Result Value Ref Range   SARS Coronavirus 2 POSITIVE (A) NEGATIVE    Comment: RESULT CALLED TO, READ BACK BY AND VERIFIED WITH: A DENNIS,RN AT 1959 08/02/2018 BY L BENFIELD (NOTE) If result is NEGATIVE SARS-CoV-2 target nucleic acids are NOT DETECTED. The SARS-CoV-2 RNA is generally detectable in upper and lower  respiratory specimens during the acute phase of infection. The lowest  concentration of SARS-CoV-2 viral copies this assay can detect is 250  copies / mL. A negative result does not preclude SARS-CoV-2 infection  and should not be used as the sole basis for treatment or other  patient management decisions.  A negative result may occur with  improper specimen collection / handling, submission of specimen other  than nasopharyngeal swab, presence of viral mutation(s) within the  areas targeted by this assay, and inadequate number of viral copies  (<250 copies / mL). A negative result must be combined with clinical  observations, patient history, and epidemiological information. If result is POSITIVE SARS-CoV-2 target nucleic acids are DETEC TED. The SARS-CoV-2 RNA is generally detectable in upper and lower  respiratory specimens during the acute phase of infection.  Positive  results are indicative of active infection with SARS-CoV-2.  Clinical  correlation with patient history and other diagnostic information is  necessary to determine patient infection status.  Positive results do  not rule out bacterial infection or co-infection with other viruses. If result is PRESUMPTIVE POSTIVE SARS-CoV-2 nucleic acids MAY BE PRESENT.   A presumptive positive result was  obtained on the submitted specimen  and confirmed on repeat testing.  While 2019 novel coronavirus  (SARS-CoV-2) nucleic acids may be present in the submitted sample  additional confirmatory testing may be necessary for epidemiological  and / or clinical management purposes  to differentiate between  SARS-CoV-2 and other Sarbecovirus  currently known to infect humans.  If clinically indicated additional testing with an alternate test  methodology (LAB7 453) is advised. The SARS-CoV-2 RNA is generally  detectable in upper and lower respiratory specimens during the acute  phase of infection. The expected result is Negative. Fact Sheet for Patients:  BoilerBrush.com.cy Fact Sheet for Healthcare Providers: https://pope.com/ This test is not yet approved or cleared by the [place] FDA and has been authorized for detection and/or diagnosis of SARS-CoV-2 by FDA under an Emergency Use Authorization (EUA).  This EUA will remain in effect (meaning this test can be used) for the duration of the COVID-19 declaration under Section 564(b)(1) of the Act, 21 U.S.C. section 360bbb-3(b)(1), unless the authorization is terminated or revoked sooner. Performed at [facility], [address] [place], [place] [address]   Lactic acid, plasma     Status: None   Collection Time: 08/02/18  9:26 PM  Result Value Ref Range   Lactic Acid, Venous 0.8 0.5 - 1.9 mmol/L    Comment: Performed at [facility], [address], [place], [place] [address]   Dg Chest Portable 1 View  Result Date: 08/02/2018 CLINICAL DATA:  Fever and cough with positive COVID-19 test EXAM: PORTABLE CHEST 1 VIEW COMPARISON:  10/20/2013 FINDINGS: Cardiac shadows within normal limits. The lungs are hypoinflated with bibasilar opacities. No sizable effusion is seen. No bony abnormality is noted. IMPRESSION: Bibasilar opacities and poor inspiratory effort. Electronically Signed    By: Alcide Clever M.D.   On: 08/02/2018 19:03    Pending Labs Unresulted Labs (From admission, onward)    Start     Ordered   08/02/18 2038  Lactic acid, plasma  STAT Now then every 3 hours,   R     08/02/18 2037   Signed and Held  Creatinine, serum  (enoxaparin (LOVENOX)    CrCl >/= 30 ml/min)  Weekly,   R    Comments:  while on enoxaparin therapy    Signed and Held   Signed and Held  CBC with Differential/Platelet  Daily,   R     Signed and Held   Signed and Held  Comprehensive metabolic panel  Daily,   R     Signed and Held   Signed and Held  Basic metabolic panel  Once-Timed,   R     Signed and Held          Vitals/Pain Today's Vitals   08/02/18 1749 08/02/18 1950 08/02/18 1956 08/02/18 2000  BP:   (!) 89/51 (!) 85/59  Pulse:    (!) 107  Resp:   (!) 23 19  Temp:  99.4 F (37.4 C)    TempSrc:  Oral    SpO2: 95%   94%    Isolation Precautions Droplet and Contact precautions  Medications Medications  acetaminophen (TYLENOL) tablet 650 mg (650 mg Oral Given 08/02/18 1825)  lactated ringers bolus 1,000 mL (has no administration in time range)  lactated ringers bolus 1,000 mL (1,000 mLs Intravenous New Bag/Given 08/02/18 1815)  potassium chloride 10 mEq in 100 mL IVPB (0 mEq Intravenous Stopped 08/02/18 2116)  potassium chloride SA (K-DUR) CR tablet 40 mEq (40 mEq Oral Given 08/02/18 1951)    Mobility walks     Focused Assessments Pulmonary Assessment Handoff:  Lung sounds:   O2 Device: Room Air        R Recommendations: See Admitting Provider Note  Report given to:   Additional Notes:  Needs burmese  interpreter

## 2018-08-02 NOTE — ED Triage Notes (Addendum)
Pt was seen at urgent care with covid symptoms. Urgent care RN states pts heart rate was tachy in 130s.   Last Sunday symptoms started Yesterday SOB started.  Pt co abd pain that started at 1200 when she coughs . Denies LOF, vag bleeding. +FM. Denies NVD.

## 2018-08-02 NOTE — H&P (Addendum)
Family Medicine Teaching [facility] Admission History and Physical Service Pager: [phone]  Patient name: Alicia Riley Medical record number: [id number] Date of birth: [birth year] Age: 38 y.o. Gender: female  Primary Care Provider: Patient, No Pcp Per Consultants: OB Code Status: full Preferred Emergency Contact: husband "Om-lah" [phone]  Chief Complaint: SOB  Assessment and Plan: Alicia Riley is a 37 y.o. female presenting with SOB. PMH is significant for active pregnancy at [redacted] weeks gestation  Sepsis secondary to COVID-19 infection- patient tachycardic and hypotensive in ED, febrile to 102.1 upon presentation. BP and tachycardia only minimally improved with 2L bolus of NS. Positive sick contact in husband. Patient w/ cough since yesterday, SOB starting today that is worsening. Creatinine stable. Ast/alt WNL. No leukocytosis. Given persistent tachycardia and hypotension (MAP of 60) patient requires admission. -admit to covid floor, attending Dr. Jennette Kettle -continuous pulse ox and telemetry -IVMF at 150 cc/hr -tylenol as needed for fever or body aches -paged CCM to [facility] given relative hypotension   Hypokalemia- potassium 2.8 in ED, given 10 meq IV potassium and 40 meq oral potassium -repeat BMP at midnight  G4P2 at [redacted]w[redacted]d gestational age- fetal HR reassuring in ED, no vaginal bleeding, contractions, LOF. Good fetal movement. [place] from 3/11 recommended 4 week growth [place] follow up but this not done and patient has not been seen for follow up, likely due to communication barriers. Last visit at 21 weeks at [facility]. -OB following, appreciate recommendations -patient would not be able to self prone if needed  FEN/GI: regular diet, IVMF Prophylaxis: lovenox  Disposition: admit to inpatient  History of Present Illness:  Alicia Riley is a 38 y.o. female presenting with shortness of breath, cough, fever.  Cough for since yesterday, worse today, uncomfortable w/ breathing. Chest pains on left  side. Went to UC and was febrile so sent to ED. SOB starting today that is worsening. Poor PO intake over the past few days. Husband has been sick since last Sunday, took off but then returned to work [employer]. Reports baby moving well, no vaginal bleeding or leaking fluids, no contractions. She has two older children who do not seem to be sick.   Review Of Systems: Per HPI with the following additions:   Review of Systems  Constitutional: Positive for chills and fever.  HENT: Positive for sore throat. Negative for congestion.   Eyes: Negative for pain and redness.  Respiratory: Positive for cough and shortness of breath. Negative for hemoptysis and sputum production.   Cardiovascular: Positive for chest pain.  Gastrointestinal: Negative for abdominal pain, nausea and vomiting.  Genitourinary: Negative for dysuria and urgency.  Musculoskeletal: Positive for myalgias.  Skin: Negative for rash.  Neurological: Negative for weakness.    Patient Active Problem List   Diagnosis Date Noted  . COVID-19 08/02/2018  . Supervision of other normal pregnancy, antepartum 05/29/2018  . Late prenatal care affecting pregnancy 05/29/2018  . Language barrier affecting health care 05/11/2013  . Cervical polyp 05/11/2013    Past Medical History: Past Medical History:  Diagnosis Date  . Medical history non-contributory     Past Surgical History: Past Surgical History:  Procedure Laterality Date  . DILATION AND EVACUATION N/A 08/03/2015   Procedure: DILATATION AND EVACUATION;  Surgeon: [name], MD;  Location: [facility];  Service: Gynecology;  Laterality: N/A;  . NO PAST SURGERIES      Social History: Social History   Tobacco Use  . Smoking status: Never Smoker  . Smokeless tobacco: Never Used  Substance Use Topics  . Alcohol use: No  . Drug use: No   Additional social history: speaks burmese, husband and 2 children  Please also refer to relevant sections of EMR.  Family History: No  family history on file.  Allergies and Medications: No Known Allergies No current facility-administered medications on file prior to encounter.    Current Outpatient Medications on File Prior to Encounter  Medication Sig Dispense Refill  . docusate sodium (COLACE) 100 MG capsule Take 1 capsule (100 mg total) by mouth 2 (two) times daily as needed. 30 capsule 2  . ibuprofen (ADVIL,MOTRIN) 600 MG tablet Take 1 tablet (600 mg total) by mouth every 6 (six) hours. 30 tablet 0  . norgestimate-ethinyl estradiol (MONONESSA) 0.25-35 MG-MCG tablet Take 1 tablet by mouth daily. 1 Package 11  . Prenatal 27-1 MG TABS Take 1 tablet by mouth daily. 30 each 12  . Prenatal Vit-Fe Fumarate-FA (PRENATAL MULTIVITAMIN) TABS tablet Take 1 tablet by mouth daily at 12 noon.      Objective: BP (!) 85/59   Pulse (!) 107   Temp 99.4 F (37.4 C) (Oral)   Resp 19   LMP 01/02/2018 (Exact Date)   SpO2 94%  Exam: General: female sitting up in bed in no distress Eyes: EOMI, PERRLA ENTM: MMM Neck: supple Cardiovascular: tachycardia, no murmur Respiratory: coarse breath sounds bilaterally, coughing, good air movement  Gastrointestinal: gravid abdomen, nontender MSK: negative homans sign bilaterally, no calf pain  Derm: no rashes on visualized skin  Neuro: CN II-XII grossly intact Psych: mood and affect appropriate, speaks Burmese  Labs and Imaging: CBC BMET  Recent Labs  Lab 08/02/18 1818  WBC 6.6  HGB 9.6*  HCT 29.9*  PLT 132*   Recent Labs  Lab 08/02/18 1818  NA 131*  K 2.8*  CL 101  CO2 19*  BUN 7  CREATININE 0.50  GLUCOSE 96  CALCIUM 7.8*    Dg Chest Portable 1 View  Result Date: 08/02/2018 CLINICAL DATA:  Fever and cough with positive COVID-19 test EXAM: PORTABLE CHEST 1 VIEW COMPARISON:  10/20/2013 FINDINGS: Cardiac shadows within normal limits. The lungs are hypoinflated with bibasilar opacities. No sizable effusion is seen. No bony abnormality is noted. IMPRESSION: Bibasilar opacities  and poor inspiratory effort. Electronically Signed   By: Alcide CleverMark  Lukens M.D.   On: 08/02/2018 19:03     Garth Bignessimberlake, , MD 08/02/2018, 9:48 PM PGY-3, [facility] FPTS Intern pager: [phone], text pages welcome

## 2018-08-02 NOTE — ED Notes (Signed)
RN attempted to call report. 2W concerned d/t pt being pregnant.

## 2018-08-02 NOTE — ED Notes (Signed)
Taken to ED.

## 2018-08-02 NOTE — Progress Notes (Signed)
Spoke with Dr. Debroah Loop. FHR baseline is 160 BPM, moderate variability, accels, no decels. NO uc's. No vaginal bleeding or leaking of fluid. Pt is OB cleared and DRr. Debroah Loop can be reached at [phone] for any questions or concerns.

## 2018-08-02 NOTE — Discharge Instructions (Signed)
Patient who is ~[redacted] weeks pregnant with 1 week history of COVID symptoms. Now with tachycardia and shortness of breath. Given history, will discharge to ED for further evaluation needed.

## 2018-08-02 NOTE — Progress Notes (Signed)
Pt is a G4P2 at 30 2/[redacted] weeks gestation here with c/o fever and cough. Pt tested positive for covid 19 at urgent care today. No vaginal bleeding or leaking of fluid. Pt says through interpreter that she has had no problems with this pregnancy. She gets her care at [facility]. She has had all vaginal births.

## 2018-08-02 NOTE — ED Triage Notes (Signed)
Pt here for cough and fever x 1 week with body aches; her husband had similar sx prior to her; she is currently 8 months pregnant

## 2018-08-02 NOTE — ED Provider Notes (Addendum)
38 year old female approximately [redacted] weeks pregnant comes in for 1 week history of cough and fever. HPI obtained by patient through friend translating, was unable to reach video and phone translator. She has also had rhinorrhea, nasal congestion, sore throat, body aches. States today, started having shortness of breath and came in. Denies chest pain. Husband with similar symptoms.   Patient with temp of 100.1 and is found tachycardic at 119. O2 sat 96%.  She is sitting comfortably on exam table without acute distress.  Speaking in full sentences without difficulty. Normal work of breathing. Alert and oriented x 4  Given history, discussed case with Dr Johnn Hai, who suggested further evaluation at the ED. Patient discharged in stable condition to the ED for further evaluation and management needed.    Belinda Fisher, PA-C 08/02/18 1735    Belinda Fisher, PA-C 08/02/18 1738

## 2018-08-02 NOTE — ED Notes (Signed)
RN attempted handoff report

## 2018-08-02 NOTE — ED Provider Notes (Signed)
[facility] Provider Note   CSN: [id number] Arrival date & time: 08/02/18  1735    History   Chief Complaint Chief Complaint  Patient presents with  . covid   The history is limited by a language barrier. A language interpreter was used.   38 year old female G4P2 who is currently [redacted] weeks pregnant presents with cough and fever.  Patient speaks Burmese and an interpreter was used via history.  Patient reports that her symptoms started on Sunday with cough and sore throat.  Symptoms initially improved and then returned on Thursday.  She reports that she felt short of breath yesterday.  Denies contractions, vaginal bleeding, or loss of fluid.  No headache or vision changes.  She does endorse body aches.  She was seen in urgent care found to be febrile and sent to the ED for further evaluation.  Denies sick contacts but does report that her husband is still working.  Past Medical History:  Diagnosis Date  . Medical history non-contributory     Patient Active Problem List   Diagnosis Date Noted  . COVID-19 08/02/2018  . Supervision of other normal pregnancy, antepartum 05/29/2018  . Late prenatal care affecting pregnancy 05/29/2018  . Language barrier affecting health care 05/11/2013  . Cervical polyp 05/11/2013    Past Surgical History:  Procedure Laterality Date  . DILATION AND EVACUATION N/A 08/03/2015   Procedure: DILATATION AND EVACUATION;  Surgeon: Tilda BurrowJohn Ferguson V, MD;  Location: [facility];  Service: Gynecology;  Laterality: N/A;  . NO PAST SURGERIES       OB History    Gravida  4   Para  2   Term  2   Preterm      AB  1   Living  2     SAB  1   TAB      Ectopic      Multiple      Live Births  2            Home Medications    Prior to Admission medications   Medication Sig Start Date End Date Taking? Authorizing Provider  Prenatal Vit-Fe Fumarate-FA (PRENATAL MULTIVITAMIN) TABS tablet Take 1 tablet by mouth  daily.    Yes [provider]    Family History No family history on file.  Social History Social History   Tobacco Use  . Smoking status: Never Smoker  . Smokeless tobacco: Never Used  Substance Use Topics  . Alcohol use: No  . Drug use: No     Allergies   Patient has no known allergies.   Review of Systems Review of Systems  Constitutional: Positive for fever. Negative for chills.  HENT: Positive for sore throat. Negative for ear pain.   Eyes: Negative for pain and visual disturbance.  Respiratory: Positive for cough. Negative for shortness of breath.   Cardiovascular: Negative for chest pain and palpitations.  Gastrointestinal: Negative for abdominal pain and vomiting.  Genitourinary: Negative for dysuria and hematuria.  Musculoskeletal: Negative for arthralgias and back pain.  Skin: Negative for color change and rash.  Neurological: Negative for seizures and syncope.  All other systems reviewed and are negative.    Physical Exam Updated Vital Signs BP 94/68   Pulse (!) 119   Temp 99.4 F (37.4 C) (Oral)   Resp 20   LMP 01/02/2018 (Exact Date)   SpO2 100%   Physical Exam Vitals signs and nursing note reviewed.  Constitutional:  General: She is not in acute distress.    Appearance: She is well-developed. She is not toxic-appearing.  HENT:     Head: Normocephalic and atraumatic.     Right Ear: External ear normal.     Left Ear: External ear normal.     Mouth/Throat:     Mouth: Mucous membranes are moist.     Pharynx: No oropharyngeal exudate or posterior oropharyngeal erythema.     Comments: No oropharyngeal edema or PTA Eyes:     Conjunctiva/sclera: Conjunctivae normal.  Neck:     Musculoskeletal: Neck supple.  Cardiovascular:     Rate and Rhythm: Regular rhythm. Tachycardia present.     Heart sounds: No murmur.  Pulmonary:     Effort: Pulmonary effort is normal. No respiratory distress.     Breath sounds: Normal breath sounds.   Abdominal:     General: There is no distension.     Palpations: Abdomen is soft. There is no mass.     Tenderness: There is no abdominal tenderness.     Comments: Gravid with no tenderness to palpation  Skin:    General: Skin is warm and dry.  Neurological:     General: No focal deficit present.     Mental Status: She is alert.      ED Treatments / Results  Labs (all labs ordered are listed, but only abnormal results are displayed) Labs Reviewed  SARS CORONAVIRUS 2 (HOSPITAL ORDER, PERFORMED IN [facility]) - Abnormal; Notable for the following components:      Result Value   SARS Coronavirus 2 POSITIVE (*)    All other components within normal limits  CBC WITH DIFFERENTIAL/PLATELET - Abnormal; Notable for the following components:   RBC 3.22 (*)    Hemoglobin 9.6 (*)    HCT 29.9 (*)    Platelets 132 (*)    All other components within normal limits  COMPREHENSIVE METABOLIC PANEL - Abnormal; Notable for the following components:   Sodium 131 (*)    Potassium 2.8 (*)    CO2 19 (*)    Calcium 7.8 (*)    Total Protein 6.2 (*)    Albumin 2.9 (*)    All other components within normal limits  LACTIC ACID, PLASMA    EKG EKG Interpretation  Date/Time:  Saturday Aug 02 2018 17:43:45 EDT Ventricular Rate:  116 PR Interval:    QRS Duration: 111 QT Interval:  325 QTC Calculation: 452 R Axis:   62 Text Interpretation:  Sinus tachycardia Consider left atrial enlargement RSR' in V1 or V2, right VCD or RVH Confirmed by Kennis Carina [phone]) on 08/02/2018 6:06:35 PM   Radiology Dg Chest Portable 1 View  Result Date: 08/02/2018 CLINICAL DATA:  Fever and cough with positive COVID-19 test EXAM: PORTABLE CHEST 1 VIEW COMPARISON:  10/20/2013 FINDINGS: Cardiac shadows within normal limits. The lungs are hypoinflated with bibasilar opacities. No sizable effusion is seen. No bony abnormality is noted. IMPRESSION: Bibasilar opacities and poor inspiratory effort. Electronically  Signed   By: Alcide Clever M.D.   On: 08/02/2018 19:03    Procedures Procedures (including critical care time)  Medications Ordered in ED Medications  acetaminophen (TYLENOL) tablet 650 mg (650 mg Oral Given 08/02/18 1825)  lactated ringers bolus 1,000 mL (1,000 mLs Intravenous New Bag/Given 08/02/18 1815)  potassium chloride 10 mEq in 100 mL IVPB (0 mEq Intravenous Stopped 08/02/18 2116)  potassium chloride SA (K-DUR) CR tablet 40 mEq (40 mEq Oral Given 08/02/18 1951)  lactated ringers bolus 1,000 mL (0 mLs Intravenous Stopped 08/02/18 2200)     Initial Impression / Assessment and Plan / ED Course  I have reviewed the triage vital signs and the nursing notes.  Pertinent labs & imaging results that were available during my care of the patient were reviewed by me and considered in my medical decision making (see chart for details).  38 year old female G4P2 who is currently [redacted] weeks pregnant presents with cough and fever.  Patient tachycardic on arrival.  Normal SPO2 on room air.  Clear to auscultation bilaterally.  After discussion and shared decision making with the patient, will obtain chest x-ray to evaluate for pneumonia. Bolus fluids and Tylenol given.   Fetal heart rate baseline is 160 bpm.  Patient having moderate variability, accelerations with no decels. No uterine contractions.  Patient denying any vaginal bleeding or leakage of fluid.  Patient has not had any nausea or vomiting and she is tolerating p.o.  No abdominal tenderness.  No neck pain or meningismus to suggest meningitis.  Labs notable for WBC 6.6, hemoglobin 9.6 from 10.8 months ago.  Platelets 132.  Sodium 131, potassium 2.8, CO2 19.  Potassium repleted with IV and p.o. K.   X-ray shows bibasilar opacities.   Patient with 2 readings of hypotension with systolics in the 80s.  Additional bolus of fluids given.  Due to lab values concerning for some level of dehydration and her continued tachycardia despite Tylenol and  fluids, will admit for further management.  Dr. Debroah Loop with OB/GYN notified.  Patient admitted to family medicine.   Final Clinical Impressions(s) / ED Diagnoses   Final diagnoses:  None    ED Discharge Orders    None       Vallery Ridge, MD 08/02/18 2221    Sabas Sous, MD 08/02/18 2247

## 2018-08-02 NOTE — ED Notes (Addendum)
RN attempted to give handoff. 2W consulting [facility] d/t concerns for bp.

## 2018-08-03 DIAGNOSIS — A419 Sepsis, unspecified organism: Secondary | ICD-10-CM

## 2018-08-03 DIAGNOSIS — Z3480 Encounter for supervision of other normal pregnancy, unspecified trimester: Secondary | ICD-10-CM

## 2018-08-03 DIAGNOSIS — U071 COVID-19: Secondary | ICD-10-CM

## 2018-08-03 DIAGNOSIS — I959 Hypotension, unspecified: Secondary | ICD-10-CM

## 2018-08-03 DIAGNOSIS — O0930 Supervision of pregnancy with insufficient antenatal care, unspecified trimester: Secondary | ICD-10-CM

## 2018-08-03 DIAGNOSIS — J988 Other specified respiratory disorders: Secondary | ICD-10-CM

## 2018-08-03 LAB — COMPREHENSIVE METABOLIC PANEL
ALT: 18 U/L (ref 0–44)
AST: 25 U/L (ref 15–41)
Albumin: 2.6 g/dL — ABNORMAL LOW (ref 3.5–5.0)
Alkaline Phosphatase: 63 U/L (ref 38–126)
Anion gap: 10 (ref 5–15)
BUN: <5 mg/dL — ABNORMAL LOW (ref 6–20)
CO2: 18 mmol/L — ABNORMAL LOW (ref 22–32)
Calcium: 7.9 mg/dL — ABNORMAL LOW (ref 8.9–10.3)
Chloride: 107 mmol/L (ref 98–111)
Creatinine, Ser: 0.45 mg/dL (ref 0.44–1.00)
GFR calc Af Amer: >60 mL/min (ref >60)
GFR calc non Af Amer: >60 mL/min (ref >60)
Glucose, Bld: 95 mg/dL (ref 70–99)
Potassium: 3.1 mmol/L — ABNORMAL LOW (ref 3.5–5.1)
Sodium: 135 mmol/L (ref 135–145)
Total Bilirubin: 0.6 mg/dL (ref 0.3–1.2)
Total Protein: 5.7 g/dL — ABNORMAL LOW (ref 6.5–8.1)

## 2018-08-03 LAB — CBC WITH DIFFERENTIAL/PLATELET
Abs Immature Granulocytes: 0.04 10*3/uL (ref 0.00–0.07)
Basophils Absolute: 0 10*3/uL (ref 0.0–0.1)
Basophils Relative: 0 %
Eosinophils Absolute: 0 10*3/uL (ref 0.0–0.5)
Eosinophils Relative: 0 %
HCT: 28.1 % — ABNORMAL LOW (ref 36.0–46.0)
Hemoglobin: 9.4 g/dL — ABNORMAL LOW (ref 12.0–15.0)
Immature Granulocytes: 1 %
Lymphocytes Relative: 13 %
Lymphs Abs: 0.8 10*3/uL (ref 0.7–4.0)
MCH: 29.9 pg (ref 26.0–34.0)
MCHC: 33.5 g/dL (ref 30.0–36.0)
MCV: 89.5 fL (ref 80.0–100.0)
Monocytes Absolute: 0.4 10*3/uL (ref 0.1–1.0)
Monocytes Relative: 6 %
Neutro Abs: 5.1 10*3/uL (ref 1.7–7.7)
Neutrophils Relative %: 80 %
Platelets: 120 10*3/uL — ABNORMAL LOW (ref 150–400)
RBC: 3.14 MIL/uL — ABNORMAL LOW (ref 3.87–5.11)
RDW: 13.8 % (ref 11.5–15.5)
WBC: 6.4 10*3/uL (ref 4.0–10.5)
nRBC: 0 % (ref 0.0–0.2)

## 2018-08-03 LAB — GLUCOSE, CAPILLARY: Glucose-Capillary: 86 mg/dL (ref 70–99)

## 2018-08-03 MED ORDER — GUAIFENESIN-DM 100-10 MG/5ML PO SYRP
5.0000 mL | ORAL_SOLUTION | ORAL | Status: DC | PRN
  Administered 2018-08-03 – 2018-08-04 (×2): 5 mL via ORAL
  Filled 2018-08-03 (×3): qty 5

## 2018-08-03 MED ORDER — ACETAMINOPHEN 325 MG PO TABS
650.0000 mg | ORAL_TABLET | Freq: Four times a day (QID) | ORAL | Status: DC | PRN
  Administered 2018-08-03 – 2018-08-04 (×3): 650 mg via ORAL
  Filled 2018-08-03 (×3): qty 2

## 2018-08-03 MED ORDER — SODIUM CHLORIDE 0.9 % IV BOLUS
1000.0000 mL | Freq: Once | INTRAVENOUS | Status: AC
  Administered 2018-08-03: 1000 mL via INTRAVENOUS

## 2018-08-03 MED ORDER — POTASSIUM CHLORIDE CRYS ER 20 MEQ PO TBCR
40.0000 meq | EXTENDED_RELEASE_TABLET | Freq: Two times a day (BID) | ORAL | Status: AC
  Administered 2018-08-03: 40 meq via ORAL
  Filled 2018-08-03: qty 2

## 2018-08-03 MED ORDER — CHLORHEXIDINE GLUCONATE CLOTH 2 % EX PADS
6.0000 | MEDICATED_PAD | Freq: Every day | CUTANEOUS | Status: DC
  Administered 2018-08-03 – 2018-08-14 (×12): 6 via TOPICAL

## 2018-08-03 NOTE — Progress Notes (Signed)
Patient ID: Alicia Riley, female   DOB: 1981/02/10, 38 y.o.   MRN: [id number] Patient will be on fetal monitor for NST BID. Transfer to progressive care was ordered.  Adam Phenix, MD [phone]

## 2018-08-03 NOTE — Progress Notes (Signed)
Family Medicine Teaching Service Daily Progress Note Intern Pager: [phone]  Patient name: Alicia Riley Medical record number: [id number] Date of birth: Oct 19, 1980 Age: 38 y.o. Gender: female  Primary Care Provider: Patient, No Pcp Per Consultants: OB Code Status: Full Code  Preferred Emergency Contact: husband "Om-lah" [phone]  Pt Overview and Major Events to Date:  Hospital Day: 2 08/02/2018: admitted for covid  Assessment and Plan: Alicia Riley is aG63P2 38 y.o. female at 30+2 who presented w/ COVID-19 [facility]  has a past medical history of Medical history non-contributory.  # Sepsis  # COVID - 19 On admission, patient was febrile to 102.1, hypotensive to 85/59, tachycardic to100-110's, but satting well on room air.  Overnight her tachycardia improved with maintenance fluids.  Her blood pressure continues to be low with maps in the high 50s-60.  Her fever has improved with Tylenol.  She continues to sat well on room air.  Patient does not have leukocytosis this morning and labs are unremarkable except for potassium of 3.1. Patient feeling okay with continued cough, making it difficult to sleep. She has SOB with coughing only. Patient aware to watch for sharp chest pain, increased respiratory rate, worsened difficulty breathing, decreased fetal movement, vaginal discharge/bleeding/fluid loss and alert the nurse if any of this happens.   CCM aware, if MAP  Continue IV fluids at 150 mL/h.  Tylenol as needed for fever  Continuous pulse ox and telemetry  C/S ID   Call husband   Please call MD if patient requires oxygen  # COVID positive contacts  Patient reports that her husband tested positive for COVID and has continued to go to work.  She also has 2 other children at home who are not experiencing any symptoms. Spoke to husband over the phone and he reports still going to work at [employer], last on Friday and has been going all week.   Call health dept on Monday morning  to set up COVID testing for children and husband.  # Chest pain Patient reported sharp chest pain.  Given known increased risk of clots in the setting of coded, considered CTA overnight, however, did not obtain due to pregnancy and reassuring oxygen saturations.  This morning, patient reports some chest pain with cough.  CTA if worsening tachypnea and oxygen requirement  # Hypokalemia 3.1 this morning.   40 mEq x 2 today   # G4P2 @ 30+3  FHR reassuring in ED. Follows at [facility] hospital, but has not had ROB since 21 weeks. On 3/11, 4 week growth [place] recommended  OB following   Will need growth ultrasound 2 weeks after discharge   # FEN/GI:   Fluids: 129mL/hr NS  . Electrolytes: 40 K x 2   . Nutrition: Regular Diet  Access: R PIV  VTE prophylaxis: Lovenox 40 (CrCl>30)  Disposition: Home, pending respiratory status   Subjective:  NAEO.   Objective: Temp:  [99.4 F (37.4 C)-102.1 F (38.9 C)] 99.5 F (37.5 C) (05/10 0009) Pulse Rate:  [83-119] 83 (05/10 0700) Cardiac Rhythm: Normal sinus rhythm (05/09 2330) Resp:  [13-29] 23 (05/10 0700) BP: (77-111)/(43-68) 81/49 (05/10 0700) SpO2:  [94 %-100 %] 96 % (05/10 0700) Weight:  [65.1 kg] 65.1 kg (05/10 0006) 05/09 0701 - 05/10 0700 In: 1050 [I.V.:1050] Out: -    Physical Exam: See attending attestation.  Laboratory: I have personally read and reviewed all labs and imaging studies.  CBC: Recent Labs  Lab 08/02/18 1818 08/03/18 0016  WBC 6.6 6.4  NEUTROABS 5.2 5.1  HGB 9.6* 9.4*  HCT 29.9* 28.1*  MCV 92.9 89.5  PLT 132* 120*   CMP: Recent Labs  Lab 08/02/18 1818 08/03/18 0016  NA 131* 135  K 2.8* 3.1*  CL 101 107  CO2 19* 18*  GLUCOSE 96 95  BUN 7 <5*  CREATININE 0.50 0.45  CALCIUM 7.8* 7.9*  ALBUMIN 2.9* 2.6*     CBG: Recent Labs  Lab 08/03/18 0013  GLUCAP 86   Imaging/Diagnostic Tests: Dg Chest Portable 1 View  Result Date: 08/02/2018 CLINICAL DATA:  Fever and cough with positive COVID-19  test EXAM: PORTABLE CHEST 1 VIEW COMPARISON:  10/20/2013 FINDINGS: Cardiac shadows within normal limits. The lungs are hypoinflated with bibasilar opacities. No sizable effusion is seen. No bony abnormality is noted. IMPRESSION: Bibasilar opacities and poor inspiratory effort. Electronically Signed   By: Alcide CleverMark  Lukens M.D.   On: 08/02/2018 19:03    EKG Interpretation  Date/Time:  Saturday Aug 02 2018 17:43:45 EDT Ventricular Rate:  116 PR Interval:    QRS Duration: 111 QT Interval:  325 QTC Calculation: 452 R Axis:   62 Text Interpretation:  Sinus tachycardia Consider left atrial enlargement RSR' in V1 or V2, right VCD or RVH Confirmed by Kennis CarinaBero, Michael [id number]) on 08/02/2018 6:06:35 PM        Melene PlanKim, Donnae Michels E, MD 08/03/2018, 7:56 AM PGY-1, [facility] FPTS Intern pager: [phone], text pages welcome

## 2018-08-03 NOTE — Progress Notes (Signed)
RROB performing NST.  FHR baseline 135, 15x15 accels noted, moderate variability, and no decels.  No ucs traced.  MD will be notified of reactive NST.

## 2018-08-03 NOTE — Progress Notes (Signed)
Pt. NST reactive. Attending Dr. Alysia Penna made aware at this time. Pt. Denies ctx and vaginal bleeding. Pt. Endorses good fetal movement.

## 2018-08-04 DIAGNOSIS — Z789 Other specified health status: Secondary | ICD-10-CM

## 2018-08-04 LAB — BASIC METABOLIC PANEL
Anion gap: 7 (ref 5–15)
BUN: <5 mg/dL — ABNORMAL LOW (ref 6–20)
CO2: 17 mmol/L — ABNORMAL LOW (ref 22–32)
Calcium: 7.5 mg/dL — ABNORMAL LOW (ref 8.9–10.3)
Chloride: 111 mmol/L (ref 98–111)
Creatinine, Ser: 0.45 mg/dL (ref 0.44–1.00)
GFR calc Af Amer: >60 mL/min (ref >60)
GFR calc non Af Amer: >60 mL/min (ref >60)
Glucose, Bld: 128 mg/dL — ABNORMAL HIGH (ref 70–99)
Potassium: 2.6 mmol/L — CL (ref 3.5–5.1)
Sodium: 135 mmol/L (ref 135–145)

## 2018-08-04 LAB — CBC
HCT: 25.3 % — ABNORMAL LOW (ref 36.0–46.0)
Hemoglobin: 8.3 g/dL — ABNORMAL LOW (ref 12.0–15.0)
MCH: 29.9 pg (ref 26.0–34.0)
MCHC: 32.8 g/dL (ref 30.0–36.0)
MCV: 91 fL (ref 80.0–100.0)
Platelets: 107 10*3/uL — ABNORMAL LOW (ref 150–400)
RBC: 2.78 MIL/uL — ABNORMAL LOW (ref 3.87–5.11)
RDW: 14.1 % (ref 11.5–15.5)
WBC: 5.3 10*3/uL (ref 4.0–10.5)
nRBC: 0 % (ref 0.0–0.2)

## 2018-08-04 LAB — MRSA PCR SCREENING: MRSA by PCR: NEGATIVE

## 2018-08-04 LAB — MAGNESIUM: Magnesium: 1.6 mg/dL — ABNORMAL LOW (ref 1.7–2.4)

## 2018-08-04 LAB — POTASSIUM: Potassium: 3.5 mmol/L (ref 3.5–5.1)

## 2018-08-04 MED ORDER — POTASSIUM CHLORIDE CRYS ER 20 MEQ PO TBCR: 40.0000 meq | EXTENDED_RELEASE_TABLET | Freq: Two times a day (BID) | ORAL | Status: DC

## 2018-08-04 MED ORDER — ACETAMINOPHEN 500 MG PO TABS
1000.0000 mg | ORAL_TABLET | Freq: Four times a day (QID) | ORAL | Status: DC | PRN
  Administered 2018-08-04 – 2018-08-05 (×2): 1000 mg via ORAL
  Filled 2018-08-04 (×2): qty 2

## 2018-08-04 MED ORDER — MAGNESIUM SULFATE 2 GM/50ML IV SOLN
2.0000 g | Freq: Once | INTRAVENOUS | Status: AC
  Administered 2018-08-04: 2 g via INTRAVENOUS
  Filled 2018-08-04: qty 50

## 2018-08-04 MED ORDER — GUAIFENESIN-DM 100-10 MG/5ML PO SYRP
5.0000 mL | ORAL_SOLUTION | Freq: Four times a day (QID) | ORAL | Status: DC
  Administered 2018-08-04 (×2): 5 mL via ORAL
  Filled 2018-08-04 (×2): qty 5

## 2018-08-04 MED ORDER — MUSCLE RUB 10-15 % EX CREA
TOPICAL_CREAM | CUTANEOUS | Status: DC | PRN
  Administered 2018-08-09: 1 via TOPICAL
  Filled 2018-08-04 (×2): qty 85

## 2018-08-04 MED ORDER — POTASSIUM CHLORIDE CRYS ER 20 MEQ PO TBCR
40.0000 meq | EXTENDED_RELEASE_TABLET | ORAL | Status: AC
  Administered 2018-08-04 (×3): 40 meq via ORAL
  Filled 2018-08-04 (×3): qty 2

## 2018-08-04 NOTE — Progress Notes (Signed)
Dr. Vergie Living notified that pt's platelets were 120 yesterday and today they are 107. There is a CBC with diff ordered for tomorrow morning. He says that Dr. Earlene Plater is rounding on the pt today and will review her chart, but he will let her know about the pt's platelet count.

## 2018-08-04 NOTE — Progress Notes (Addendum)
FHR baseline 135 BPM, moderate variability, accels, no decels. No uc's. No c/o abd cramping or discomfort. Denies vaginal bleeding or leaking of fluid. Dr.Pickens notified.

## 2018-08-04 NOTE — Progress Notes (Signed)
Spoke with the health department regarding COVID testing for patient's husband since he has had exposure to her and has had frequent coughing for the past week.  Also relayed to them that he speaks Burmese and will need an interpreter to answer their questions.  They said that they can call him with an interpreter on the line and will do so today.  Carr Shartzer C. Frances Furbish, MD PGY-2, [facility] 08/04/2018 4:15 PM

## 2018-08-04 NOTE — Progress Notes (Addendum)
Family Medicine Teaching Service Daily Progress Note Intern Pager: [phone]  Patient name: Alicia Riley Alicia Riley Medical record number: [id number] Date of birth: [birth year] Age: 38 y.o. Gender: female  Primary Care Provider: Patient, No Pcp Per Consultants: OB Code Status: Full Code  Preferred Emergency Contact: husband "Om-lah" [phone]  Pt Overview and Major Events to Date:  Hospital Day: 3 08/02/2018: admitted for covid  Assessment and Plan: Alicia Riley Tametha is 60aG4P2 38 y.o. female at 930+2 who presented w/ COVID-19. No pmhx.   # Sepsis  # COVID - 19 On admission, patient was febrile to 102.1, hypotensive to 85/59, tachycardic to100-110's, but continues to sat well on room air.  Overnight, patient continued to be afebrile with heart rates in the 90s.  Her maps dropped close to 54 overnight while sleeping.  She continues to sat well on room air.  Patient continues to have cough causing some chest pain that was relieved with Tylenol yesterday evening.  Given known risk of hypercoagulability in the setting of COVID 19, low threshold to obtain CTA if patient has pleuritic chest pain with tachypnea and new O2 requirement.  Patient aware of any red flag signs that she should notify her RN for immediately. Goals for discharge include Improved fluid PO, stable MAP, continues to have good respiratory status.   Monitor maps  Continue IV fluids at 150 mL/hr  If map <55 2 measurements, attempt fluid bolus, low threshold to alert CCM  Consider consulting with ID  Please call MD if patient requires oxygen.  Determine if patient is eating.   DC IV fluids when patient is taking in more PO   CTA if worsening tachypnea and oxygen requirement  Mucinex DM scheduled q 6 hours in setting of language barrier.  Tylenol 1000 mg, bengay, k pad for rib pain  Strict IO  #COVID Contacts  Patient's husband no tested for COVID, but patient reports husband has been coughing through the last week. He denies fever.    [facility]   Arrange for husband to get COVID testing.   # Hypokalemia 2.6 this morning.   40 mEq x 3 today   2g Mg IVPB   K+ follow up 1600  # G4P2 @ 30+4 FHR reassuring in ED. [facility], but has not had ROB since 21 weeks. On 3/11, 4 week growth US recommended.   OB following   NST twice daily  Will need growth ultrasound 2 weeks after discharge   # FEN/GI:   Fluids: 13450mL/hr NS  . Electrolytes: hypokalemia, KDUR 40 x 3  . Nutrition: Regular Diet  Access: R PIV  VTE prophylaxis: Lovenox 40 (CrCl>30)  Disposition: Home, pending respiratory status   Subjective:  NAEO.   Objective: Temp:  [98.3 F (36.8 C)-99.3 F (37.4 C)] 98.6 F (37 C) (05/11 0500) Pulse Rate:  [83-103] 103 (05/11 0500) Cardiac Rhythm: Sinus tachycardia (05/10 1956) Resp:  [19-29] 22 (05/11 0500) BP: (75-102)/(44-65) 86/57 (05/11 0500) SpO2:  [94 %-100 %] 94 % (05/11 0500) 05/10 0701 - 05/11 0700 In: 4138.4 [P.O.:840; I.V.:3298.4] Out: 2050 [Urine:2050]   Physical Exam: See attending attestation.  Laboratory: I have personally read and reviewed all labs and imaging studies.  CBC: Recent Labs  Lab 08/02/18 1818 08/03/18 0016  WBC 6.6 6.4  NEUTROABS 5.2 5.1  HGB 9.6* 9.4*  HCT 29.9* 28.1*  MCV 92.9 89.5  PLT 132* 120*   CMP: Recent Labs  Lab 08/02/18 1818 08/03/18 0016  NA 131* 135  K 2.8* 3.1*  CL 101 107  CO2 19* 18*  GLUCOSE 96 95  BUN 7 <5*  CREATININE 0.50 0.45  CALCIUM 7.8* 7.9*  ALBUMIN 2.9* 2.6*     CBG: Recent Labs  Lab 08/03/18 0013  GLUCAP 86   Imaging/Diagnostic Tests: Dg Chest Portable 1 View  Result Date: 08/02/2018 CLINICAL DATA:  Fever and cough with positive COVID-19 test EXAM: PORTABLE CHEST 1 VIEW COMPARISON:  10/20/2013 FINDINGS: Cardiac shadows within normal limits. The lungs are hypoinflated with bibasilar opacities. No sizable effusion is seen. No bony abnormality  is noted. IMPRESSION: Bibasilar opacities and poor inspiratory effort. Electronically Signed   By: Alcide Clever M.D.   On: 08/02/2018 19:03    EKG Interpretation  Date/Time:  Saturday Aug 02 2018 17:43:45 EDT Ventricular Rate:  116 PR Interval:    QRS Duration: 111 QT Interval:  325 QTC Calculation: 452 R Axis:   62 Text Interpretation:  Sinus tachycardia Consider left atrial enlargement RSR' in V1 or V2, right VCD or RVH Confirmed by Kennis Carina [phone]) on 08/02/2018 6:06:35 PM        Melene Plan, MD 08/04/2018, 6:00 AM PGY-1, [facility] Family Medicine FPTS Intern pager: [phone], text pages welcome

## 2018-08-05 ENCOUNTER — Inpatient Hospital Stay (HOSPITAL_COMMUNITY): Payer: Medicaid Other

## 2018-08-05 ENCOUNTER — Encounter (HOSPITAL_COMMUNITY): Payer: Self-pay | Admitting: *Deleted

## 2018-08-05 DIAGNOSIS — J9621 Acute and chronic respiratory failure with hypoxia: Secondary | ICD-10-CM

## 2018-08-05 DIAGNOSIS — O0933 Supervision of pregnancy with insufficient antenatal care, third trimester: Secondary | ICD-10-CM

## 2018-08-05 LAB — HEPATIC FUNCTION PANEL
ALT: 17 U/L (ref 0–44)
AST: 25 U/L (ref 15–41)
Albumin: 2.5 g/dL — ABNORMAL LOW (ref 3.5–5.0)
Alkaline Phosphatase: 71 U/L (ref 38–126)
Bilirubin, Direct: <0.1 mg/dL (ref 0.0–0.2)
Total Bilirubin: 0.5 mg/dL (ref 0.3–1.2)
Total Protein: 5.7 g/dL — ABNORMAL LOW (ref 6.5–8.1)

## 2018-08-05 LAB — CBC WITH DIFFERENTIAL/PLATELET
Abs Immature Granulocytes: 0.06 10*3/uL (ref 0.00–0.07)
Basophils Absolute: 0 10*3/uL (ref 0.0–0.1)
Basophils Relative: 0 %
Eosinophils Absolute: 0 10*3/uL (ref 0.0–0.5)
Eosinophils Relative: 0 %
HCT: 26.5 % — ABNORMAL LOW (ref 36.0–46.0)
Hemoglobin: 8.9 g/dL — ABNORMAL LOW (ref 12.0–15.0)
Immature Granulocytes: 1 %
Lymphocytes Relative: 14 %
Lymphs Abs: 0.8 10*3/uL (ref 0.7–4.0)
MCH: 30.2 pg (ref 26.0–34.0)
MCHC: 33.6 g/dL (ref 30.0–36.0)
MCV: 89.8 fL (ref 80.0–100.0)
Monocytes Absolute: 0.2 10*3/uL (ref 0.1–1.0)
Monocytes Relative: 4 %
Neutro Abs: 4.4 10*3/uL (ref 1.7–7.7)
Neutrophils Relative %: 81 %
Platelets: 110 10*3/uL — ABNORMAL LOW (ref 150–400)
RBC: 2.95 MIL/uL — ABNORMAL LOW (ref 3.87–5.11)
RDW: 14.2 % (ref 11.5–15.5)
WBC: 5.4 10*3/uL (ref 4.0–10.5)
nRBC: 0 % (ref 0.0–0.2)

## 2018-08-05 LAB — MAGNESIUM: Magnesium: 1.7 mg/dL (ref 1.7–2.4)

## 2018-08-05 LAB — BASIC METABOLIC PANEL
Anion gap: 9 (ref 5–15)
BUN: <5 mg/dL — ABNORMAL LOW (ref 6–20)
CO2: 16 mmol/L — ABNORMAL LOW (ref 22–32)
Calcium: 7.8 mg/dL — ABNORMAL LOW (ref 8.9–10.3)
Chloride: 111 mmol/L (ref 98–111)
Creatinine, Ser: 0.45 mg/dL (ref 0.44–1.00)
GFR calc Af Amer: >60 mL/min (ref >60)
GFR calc non Af Amer: >60 mL/min (ref >60)
Glucose, Bld: 84 mg/dL (ref 70–99)
Potassium: 3.5 mmol/L (ref 3.5–5.1)
Sodium: 136 mmol/L (ref 135–145)

## 2018-08-05 LAB — BLOOD GAS, ARTERIAL
Acid-base deficit: 7 mmol/L — ABNORMAL HIGH (ref 0.0–2.0)
Bicarbonate: 16.8 mmol/L — ABNORMAL LOW (ref 20.0–28.0)
Drawn by: 246101
O2 Content: 2 L/min
O2 Saturation: 97.7 %
Patient temperature: 99.9
pCO2 arterial: 27.9 mmHg — ABNORMAL LOW (ref 32.0–48.0)
pH, Arterial: 7.4 (ref 7.350–7.450)
pO2, Arterial: 109 mmHg — ABNORMAL HIGH (ref 83.0–108.0)

## 2018-08-05 LAB — LACTIC ACID, PLASMA: Lactic Acid, Venous: 0.7 mmol/L (ref 0.5–1.9)

## 2018-08-05 LAB — C-REACTIVE PROTEIN: CRP: 11.8 mg/dL — ABNORMAL HIGH (ref <1.0)

## 2018-08-05 LAB — D-DIMER, QUANTITATIVE: D-Dimer, Quant: 1.48 ug/mL-FEU — ABNORMAL HIGH (ref 0.00–0.50)

## 2018-08-05 LAB — PROCALCITONIN: Procalcitonin: 0.2 ng/mL

## 2018-08-05 LAB — TROPONIN I: Troponin I: <0.03 ng/mL (ref <0.03)

## 2018-08-05 LAB — LACTATE DEHYDROGENASE: LDH: 171 U/L (ref 98–192)

## 2018-08-05 LAB — FERRITIN: Ferritin: 40 ng/mL (ref 11–307)

## 2018-08-05 MED ORDER — ACETAMINOPHEN 325 MG PO TABS
650.0000 mg | ORAL_TABLET | Freq: Three times a day (TID) | ORAL | Status: DC
  Administered 2018-08-05 – 2018-08-06 (×3): 650 mg via ORAL
  Filled 2018-08-05 (×3): qty 2

## 2018-08-05 MED ORDER — IPRATROPIUM BROMIDE HFA 17 MCG/ACT IN AERS
2.0000 | INHALATION_SPRAY | RESPIRATORY_TRACT | Status: DC
  Filled 2018-08-05: qty 12.9

## 2018-08-05 MED ORDER — MIDAZOLAM HCL 2 MG/2ML IJ SOLN
INTRAMUSCULAR | Status: AC
  Filled 2018-08-05: qty 2

## 2018-08-05 MED ORDER — MAGNESIUM SULFATE IN D5W 1-5 GM/100ML-% IV SOLN
1.0000 g | Freq: Once | INTRAVENOUS | Status: AC
  Administered 2018-08-05: 1 g via INTRAVENOUS
  Filled 2018-08-05: qty 100

## 2018-08-05 MED ORDER — FENTANYL CITRATE (PF) 100 MCG/2ML IJ SOLN
INTRAMUSCULAR | Status: AC
  Filled 2018-08-05: qty 2

## 2018-08-05 MED ORDER — IPRATROPIUM BROMIDE HFA 17 MCG/ACT IN AERS: 2.0000 | INHALATION_SPRAY | RESPIRATORY_TRACT | Status: DC

## 2018-08-05 MED ORDER — IPRATROPIUM-ALBUTEROL 20-100 MCG/ACT IN AERS: 1.0000 | INHALATION_SPRAY | Freq: Four times a day (QID) | RESPIRATORY_TRACT | Status: DC

## 2018-08-05 MED ORDER — ACETAMINOPHEN-CODEINE #3 300-30 MG PO TABS
1.0000 | ORAL_TABLET | ORAL | Status: DC | PRN
  Administered 2018-08-05: 1 via ORAL
  Filled 2018-08-05: qty 1

## 2018-08-05 MED ORDER — GUAIFENESIN-DM 100-10 MG/5ML PO SYRP
5.0000 mL | ORAL_SOLUTION | ORAL | Status: DC | PRN
  Administered 2018-08-05 – 2018-08-12 (×21): 5 mL via ORAL
  Filled 2018-08-05 (×22): qty 5

## 2018-08-05 MED ORDER — ALBUTEROL SULFATE HFA 108 (90 BASE) MCG/ACT IN AERS
1.0000 | INHALATION_SPRAY | Freq: Four times a day (QID) | RESPIRATORY_TRACT | Status: DC
  Filled 2018-08-05: qty 6.7

## 2018-08-05 MED ORDER — IPRATROPIUM-ALBUTEROL 20-100 MCG/ACT IN AERS
1.0000 | INHALATION_SPRAY | Freq: Four times a day (QID) | RESPIRATORY_TRACT | Status: DC
  Administered 2018-08-05 – 2018-08-06 (×3): 1 via RESPIRATORY_TRACT
  Filled 2018-08-05: qty 4

## 2018-08-05 MED ORDER — POTASSIUM CHLORIDE CRYS ER 20 MEQ PO TBCR
40.0000 meq | EXTENDED_RELEASE_TABLET | Freq: Once | ORAL | Status: AC
  Administered 2018-08-05: 40 meq via ORAL
  Filled 2018-08-05: qty 2

## 2018-08-05 NOTE — Progress Notes (Signed)
Lunch tray provided, pt states she will eat shortly. Pt has been resting, slight tachypnea as she is resting in bed, Dr Selena Batten aware of resp and pts condition. BP maintaining MAP ranging mid 60's to low 70"s. Pt received relief with Pain med earlier. Plan to continue to monitor and notify MD if changes occur.

## 2018-08-05 NOTE — Progress Notes (Addendum)
Family Medicine Teaching Service Daily Progress Note Intern Pager: [phone]  Patient name: Alicia Riley Medical record number: [id number] Date of birth: 1981-01-04 Age: 38 y.o. Gender: female  Primary Care Provider: Patient, No Pcp Per Consultants: OB Code Status: Full Code  Preferred Emergency Contact: husband "Om-lah" [phone]  Pt Overview and Major Events to Date:  Hospital Day: 4 08/02/2018: admitted for covid  Assessment and Plan: Alicia Riley is aG67P2 38 y.o. female at 21+2 who presented w/ COVID-19. No pmhx.   # COVID - 19, Day 5 of illness  # WATCHER At 0300, patient with fever of 101.2.  She has been tachypneic overnight with RR 23-38.  HR  90-115.  Maps 60s and low 70s overnight.  Most recent is 60.  Patient has started to appear hypoxic with sats 92-93 this morning.  She continues to have poor appetite eating 50% of most of her meals, and remains on 150 cc/h normal saline.  Overall, patient's vital signs and presentation are consistent with more severe disease and I would expect patient to have worsening symptoms over the next few days, has been seen in more severe cases of this disease.  We will continue to monitor this patient closely.  Should patient require oxygen, there will be a very low threshold to alert CCM and OP, as this patient is high risk.  Yesterday, discussed goals of discharge with improved fluid p.o., stable map and respiratory status.  Patient will likely not be a candidate for discharge in the next couple of days given natural history of disease. Increased cough meds overnight to q4 hours   Monitor MAP  Q2 hour vitals  Continue IV fluids at 150 mL/hr  If map <55 2 measurements, attempt fluid bolus, low threshold to alert CCM  Consider consulting with ID  Please call MD if patient requires oxygen  DC IV fluids when patient is taking in more PO   Mucinex DM scheduled q 4 hours in setting of language barrier.  Tylenol 1000 mg, bengay, k pad for rib  pain  Strict IO  # Thrombocytopenia Unclear etiology or if this is baseline for patient.   Continue to monitor  #Polychromasia on Diff With low platelets, would be concerned for HELLLP, though CMP normal on admit.   Continue to monitor  # Hypokalemia #Hypomagnesemia K 3.5, Mg 1.7 this morning.   40 mEq x 1 today   1 Mg IVPB   # G4P2 @ 30+5 Stable NST BID.   OB following   NST twice daily  Will need growth ultrasound 2 weeks after discharge   # FEN/GI:   Fluids: 133mL/hr NS  . Electrolytes: KDUR 40 x1, Mg 1g  . Nutrition: Regular Diet  Access: R PIV (08/02/18) VTE prophylaxis: Lovenox 40 (CrCl>30)  Disposition: Home, pending respiratory status   Subjective:  Feels okay. Not SOB unless she is coughing.   Objective: Temp:  [98.3 F (36.8 C)-101.2 F (38.4 C)] 101.2 F (38.4 C) (05/12 0348) Pulse Rate:  [89-115] 115 (05/12 0356) Cardiac Rhythm: Sinus tachycardia (05/12 0040) Resp:  [24-31] 24 (05/12 0348) BP: (79-101)/(48-62) 101/62 (05/12 0356) SpO2:  [92 %-100 %] 92 % (05/12 0356) 05/11 0701 - 05/12 0700 In: 3448.3 [P.O.:2230; I.V.:1168.3; IV Piggyback:50] Out: 5050 [Urine:5050]   Physical Exam: See attending attestation.  Laboratory: I have personally read and reviewed all labs and imaging studies.  CBC: Recent Labs  Lab 08/02/18 1818 08/03/18 0016 08/04/18 0722  WBC 6.6 6.4 5.3  NEUTROABS 5.2 5.1  --  HGB 9.6* 9.4* 8.3*  HCT 29.9* 28.1* 25.3*  MCV 92.9 89.5 91.0  PLT 132* 120* 107*   CMP: Recent Labs  Lab 08/02/18 1818 08/03/18 0016 08/04/18 0722 08/04/18 1427  NA 131* 135 135  --   K 2.8* 3.1* 2.6* 3.5  CL 101 107 111  --   CO2 19* 18* 17*  --   GLUCOSE 96 95 128*  --   BUN 7 <5* <5*  --   CREATININE 0.50 0.45 0.45  --   CALCIUM 7.8* 7.9* 7.5*  --   MG  --   --  1.6*  --   ALBUMIN 2.9* 2.6*  --   --      Imaging/Diagnostic Tests: No results found.   Melene PlanKim, Weyman Bogdon E, MD 08/05/2018, 5:43 AM PGY-1, [facility] [facility]  [facility] FPTS Intern pager: [phone], text pages welcome

## 2018-08-05 NOTE — Progress Notes (Signed)
Dr. Jolayne Panther updated on NST.  She is aware of plan to transfer and intubate patient.  Requests that NST is repeated once patient is intubated.  Delivery will only be expedited if indicated by maternal status per Dr. Jolayne Panther.

## 2018-08-05 NOTE — Consult Note (Signed)
NAMTamela Riley:  Alicia Riley, MRN:  [id number], DOB:  08-25-1980, LOS: 3 ADMISSION DATE:  08/02/2018, CONSULTATION DATE:  08/05/18 REFERRING MD:  Dr. Jennette KettleNeal , CHIEF COMPLAINT:  COVID-19 infection  Brief History   38 year old female who is 30 +[redacted] weeks pregnant admitted with respiratory symptoms and was found to be positive for Novel Coronavirus. She began to decompensate requiring supplemental oxygen and PCCM was consulted.   History of present illness   38 year old [place] female (does not speak [place]) who is G4P2 and actively pregnant at [redacted] weeks and 2 days at time of presentation. [facility] with complaints of SOB, cough, and fever. Symptoms started 5/8. She did not require supplemental oxygen, but she was initially hypotensive, responded well to IVF resuscitation. Then overnight 5/11 >5/12 she developed worsening tachypnea and tachycardia. Fetal heart tones also elevated to 180 (expected 140). Throughout the day she became diaphoretic and hypoxic requiring up to 3L of supplemental oxygen via nasal cannula. For this reason PCCM was asked to see in consultation.   Past Medical History  G4P2  Significant Hospital Events   5/9 admit 5/12 transfer to ICu for worsening hypoxia.   Consults:  OB  Procedures:    Significant Diagnostic Tests:    Micro Data:  5/9 SARS Coronavirus 2 > positive 5/12 Blood culture >>>  Antimicrobials:  Azithromycin 5/12 > Ceftraixone 5/12 >  Interim history/subjective:  Currently describes dyspnea on 3 L [place]. Non productive cough painful sides with cough.   Objective   Blood pressure 99/65, pulse (!) 115, temperature 99.9 F (37.7 C), temperature source Oral, resp. rate (!) 30, height 5\' 3"  (1.6 m), weight 65.1 kg, last menstrual period 01/02/2018, SpO2 92 %, unknown if currently breastfeeding.        Intake/Output Summary (Last 24 hours) at 08/05/2018 1717 Last data filed at 08/05/2018 1600 Gross per 24 hour  Intake 4237.65 ml  Output  7050 ml  Net -2812.35 ml   Filed Weights   08/03/18 0006  Weight: 65.1 kg    Examination: General: young adult female in mild distress HENT: [place]/AT, PERRL, no JVD Lungs: Diminished Cardiovascular: Mild tachy, regular Abdomen: Distended abdomen, firm. Pregnant  Extremities: No acute deformity, ROM limitation, or edema Neuro: Alert, oriented, non-focal  Resolved Hospital Problem list     Assessment & Plan:   COVID-19 infection: - Transfer to ICU - Supplemental O2 to keep SpO2 > 92% - Scheduled albuterol and atrovent MDI - May benefit from diuresis but caution with low BP and pregnancy - Check CRP, D-dimer, ferritin, LDH, Troponin, Blood Cx - CTX, azithro, and check PCT - May be candidate for convalescent plasma, see below discussion.  - Position change as tolerated, likely unable to prone.   Sepsis: secondary to above - Continue maintenance fluid x 4 hours then discontinue.   Active pregnancy: - OB following  She may be a candidate for convalescent plasma transfusion, however, language barrier will complicate this. Have discussed with Dr. Marchelle Gearingamaswamy who has conferred with [facility]. Seeing as there is no [place]ese consent form, we could use the english form if it was read verbatim to patient/husband via interpreter.  Best practice:  Diet: Regular  Pain/Anxiety/Delirium protocol (if indicated): N/a VAP protocol (if indicated): N/a DVT prophylaxis: lovenox pending d-dimer GI prophylaxis: n/a Glucose control: n/a Mobility: BR Code Status: FULL Family Communication: Patient updated. Husband updated by FPTS Disposition: ICU  Labs   CBC: Recent Labs  Lab  08/02/18 1818 08/03/18 0016 08/04/18 0722 08/05/18 0638  WBC 6.6 6.4 5.3 5.4  NEUTROABS 5.2 5.1  --  4.4  HGB 9.6* 9.4* 8.3* 8.9*  HCT 29.9* 28.1* 25.3* 26.5*  MCV 92.9 89.5 91.0 89.8  PLT 132* 120* 107* 110*    Basic Metabolic Panel: Recent Labs  Lab 08/02/18 1818 08/03/18 0016 08/04/18 0722 08/04/18  1427 08/05/18 0638  NA 131* 135 135  --  136  K 2.8* 3.1* 2.6* 3.5 3.5  CL 101 107 111  --  111  CO2 19* 18* 17*  --  16*  GLUCOSE 96 95 128*  --  84  BUN 7 <5* <5*  --  <5*  CREATININE 0.50 0.45 0.45  --  0.45  CALCIUM 7.8* 7.9* 7.5*  --  7.8*  MG  --   --  1.6*  --  1.7   GFR: Estimated Creatinine Clearance: 87.4 mL/min (by C-G formula based on SCr of 0.45 mg/dL). Recent Labs  Lab 08/02/18 1818 08/02/18 2126 08/03/18 0016 08/04/18 0722 08/05/18 0638  WBC 6.6  --  6.4 5.3 5.4  LATICACIDVEN  --  0.8  --   --   --     Liver Function Tests: Recent Labs  Lab 08/02/18 1818 08/03/18 0016 08/05/18 0638  AST 25 25 25   ALT 21 18 17   ALKPHOS 70 63 71  BILITOT 0.4 0.6 0.5  PROT 6.2* 5.7* 5.7*  ALBUMIN 2.9* 2.6* 2.5*   No results for input(s): LIPASE, AMYLASE in the last 168 hours. No results for input(s): AMMONIA in the last 168 hours.  ABG No results found for: PHART, PCO2ART, PO2ART, HCO3, TCO2, ACIDBASEDEF, O2SAT   Coagulation Profile: No results for input(s): INR, PROTIME in the last 168 hours.  Cardiac Enzymes: No results for input(s): CKTOTAL, CKMB, CKMBINDEX, TROPONINI in the last 168 hours.  HbA1C: No results found for: HGBA1C  CBG: Recent Labs  Lab 08/03/18 0013  GLUCAP 86    Review of Systems:   Bolds are positive  Constitutional: weight loss, gain, night sweats, Fevers, chills, fatigue .  HEENT: headaches, Sore throat, sneezing, nasal congestion, post nasal drip, Difficulty swallowing, Tooth/dental problems, visual complaints visual changes, ear ache CV:  chest pain, radiates:,Orthopnea, PND, swelling in lower extremities, dizziness, palpitations, syncope.  GI  heartburn, indigestion, abdominal pain, nausea, vomiting, diarrhea, change in bowel habits, loss of appetite, bloody stools.  Resp: cough, non-productive, hemoptysis, dyspnea, chest pain, pleuritic.  Skin: rash or itching or icterus GU: dysuria, change in color of urine, urgency or  frequency. flank pain, hematuria  MS: joint pain or swelling. decreased range of motion  Psych: change in mood or affect. depression or anxiety.  Neuro: difficulty with speech, weakness, numbness, ataxia    Past Medical History  She,  has a past medical history of Medical history non-contributory.   Surgical History    Past Surgical History:  Procedure Laterality Date  . DILATION AND EVACUATION N/A 08/03/2015   Procedure: DILATATION AND EVACUATION;  Surgeon: [name], MD;  Location: [facility];  Service: Gynecology;  Laterality: N/A;  . NO PAST SURGERIES       Social History   reports that she has never smoked. She has never used smokeless tobacco. She reports that she does not drink alcohol or use drugs.   Family History   Her family history is not on file.   Allergies No Known Allergies   Home Medications  Prior to Admission medications   Medication Sig  Start Date End Date Taking? Authorizing Provider  Prenatal Vit-Fe Fumarate-FA (PRENATAL MULTIVITAMIN) TABS tablet Take 1 tablet by mouth daily.    Yes [provider]     Critical care time:      Joneen Roach, AGACNP-BC [facility] Pulmonary/Critical Care Pager [phone] or [phone]  08/05/2018 7:01 PM

## 2018-08-05 NOTE — Progress Notes (Addendum)
Went to check in on the patient with the attending for physical exam.  Patient appears diaphoretic and is having chills.  Over the phone, I am able to confirm with her that she felt okay at 6:00 this morning but at around 2 PM she started to develop chills and feeling feverish.  Additionally, she is complaining of pain at her sides over her lower ribs with breathing that is similar to the pain that she was having yesterday.  The gel is helpful for the pain but is not helpful after 3 hours.  We explained to her that we have her on a stronger version of pain medication, Tylenol 3, and that we hope this will help with the pain and coughing.  She also denies any pain with urination.  NST this morning was normal. Patient has no other questions at this time. Also, attempted to speak to husband over the phone, but did not answer.  Will attempt to call for confirmation of COVID testing at a later time. Received a page from the nurse shortly after leaving the room reporting that she was having some increased work of breathing.  Placed patient on oxygen to see if this would help with her work of breathin, she was satting in the low 90s on room air. Nurse to call me back with any updates.  Will call rapid OB for NST as patient appears to be declining in respect to vitals, work of breathing  Consult CCM for evaluation of this patient as she is high risk and may deteriorate quickly.  Genia Hotter, M.D.  Family Medicine  PGY-1 08/05/2018 4:56 PM

## 2018-08-05 NOTE — Progress Notes (Addendum)
Given patient's quick deterioration and increased work of breathing with new oxygen requirement to 3 L CCM was consulted.  Recommended stat ABG and albuterol.  They will see patient. Stat CXR also ordered.   [place] Sevon Rotert, DO PGY-2, [facility] Family Medicine

## 2018-08-05 NOTE — Progress Notes (Signed)
Maternal-Fetal Medicine (Chart Note)  Ms. Gladyce, [place] P2 at 30w 5d gestation, was admitted on 08/02/18 with shortness of breath. She had fever, tachycardia and tachypnea that was subsequently confirmed as COVID-19 infection.  Currently, her condition has deteriorated that may warrant intubation and mechanical ventilation. Based on her clinical features, the patient has severe disease that may progress to critical disease (respiratory failure/multiorgan dysfunction).  She does not have symptoms of preterm labor or rupture of membranes. NST is reassuring so far. Lab values are not in consistent with HELLP syndrome as raised by one provider on admission.  I reviewed her chart, labs and discussed with Dr. Jolayne Panther to plan optimal obstetrical care. COVID-19 in pregnancy -Patient has severe or critical illness and may require mechanical ventilation.  -Spontaneous preterm birth may be slightly increased in COVID-19, but she is more-likely to have indicated preterm delivery because of maternal or fetal compromise. I anticipate there is a 50% likelihood of preterm delivery.   -Given this high likelihood of preterm delivery, antenatal corticosteroids (betamethasone) will benefit the newborn. However, steroids can also worsen sepsis complications. If steroids are indicated for maternal reasons, I strongly recommend considering betamethasone in addition (or equipotent dose with prednisone/salumedrol as the choice may be) for 2 days (betamethasone intramuscular 12 mg q 24 hours x 2 doses).  -Tocolysis is not indicated in the absence of uterine contractions.  -Magnesium sulfate can cause respiratory depression in high doses (greater than 10 mEq/L). Since we do not anticipate impending delivery, it is not indicated now for fetal neuroprotection. However, if delivery is imminent and the patient is on ventilation, it may be considered after discussing with ICU team. Fetal benefit is smaller with magnesium and infants  of women who receive magnesium sulfate before delivery (and before 32 weeks' gestation) have lower rate of moderate-to-severe cerebral palsy.  -Heparin: Since maternal thrombosis risk is increased, prophylactic anticoagulation should be continued. Data is limited on therapeutic anticoagulation and is not indicated. If the patient is not intubated and needs delivery, she is likely to get regional anesthesia (spinal). I recommend unfractionated heparin 5,000 to 7,500 units q 12hourly till she completely recovers.  -Treatment of COVID-19 in pregnancy is largely supportive. Treatment with hydroxychloroquine, azithromycin or remdesivir are not used in pregnancy and they are investigational drugs only. Remdesivir has been used for compassionate care in some centers.  -Fetal hypoxia is very difficult to ascertain based on maternal signs and vitals. It is reasonable to keep maternal oxygen saturation above 95%. NST is the only tool we have to predict fetal hypoxia and is subject to false positive readings. Biophysical profile is not indicated and should be avoided at this gestational age.  Indications for delivery can be fetal or maternal. We would consider delivery if NST shows persistent decelerations. Nonreactive trace alone is not an indication since NST may be influenced by other factors including sedation.  Pregnancy can increase the risk of maternal hypoxemia from the physiological changes. Mechanical ventilation alone is not an indication for delivery. If at any stage it is deemed by ICU physicians that maternal respiratory or cardiovascular status is likely to remove by uterine decompression (delivery), cesarean section should be considered.   Our goal is to prolong pregnancy without maternal compromise so that the newborn is not at risk of prematurity.  If cardiac arrest occurs, emergency cesarean delivery should be performed.  Overall, we will follow recommendations based on management by ICU. No  intensive care management should be withheld because of her pregnancy  status. Most drugs are safe in pregnancy and the ICU team may discuss with our obstetricians.  Recommendations: -Consider betamethasone for fetal lung maturity. -Hold magnesium sulfate. It may be considered if the patient is intubated and delivery is imminent. Discuss with ICU team. -Thromboprophylaxis with unfractionated heparin 5,000 or 7,500 units q 12 hourly. -Lateral decubitus (to relive uterine pressure on major vessels) to be considered if the patient is not intubated. -Intermittent NST (2 or 3 times daily) should be sufficient if maternal oxygenation is ensured. Keep O2 sat at or above 95% to minimize fetal hypoxia. More frequent or continuous monitoring may be necessary that will be decided by obstetrician on call. -Ultrasound request only after discussing with MFM. -Family to be counseled on the possibility of cesarean delivery for maternal or fetal indications. -Vaginal delivery is not contraindicated if the patient goes into spontaneous active labor (history of previous 2 vaginal deliveries) provided there is no maternal or fetal compromise. -No antiviral treatments exist for COVID-19 infection.

## 2018-08-05 NOTE — Progress Notes (Signed)
In to see patient. [place] interpreter used. Patient found sitting comfortably in bed, visibly tachypenic and able to provide short answers.   Discussed with patient continued plan for twice daily fetal monitoring which has been reassuring thus far.  Discussed with patient plan for delivery via cesarean section if the patient is intubated under the following circumstances: non reassuring fetal heart tracing or worsening maternal status in which delivery would improve her prognosis. Risks of cesarean section were reviewed with the patient including but not limited to risks of bleeding, infection and damage to adjacent organs. Patient verbalized understanding and all questions were answered.  Consent for cesarean section obtained and signed

## 2018-08-05 NOTE — Progress Notes (Signed)
Spoke to patient over the phone and explained that different teams have been called to help take care of her.  This includes CCM and OB. Patient would like her husband is to be her emergency contact and medical decision maker should she be unable to make her own decisions.  She understands that she may be intubated when she goes to the ICU.  I also explained that the baby's heart rate was high on the last test which may indicate that she is stressed.  This is also a reason that we are going to escalate her care.  She has no questions at this time.  She reports that she does not feel scared or nervous right at this time.  Preferred Emergency Contact:husband "Om-lah" [phone] Spoke to patient's husband "Om-Lah" over the phone with an interpretor and explained that patient will be going to the ICU.  Repeated the same as above.  Patient was asking whether this is serious or not.  Replied to the patient that this is serious and that we are concerned that she will worsen quickly and may require intubation in the intensive care unit.  He is aware that he is her emergency contact and will keep his phone on him at all times.  I will call patient's husband and let him know where patient is moved to so that he can get in contact with the unit whenever he needs. Patient's husband also reports getting the COVID test at the health department.  He reports that he will not get his results until the 18th. Neither patient nor patient's husband speaks [place] and will absolutely require an interpreter. Please call the access line at [phone], press 6, and ask for a Burmese interpreter.   Additionally, earlier today, a nurse on 2 W who reported that the patient is having some financial worries as she reported her family was limited on money at home.  Patient's husband is currently being tested for COVID-19 and has been advised not to go to work.  Patient and patient's husband have 2 children at home, 77 and 53 years  old.  I have consulted social work and case management to help with any resources that may be available for this family. When patient is discharged, we will get the patient and her family arranged to be seen at [facility] if she would like.  Genia Hotter, M.D.  Family Medicine  PGY-1 08/05/2018 5:48 PM

## 2018-08-05 NOTE — Progress Notes (Signed)
NST performed.  Patient denies UC's, vaginal bleeding or LOF.  Reports good fetal movement. Fetal movement palpated. Abdomen soft to palpation. Occasional mild UC;s.

## 2018-08-05 NOTE — Progress Notes (Signed)
Reactive NST. Pt. Denies ctx, bleeding, or leaking of fluids. Attending notified. Awaiting CBC results from AM blood draw.

## 2018-08-05 NOTE — Progress Notes (Signed)
Dr Selena Batten concerned regarding pts sats, Please notify if 02 Sats drop to 88% on RA.

## 2018-08-05 NOTE — Progress Notes (Signed)
CALL PAGER [phone] for any questions or notifications regarding this patient   FMTS Attending Daily Note: Denny Levy MD  Attending pager:[phone]  office [phone]  I  have seen and examined this patient, reviewed their chart. I saw her earlier this afternoon and her exam revealed sweaty patient, wrapped in a blanket. Today for the first time she was compaining of bilateral anterior (diphragmatic area ) chest pain. Lung exam reveals coarse basilar sounds a little worse than previously and her RR is now closer to the 40s vs the previous time today in the 30s. She appears somewhat frightened. Language barrier makes it difficult.  Nursing is also concerned and has called rapid response. We have asked OB to do NST which revealed good heart tones byt at a rate of 180. We placed her on 2 L and her sat did not climb higher than the 92-93% she was presenting on room air.  I have ordered stat CXR. We called CCM and they have asked [place] to get ABG

## 2018-08-05 NOTE — Progress Notes (Signed)
Pt continue to look more fatigues, Resp 40 while sitting in chair. Pt continues to have pain in  Rib area near breast line. Dr Jennette Kettle has been updated, Rapid Response and Rapid OB have been notified of changes. Dr Jennette Kettle has ordered chest x ray, At present pt has moved back to bed for Rapid OB to monitor, Pt has denoted dry cough with movement.

## 2018-08-05 NOTE — Progress Notes (Signed)
Report given to [facility], pt to go to room 2113.

## 2018-08-05 NOTE — TOC Initial Note (Signed)
Transition of Care [facility]) - Initial/Assessment Note    Patient Details  Name: Alicia Riley MRN: [id number] Date of Birth: 1980-11-01  [facility] CM/SW Contact:    Leone Haven, RN Phone Number: 08/05/2018, 3:19 PM  Clinical Narrative:                 From home with spouse, she has no PCP, she will have transportation at discharge if her spouse can transport her, she will need ast with medications at discharge with [employer] .  She speaks [place].  She is covid positive.  Expected Discharge Plan: Home/Self Care Barriers to Discharge: Inadequate or no insurance   Patient Goals and CMS Choice Patient states their goals for this hospitalization and ongoing recovery are:: get better   Choice offered to / list presented to : NA  Expected Discharge Plan and Services Expected Discharge Plan: Home/Self Care In-house Referral: Financial Counselor Discharge Planning Services: CM Consult Post Acute Care Choice: NA Living arrangements for the past 2 months: Single Family Home Expected Discharge Date: 08/07/18               DME Arranged: N/A DME Agency: NA       HH Arranged: NA HH Agency: NA        Prior Living Arrangements/Services Living arrangements for the past 2 months: Single Family Home Lives with:: Spouse Patient language and need for interpreter reviewed:: Yes(interpreter used) Do you feel safe going back to the place where you live?: Yes      Need for Family Participation in Patient Care: Yes (Comment) Care giver support system in place?: Yes (comment)      Activities of Daily Living Home Assistive Devices/Equipment: None ADL Screening (condition at time of admission) Patient's cognitive ability adequate to safely complete daily activities?: Yes Is the patient deaf or have difficulty hearing?: No Does the patient have difficulty seeing, even when wearing glasses/contacts?: No Does the patient have difficulty concentrating, remembering, or  making decisions?: No Patient able to express need for assistance with ADLs?: No Does the patient have difficulty dressing or bathing?: No Independently performs ADLs?: Yes (appropriate for developmental age) Does the patient have difficulty walking or climbing stairs?: No Weakness of Legs: None Weakness of Arms/Hands: None  Permission Sought/Granted                  Emotional Assessment   Attitude/Demeanor/Rapport: (appropriate) Affect (typically observed): Appropriate Orientation: : Oriented to Self, Oriented to Place, Oriented to  Time, Oriented to Situation   Psych Involvement: No (comment)  Admission diagnosis:  COVID-19 [U07.1, J98.8] Patient Active Problem List   Diagnosis Date Noted  . Sepsis associated hypotension (HCC)   . COVID-19 08/02/2018  . Supervision of other normal pregnancy, antepartum 05/29/2018  . Late prenatal care affecting pregnancy 05/29/2018  . Language barrier affecting health care 05/11/2013  . Cervical polyp 05/11/2013   PCP:  Patient, No Pcp Per Pharmacy:   [id number] [place] Otto, [place] - [address] AT [facility] [address] [facility] [address] [place] [address] Phone: [phone] Fax: [phone]  [facility] [place], [place] - [address] [place] [address] Phone: [phone] Fax: [phone]     Social Determinants of Health (SDOH) Interventions    Readmission Risk Interventions No flowsheet data found.

## 2018-08-05 NOTE — Progress Notes (Addendum)
38 yo [id number] at [redacted]w[redacted]d admitted with covid19. Received a call from critical care regarding deteriorating respiratory status and possible need for intubation.  Discussed no limitation to albuterol or steroid to aid with respiratory status. If steroid needs to be given, preference for betamethasone 12 mg q24 hours Discussed no limitation to phentanyl for intubation Agree DVT prophylaxis given increased thrombotic risk associated with pregnancy and covid19, preference from unfractionated heparin in the event for need for emergency cesarean section. Plan for fetal monitoring with NST post intubation and to continue with twice daily NST Plan was discussed with Dr. Judeth Cornfield (MFM attending). No need for continuous monitoring at this time. Delivery via cesarean section for worsening fetal status or worsening maternal status. Will hold off on magnesium sulfate for CP prophylaxis given respiratory depression  Will update NICU on maternal status  Continue care per critical care Her husband was contacted (using Burmese interpreter) and made aware of this change in maternal/fetal status and agrees with the above discussed plan  Will continue to follow with you

## 2018-08-05 NOTE — Progress Notes (Signed)
Spoke with Dr Talbert Forest making her aware of pt looking more tired, remains tachyphemic and has a low grade fever. Due to language barrier unable to determine if pt feels her condition is changing.  Will continue to monitor pt at present time.

## 2018-08-06 DIAGNOSIS — J1289 Other viral pneumonia: Secondary | ICD-10-CM

## 2018-08-06 DIAGNOSIS — O98513 Other viral diseases complicating pregnancy, third trimester: Principal | ICD-10-CM

## 2018-08-06 DIAGNOSIS — J9601 Acute respiratory failure with hypoxia: Secondary | ICD-10-CM

## 2018-08-06 LAB — TROPONIN I: Troponin I: <0.03 ng/mL (ref <0.03)

## 2018-08-06 LAB — CBC WITH DIFFERENTIAL/PLATELET
Abs Immature Granulocytes: 0.06 10*3/uL (ref 0.00–0.07)
Basophils Absolute: 0 10*3/uL (ref 0.0–0.1)
Basophils Relative: 0 %
Eosinophils Absolute: 0 10*3/uL (ref 0.0–0.5)
Eosinophils Relative: 0 %
HCT: 26.3 % — ABNORMAL LOW (ref 36.0–46.0)
Hemoglobin: 8.5 g/dL — ABNORMAL LOW (ref 12.0–15.0)
Immature Granulocytes: 1 %
Lymphocytes Relative: 15 %
Lymphs Abs: 0.7 10*3/uL (ref 0.7–4.0)
MCH: 29.5 pg (ref 26.0–34.0)
MCHC: 32.3 g/dL (ref 30.0–36.0)
MCV: 91.3 fL (ref 80.0–100.0)
Monocytes Absolute: 0.2 10*3/uL (ref 0.1–1.0)
Monocytes Relative: 4 %
Neutro Abs: 3.9 10*3/uL (ref 1.7–7.7)
Neutrophils Relative %: 80 %
Platelets: 106 10*3/uL — ABNORMAL LOW (ref 150–400)
RBC: 2.88 MIL/uL — ABNORMAL LOW (ref 3.87–5.11)
RDW: 14.3 % (ref 11.5–15.5)
WBC: 4.9 10*3/uL (ref 4.0–10.5)
nRBC: 0 % (ref 0.0–0.2)

## 2018-08-06 LAB — COMPREHENSIVE METABOLIC PANEL
ALT: 18 U/L (ref 0–44)
AST: 26 U/L (ref 15–41)
Albumin: 2.3 g/dL — ABNORMAL LOW (ref 3.5–5.0)
Alkaline Phosphatase: 76 U/L (ref 38–126)
Anion gap: 14 (ref 5–15)
BUN: <5 mg/dL — ABNORMAL LOW (ref 6–20)
CO2: 15 mmol/L — ABNORMAL LOW (ref 22–32)
Calcium: 7.7 mg/dL — ABNORMAL LOW (ref 8.9–10.3)
Chloride: 107 mmol/L (ref 98–111)
Creatinine, Ser: 0.6 mg/dL (ref 0.44–1.00)
GFR calc Af Amer: >60 mL/min (ref >60)
GFR calc non Af Amer: >60 mL/min (ref >60)
Glucose, Bld: 72 mg/dL (ref 70–99)
Potassium: 3.5 mmol/L (ref 3.5–5.1)
Sodium: 136 mmol/L (ref 135–145)
Total Bilirubin: 0.4 mg/dL (ref 0.3–1.2)
Total Protein: 5.6 g/dL — ABNORMAL LOW (ref 6.5–8.1)

## 2018-08-06 LAB — HEMOGLOBIN AND HEMATOCRIT, BLOOD
HCT: 27 % — ABNORMAL LOW (ref 36.0–46.0)
Hemoglobin: 8.7 g/dL — ABNORMAL LOW (ref 12.0–15.0)

## 2018-08-06 LAB — PROCALCITONIN: Procalcitonin: 0.22 ng/mL

## 2018-08-06 LAB — LACTIC ACID, PLASMA: Lactic Acid, Venous: 0.5 mmol/L (ref 0.5–1.9)

## 2018-08-06 LAB — BRAIN NATRIURETIC PEPTIDE: B Natriuretic Peptide: 60.4 pg/mL (ref 0.0–100.0)

## 2018-08-06 MED ORDER — ALBUMIN HUMAN 5 % IV SOLN
25.0000 g | Freq: Once | INTRAVENOUS | Status: AC
  Administered 2018-08-06: 02:00:00 25 g via INTRAVENOUS

## 2018-08-06 MED ORDER — LACTATED RINGERS IV BOLUS
500.0000 mL | Freq: Once | INTRAVENOUS | Status: AC
  Administered 2018-08-06: 05:00:00 500 mL via INTRAVENOUS

## 2018-08-06 MED ORDER — IPRATROPIUM-ALBUTEROL 20-100 MCG/ACT IN AERS
1.0000 | INHALATION_SPRAY | Freq: Four times a day (QID) | RESPIRATORY_TRACT | Status: DC
  Filled 2018-08-06: qty 4

## 2018-08-06 MED ORDER — METHYLPREDNISOLONE SODIUM SUCC 125 MG IJ SOLR
40.0000 mg | Freq: Three times a day (TID) | INTRAMUSCULAR | Status: DC
  Administered 2018-08-06 – 2018-08-09 (×12): 40 mg via INTRAVENOUS
  Filled 2018-08-06 (×12): qty 2

## 2018-08-06 MED ORDER — SODIUM CHLORIDE 0.9 % IV SOLN
500.0000 mg | INTRAVENOUS | Status: AC
  Administered 2018-08-06 – 2018-08-10 (×5): 500 mg via INTRAVENOUS
  Filled 2018-08-06 (×5): qty 500

## 2018-08-06 MED ORDER — SODIUM CHLORIDE 0.9 % IV SOLN
250.0000 mL | INTRAVENOUS | Status: DC
  Administered 2018-08-06 – 2018-08-11 (×3): 250 mL via INTRAVENOUS

## 2018-08-06 MED ORDER — ENOXAPARIN SODIUM 40 MG/0.4ML ~~LOC~~ SOLN
40.0000 mg | Freq: Two times a day (BID) | SUBCUTANEOUS | Status: DC
  Administered 2018-08-06: 40 mg via SUBCUTANEOUS
  Filled 2018-08-06: qty 0.4

## 2018-08-06 MED ORDER — ACETAMINOPHEN 325 MG PO TABS
650.0000 mg | ORAL_TABLET | Freq: Three times a day (TID) | ORAL | Status: DC
  Administered 2018-08-06 – 2018-08-13 (×22): 650 mg via ORAL
  Filled 2018-08-06 (×24): qty 2

## 2018-08-06 MED ORDER — ENOXAPARIN SODIUM 40 MG/0.4ML ~~LOC~~ SOLN
40.0000 mg | Freq: Two times a day (BID) | SUBCUTANEOUS | Status: DC
  Administered 2018-08-06 – 2018-08-15 (×18): 40 mg via SUBCUTANEOUS
  Filled 2018-08-06 (×19): qty 0.4

## 2018-08-06 MED ORDER — ALBUMIN HUMAN 5 % IV SOLN
INTRAVENOUS | Status: AC
  Administered 2018-08-06: 02:00:00 25 g via INTRAVENOUS
  Filled 2018-08-06: qty 500

## 2018-08-06 MED ORDER — NOREPINEPHRINE 4 MG/250ML-% IV SOLN
0.0000 ug/min | INTRAVENOUS | Status: DC
  Administered 2018-08-06: 2 ug/min via INTRAVENOUS
  Administered 2018-08-06: 5 ug/min via INTRAVENOUS
  Filled 2018-08-06 (×2): qty 250

## 2018-08-06 MED ORDER — SODIUM CHLORIDE 0.9 % IV SOLN
1.0000 g | INTRAVENOUS | Status: DC
  Administered 2018-08-06: 1 g via INTRAVENOUS
  Filled 2018-08-06: qty 10

## 2018-08-06 MED ORDER — IPRATROPIUM-ALBUTEROL 20-100 MCG/ACT IN AERS
1.0000 | INHALATION_SPRAY | Freq: Four times a day (QID) | RESPIRATORY_TRACT | Status: DC | PRN
  Filled 2018-08-06: qty 4

## 2018-08-06 MED ORDER — SODIUM CHLORIDE 0.9 % IV SOLN
1.0000 g | INTRAVENOUS | Status: DC
  Administered 2018-08-06: 1 g via INTRAVENOUS
  Filled 2018-08-06 (×2): qty 10

## 2018-08-06 MED ORDER — SODIUM CHLORIDE 0.9 % IV SOLN
500.0000 mg | INTRAVENOUS | Status: DC
  Administered 2018-08-06: 500 mg via INTRAVENOUS
  Filled 2018-08-06: qty 500

## 2018-08-06 MED ORDER — LACTATED RINGERS IV BOLUS
500.0000 mL | Freq: Once | INTRAVENOUS | Status: AC
  Administered 2018-08-06: 500 mL via INTRAVENOUS

## 2018-08-06 NOTE — Progress Notes (Addendum)
eLink Physician-Brief Progress Note Patient Name: Alicia Riley DOB: 23-Sep-1980 MRN: [id number]   Date of Service  08/06/2018  HPI/Events of Note  Pt continues to be hypotensive despite 500 ml of Lactated Ringers and 500 ml of 5 % Albumin boluses. Reviewing her admission notes she is still likely in negative fluid balance overall but there is of course concern for the impact of volume resuscitation on her pulmonary status.  eICU Interventions  Will order one more bolus of 500 ml of LR, and if hypotension persists will start low dose Norepinephrine infusion.  This treatment approach was discussed with patient's OB/GYN Dr. Catalina Antigua who was fine with Norepinephrine infusion. Will check N-Peptide to indirectly gauge volume status.        Thomasene Lot Andrell Tallman 08/06/2018, 4:17 AM

## 2018-08-06 NOTE — Progress Notes (Signed)
eLink Physician-Brief Progress Note Patient Name: Alicia Riley DOB: Aug 14, 1980 MRN: [id number]   Date of Service  08/06/2018  HPI/Events of Note  Pt remains hypotensive despite a 500 ml iv LR fluid bolus  eICU Interventions  5 % Albumin 500 ml iv fluid bolus x 1, check lactate and hemoglobin     Intervention Category Major Interventions: Hypotension - evaluation and management  Okoronkwo U Ogan 08/06/2018, 1:42 AM

## 2018-08-06 NOTE — Progress Notes (Signed)
FACULTY PRACTICE ANTEPARTUM PROGRESS NOTE  Alicia Riley is a 38 y.o. [id number] at [redacted]w[redacted]d who is admitted for respiratory symptoms and found to have COVID-19.  Estimated Date of Delivery: 10/09/18 Fetal presentation is unsure.  Length of Stay:  4 Days. Admitted 08/02/2018  Subjective:  Per RN, patient has only been complaining of pain around ribs with deep breaths, has not complained of any cramping/contractions. Per RN, patient up and voiding, eating.   Vitals:  Blood pressure 96/63, pulse 97, temperature 98.7 F (37.1 C), temperature source Oral, resp. rate (!) 34, height 5\' 3"  (1.6 m), weight 63.4 kg, last menstrual period 01/02/2018, SpO2 99 %, unknown if currently breastfeeding. Physical Examination: Done from outside froom CONSTITUTIONAL: Well-developed, well-nourished female in mild distress, appears very fatigued but patient eating without difficulty RESPIRATORY: Effort appears normal ABDOMEN: gravid.  Fetal monitoring: FHR: 140 bpm, Variability: moderate, Accelerations: Present, Decelerations: Absent  Uterine activity:  contractions per hour   I have reviewed the patient's current medications.  ASSESSMENT: Principal Problem:   COVID-19 Active Problems:   Language barrier affecting health care   Late prenatal care affecting pregnancy   Sepsis associated hypotension (HCC)   PLAN: COVID-19/respiratory distress - per primary team, currently on levophed - if patient requires intubation, please alert OB team (phone numbers below) - no limit for albuterol/steroids for maternal benefit, would prefer betamethasone if receiving steroids (for fetal benefit, 12 mg Q 24 x 2 doses)  - agree with Heparin for DVT prophylaxis given sepsis & pregnancy  Fetal well being - twice daily NST by rapid response RN - maintain maternal oxygen saturation > 95% for fetal benefit, if patient is persistently lower than this, please call OBGYN on call - MgSO4 not recommended at this point   Mode of  delivery - patient would require c-section if intubated and there is non-reassuring fetal status or it is felt that delivery would improve worsening maternal status, this has been reviewed with her and she has consented to c-section  - would prefer vaginal delivery if no acute indication for delivery   As per MFM, we will follow recommendations by ICU team, no management should be withheld due to pregnancy status. Please call with any questions.  Appreciate ICU/critical care team care.  For OB/GYN issues, please call the Center for [facility] at [facility] Monday - Friday, 8 am - 5 pm: [phone] All other times: [phone]   K. Therese Sarah, M.D. Attending Center for [employer] (Faculty Practice)  08/06/2018 2:23 PM

## 2018-08-06 NOTE — Progress Notes (Addendum)
NAMEChely Riley, MRN:  [id number], DOB:  [birth year], LOS: 4 ADMISSION DATE:  08/02/2018, CONSULTATION DATE:  08/05/18 REFERRING MD:  Dr. Jennette Kettle , CHIEF COMPLAINT:  COVID-19 infection  Brief History   38 year old female who is 30 +[redacted] weeks pregnant admitted with respiratory symptoms and was found to be positive for Novel Coronavirus. She began to decompensate requiring supplemental oxygen and PCCM was consulted.   History of present illness   38 year old Burmese female (does not speak [place]) who is G4P2 and actively pregnant at [redacted] weeks and 2 days at time of presentation. She presented to [facility] ED on 5/9 with complaints of SOB, cough, and fever. Symptoms started 5/8. She did not require supplemental oxygen, but she was initially hypotensive, responded well to IVF resuscitation. Then overnight 5/11 >5/12 she developed worsening tachypnea and tachycardia. Fetal heart tones also elevated to 180 (expected 140). Throughout the day she became diaphoretic and hypoxic requiring up to 3L of supplemental oxygen via nasal cannula. For this reason PCCM was asked to see in consultation.   Past Medical History  G4P2  Significant Hospital Events   5/9 admit 5/12 transfer to ICu for worsening hypoxia.   Consults:  OB  Procedures:    Significant Diagnostic Tests:    Micro Data:  5/9 SARS Coronavirus 2 > positive 5/12 Blood culture >>>  Antimicrobials:  Azithromycin 5/12 > Ceftraixone 5/12 >  Interim history/subjective:  Oxygen needs have increased from 3 L to 6 L.  He has no acute distress at this time.  Placed on low molecular weight heparin Objective   Blood pressure 96/63, pulse 97, temperature (!) 97.4 F (36.3 C), temperature source Axillary, resp. rate (!) 34, height 5\' 3"  (1.6 m), weight 63.4 kg, last menstrual period 01/02/2018, SpO2 99 %, unknown if currently breastfeeding.        Intake/Output Summary (Last 24 hours) at 08/06/2018 1035 Last data filed at 08/06/2018 1017 Gross per  24 hour  Intake 440 ml  Output 4380 ml  Net -3940 ml   Filed Weights   08/03/18 0006 08/05/18 1800  Weight: 65.1 kg 63.4 kg    Examination: General: Awake alert female no acute distress HEENT: No JVD or lymphadenopathy is appreciated.  Oropharynx is unremarkable Neuro: Intact CV: s1s2 rrr, no m/r/g PULM: even/non-labored, lungs bilaterally diminished in the bases.  Currently on 6 L nasal cannula sats 97% YK:DXIP, non-tender, bsx4 active, pregnancy is noted Extremities: warm/dry, negative edema  Skin: no rashes or lesions  Resolved Hospital Problem list     Assessment & Plan:   COVID-19 infection: -Currently in the intensive care unit and in no distress - S supplemental oxygen currently on 6 L with sats of 97% -Bronchodilators via MDI -No diuresis at this time secondary to IV fluid challenges and hypotension -  monitor culture data -Currently on Rocephin and Zithromax -Questionable use of convalescent plasma exchange -Position changes as tolerated pregnancy impedes proning  Sepsis: secondary to above -IV fluid resuscitation is complete -08/06/2018 520 cc of lactated Ringer's at this time to hopefully avoid using Levophed.  Active pregnancy: - OB following  Some question she may be a candidate for convalescent plasma transfusion.  This will be complicated by language barrier.  Per MD.  She may be a candidate for convalescent plasma transfusion, however, language barrier will complicate this. Have discussed with Dr. Marchelle Gearing who has conferred with [facility]. Seeing as there is no [place]ese consent form, we could use the  english form if it was read verbatim to patient/husband via interpreter.  Best practice:  Diet: Regular  Pain/Anxiety/Delirium protocol (if indicated): N/a VAP protocol (if indicated): N/a DVT prophylaxis: lovenox pending d-dimer GI prophylaxis: n/a Glucose control: n/a Mobility: BR Code Status: FULL Family Communication: 08/06/2018 patient updated at  bedside Disposition: ICU  Labs   CBC: Recent Labs  Lab 08/02/18 1818 08/03/18 0016 08/04/18 0722 08/05/18 0638 08/06/18 0300 08/06/18 0709  WBC 6.6 6.4 5.3 5.4 4.9  --   NEUTROABS 5.2 5.1  --  4.4 3.9  --   HGB 9.6* 9.4* 8.3* 8.9* 8.5* 8.7*  HCT 29.9* 28.1* 25.3* 26.5* 26.3* 27.0*  MCV 92.9 89.5 91.0 89.8 91.3  --   PLT 132* 120* 107* 110* 106*  --     Basic Metabolic Panel: Recent Labs  Lab 08/02/18 1818 08/03/18 0016 08/04/18 0722 08/04/18 1427 08/05/18 0638 08/06/18 0300  NA 131* 135 135  --  136 136  K 2.8* 3.1* 2.6* 3.5 3.5 3.5  CL 101 107 111  --  111 107  CO2 19* 18* 17*  --  16* 15*  GLUCOSE 96 95 128*  --  84 72  BUN 7 <5* <5*  --  <5* <5*  CREATININE 0.50 0.45 0.45  --  0.45 0.60  CALCIUM 7.8* 7.9* 7.5*  --  7.8* 7.7*  MG  --   --  1.6*  --  1.7  --    GFR: Estimated Creatinine Clearance: 86.3 mL/min (by C-G formula based on SCr of 0.6 mg/dL). Recent Labs  Lab 08/02/18 2126 08/03/18 0016 08/04/18 0722 08/05/18 0638 08/05/18 2010 08/05/18 2015 08/06/18 0300  PROCALCITON  --   --   --   --  0.20  --  0.22  WBC  --  6.4 5.3 5.4  --   --  4.9  LATICACIDVEN 0.8  --   --   --   --  0.7 0.5    Liver Function Tests: Recent Labs  Lab 08/02/18 1818 08/03/18 0016 08/05/18 0638 08/06/18 0300  AST 25 25 25 26   ALT 21 18 17 18   ALKPHOS 70 63 71 76  BILITOT 0.4 0.6 0.5 0.4  PROT 6.2* 5.7* 5.7* 5.6*  ALBUMIN 2.9* 2.6* 2.5* 2.3*   No results for input(s): LIPASE, AMYLASE in the last 168 hours. No results for input(s): AMMONIA in the last 168 hours.  ABG    Component Value Date/Time   PHART 7.400 08/05/2018 1720   PCO2ART 27.9 (L) 08/05/2018 1720   PO2ART 109 (H) 08/05/2018 1720   HCO3 16.8 (L) 08/05/2018 1720   ACIDBASEDEF 7.0 (H) 08/05/2018 1720   O2SAT 97.7 08/05/2018 1720     Coagulation Profile: No results for input(s): INR, PROTIME in the last 168 hours.  Cardiac Enzymes: Recent Labs  Lab 08/05/18 2010 08/06/18 0300  TROPONINI  <0.03 <0.03    HbA1C: No results found for: HGBA1C  CBG: Recent Labs  Lab 08/03/18 0013  GLUCAP 86     Critical care time:      [facility] Kaci Dillie ACNP [facility] PCCM Pager [phone] till 1 pm If no answer page [phone] 08/06/2018, 10:35 AM

## 2018-08-06 NOTE — Progress Notes (Signed)
NST performed by Aura Dials RN. I reviewed the tracing at 0005. Fetal heart tones 165bpm, with good variability, 15x15 accelerations, and no decelerations.  Dr. Jolayne Panther updated on patient status.

## 2018-08-06 NOTE — Progress Notes (Signed)
eLink Physician-Brief Progress Note Patient Name: Alicia Riley DOB: 1980/06/09 MRN: [id number]   Date of Service  08/06/2018  HPI/Events of Note  Pregnant, Covid-19 patient with hypotension. Last hemoglobin 24 hours ago was 8.9 gm %  eICU Interventions  LR 500 ml iv fluid bolus x 1        Keyerra Lamere U Elia Keenum 08/06/2018, 12:58 AM

## 2018-08-06 NOTE — Progress Notes (Addendum)
CSW acknowledges consult for patient due to her being [redacted] weeks pregnant and positive for COVID. CSW will monitor chart and make recommendations based on patient's status.  CSW spoke with Axel Filler to discuss the patient's need for Medicaid. Karel Jarvis stated that she has already spoke with the patient and that the application for Medicaid will be completed.   Edwin Dada, MSW, LCSW-A Clinical Social Worker Emergency Department [employer] [phone]

## 2018-08-06 NOTE — Progress Notes (Signed)
NST performed by RROB. FHR baseline is 140bpm with moderate variability, 15X15 accelerations and no decels. Dr Ashok Pall notified of reactive NST

## 2018-08-07 ENCOUNTER — Inpatient Hospital Stay (HOSPITAL_COMMUNITY): Payer: Medicaid Other

## 2018-08-07 LAB — BASIC METABOLIC PANEL
Anion gap: 15 (ref 5–15)
BUN: <5 mg/dL — ABNORMAL LOW (ref 6–20)
CO2: 12 mmol/L — ABNORMAL LOW (ref 22–32)
Calcium: 8.5 mg/dL — ABNORMAL LOW (ref 8.9–10.3)
Chloride: 111 mmol/L (ref 98–111)
Creatinine, Ser: 0.6 mg/dL (ref 0.44–1.00)
GFR calc Af Amer: >60 mL/min (ref >60)
GFR calc non Af Amer: >60 mL/min (ref >60)
Glucose, Bld: 154 mg/dL — ABNORMAL HIGH (ref 70–99)
Potassium: 3.7 mmol/L (ref 3.5–5.1)
Sodium: 138 mmol/L (ref 135–145)

## 2018-08-07 LAB — PHOSPHORUS: Phosphorus: 3.4 mg/dL (ref 2.5–4.6)

## 2018-08-07 LAB — CBC WITH DIFFERENTIAL/PLATELET
Abs Immature Granulocytes: 0.13 10*3/uL — ABNORMAL HIGH (ref 0.00–0.07)
Basophils Absolute: 0 10*3/uL (ref 0.0–0.1)
Basophils Relative: 0 %
Eosinophils Absolute: 0 10*3/uL (ref 0.0–0.5)
Eosinophils Relative: 0 %
HCT: 29.7 % — ABNORMAL LOW (ref 36.0–46.0)
Hemoglobin: 9.7 g/dL — ABNORMAL LOW (ref 12.0–15.0)
Immature Granulocytes: 2 %
Lymphocytes Relative: 10 %
Lymphs Abs: 0.7 10*3/uL (ref 0.7–4.0)
MCH: 29.8 pg (ref 26.0–34.0)
MCHC: 32.7 g/dL (ref 30.0–36.0)
MCV: 91.4 fL (ref 80.0–100.0)
Monocytes Absolute: 0.2 10*3/uL (ref 0.1–1.0)
Monocytes Relative: 3 %
Neutro Abs: 6 10*3/uL (ref 1.7–7.7)
Neutrophils Relative %: 85 %
Platelets: 144 10*3/uL — ABNORMAL LOW (ref 150–400)
RBC: 3.25 MIL/uL — ABNORMAL LOW (ref 3.87–5.11)
RDW: 14.5 % (ref 11.5–15.5)
WBC: 7 10*3/uL (ref 4.0–10.5)
nRBC: 0 % (ref 0.0–0.2)

## 2018-08-07 LAB — PROCALCITONIN: Procalcitonin: 0.16 ng/mL

## 2018-08-07 LAB — GLUCOSE, CAPILLARY
Glucose-Capillary: 98 mg/dL (ref 70–99)
Glucose-Capillary: 170 mg/dL — ABNORMAL HIGH (ref 70–99)
Glucose-Capillary: 99 mg/dL (ref 70–99)

## 2018-08-07 LAB — C-REACTIVE PROTEIN: CRP: 9.2 mg/dL — ABNORMAL HIGH (ref <1.0)

## 2018-08-07 LAB — MAGNESIUM: Magnesium: 1.6 mg/dL — ABNORMAL LOW (ref 1.7–2.4)

## 2018-08-07 MED ORDER — LACTATED RINGERS IV BOLUS
500.0000 mL | Freq: Once | INTRAVENOUS | Status: AC
  Administered 2018-08-07: 500 mL via INTRAVENOUS

## 2018-08-07 MED ORDER — MAGNESIUM SULFATE 2 GM/50ML IV SOLN
2.0000 g | Freq: Once | INTRAVENOUS | Status: AC
  Administered 2018-08-07: 2 g via INTRAVENOUS
  Filled 2018-08-07: qty 50

## 2018-08-07 MED ORDER — INSULIN ASPART 100 UNIT/ML ~~LOC~~ SOLN
3.0000 [IU] | SUBCUTANEOUS | Status: DC
  Administered 2018-08-07: 6 [IU] via SUBCUTANEOUS
  Administered 2018-08-09 – 2018-08-11 (×4): 3 [IU] via SUBCUTANEOUS
  Administered 2018-08-12: 6 [IU] via SUBCUTANEOUS
  Administered 2018-08-12 – 2018-08-14 (×3): 3 [IU] via SUBCUTANEOUS

## 2018-08-07 MED ORDER — MENTHOL 3 MG MT LOZG
1.0000 | LOZENGE | OROMUCOSAL | Status: AC | PRN
  Administered 2018-08-08: 3 mg via ORAL
  Filled 2018-08-07 (×2): qty 9

## 2018-08-07 MED ORDER — SODIUM CHLORIDE 0.9 % IV SOLN
2.0000 g | INTRAVENOUS | Status: AC
  Administered 2018-08-07 – 2018-08-12 (×6): 2 g via INTRAVENOUS
  Filled 2018-08-07 (×6): qty 20

## 2018-08-07 NOTE — Progress Notes (Signed)
Pt resting comfortably, sleeping off and on.  Denies pain or UC's, vaginal bleeding, and LOF.  Reports good fetal movement.

## 2018-08-07 NOTE — Progress Notes (Addendum)
NAMETraniece Riley, MRN:  [id number], DOB:  10-23-1980, LOS: 5 ADMISSION DATE:  08/02/2018, CONSULTATION DATE:  08/05/18 REFERRING MD:  Dr. Jennette Kettle , CHIEF COMPLAINT:  COVID-19 infection  Brief History   38 year old female who is 30 +[redacted] weeks pregnant admitted with respiratory symptoms and was found to be positive for Novel Coronavirus. She began to decompensate requiring supplemental oxygen and PCCM was consulted.   History of present illness   38 year old [place] female (does not speak [place]) who is G4P2 and actively pregnant at [redacted] weeks and 2 days at time of presentation. She presented to [facility] ED on 5/9 with complaints of SOB, cough, and fever. Symptoms started 5/8. She did not require supplemental oxygen, but she was initially hypotensive, responded well to IVF resuscitation. Then overnight 5/11 >5/12 she developed worsening tachypnea and tachycardia. Fetal heart tones also elevated to 180 (expected 140). Throughout the day she became diaphoretic and hypoxic requiring up to 3L of supplemental oxygen via nasal cannula. For this reason PCCM was asked to see in consultation.   Past Medical History  G4P2  Significant Hospital Events   5/9 admit 5/12 transfer to ICu for worsening hypoxia.   Consults:  OB  Procedures:    Significant Diagnostic Tests:    Micro Data:  5/9 SARS Coronavirus 2 > positive 5/12 Blood culture >>>  Antimicrobials:  Azithromycin 5/12 > Ceftraixone 5/12 >  Interim history/subjective:  Oxygen needs have decreased from 6 to 4 L/min via nasal cannula She is a negative I&O despite fluid boluses. Objective   Blood pressure 92/67, pulse 88, temperature 98 F (36.7 C), temperature source Oral, resp. rate (!) 44, height 5\' 3"  (1.6 m), weight 63.4 kg, last menstrual period 01/02/2018, SpO2 95 %, unknown if currently breastfeeding.        Intake/Output Summary (Last 24 hours) at 08/07/2018 0910 Last data filed at 08/07/2018 0900 Gross per 24 hour  Intake  1756.44 ml  Output 4625 ml  Net -2868.56 ml   Filed Weights   08/03/18 0006 08/05/18 1800  Weight: 65.1 kg 63.4 kg    Examination: General: 38 year old Asian female no acute distress sitting up able to void still has a dry hacking cough HEENT: No JVD or lymphadenopathy is appreciated Neuro: Grossly intact CV: s1s2 rrr, no m/r/g PULM: even/non-labored, lungs bilaterally mild rhonchi.  Currently on 4 L nasal cannula sats 96% OH:YWVP, non-tender, bsx4 active.  Pregnancy noted Extremities: warm/dry, negative edema  Skin: no rashes or lesions   Resolved Hospital Problem list     Assessment & Plan:   COVID-19 infection: -Currently intensive care unit in no acute distress. -Supplemental oxygen has been decreased from 6 L/min to 4 L/min with O2 saturations 96% -Bronchodilators via MDI -No diuresis for hypertension -  monitor culture data -Currently on Rocephin and Zithromax empirical treatment for questionable pneumonia -Questionable use of convalescent plasma exchange -Position changes as tolerated -Added incentive spirometer and flutter valve to pulmonary toilet regimen -Lactated Ringer's 500 cc an attempt to get her off Levophed -Remains on Solu-Medrol 40 mg IV every 8h, -Continue LMWH  Sepsis: secondary to above - 08/07/2018 she is on low-dose Levophed at 2 mics , lactated Ringer's 500 cc x 1 in attempt to discontinue Levophed  Steroid induced hyperglycemia -SSI protocol   Active pregnancy: -OB following  Some question she may be a candidate for convalescent plasma transfusion.  This will be complicated by language barrier.  Per MD.  She may be  a candidate for convalescent plasma transfusion, however, language barrier will complicate this. Have discussed with Dr. Marchelle Gearing who has conferred with [facility]. Seeing as there is no [place]ese consent form, we could use the english form if it was read verbatim to patient/husband via interpreter.  Best practice:  Diet:  Regular  Pain/Anxiety/Delirium protocol (if indicated): N/a VAP protocol (if indicated): N/a DVT prophylaxis: lovenox pending d-dimer GI prophylaxis: n/a Glucose control: n/a Mobility: BR Code Status: FULL Family Communication: 08/07/2018 patient updated at bedside Disposition: ICU  Labs   CBC: Recent Labs  Lab 08/02/18 1818 08/03/18 0016 08/04/18 0722 08/05/18 1610 08/06/18 0300 08/06/18 0709 08/07/18 0301  WBC 6.6 6.4 5.3 5.4 4.9  --  7.0  NEUTROABS 5.2 5.1  --  4.4 3.9  --  6.0  HGB 9.6* 9.4* 8.3* 8.9* 8.5* 8.7* 9.7*  HCT 29.9* 28.1* 25.3* 26.5* 26.3* 27.0* 29.7*  MCV 92.9 89.5 91.0 89.8 91.3  --  91.4  PLT 132* 120* 107* 110* 106*  --  144*    Basic Metabolic Panel: Recent Labs  Lab 08/03/18 0016 08/04/18 0722 08/04/18 1427 08/05/18 0638 08/06/18 0300 08/07/18 0301  NA 135 135  --  136 136 138  K 3.1* 2.6* 3.5 3.5 3.5 3.7  CL 107 111  --  111 107 111  CO2 18* 17*  --  16* 15* 12*  GLUCOSE 95 128*  --  84 72 154*  BUN <5* <5*  --  <5* <5* <5*  CREATININE 0.45 0.45  --  0.45 0.60 0.60  CALCIUM 7.9* 7.5*  --  7.8* 7.7* 8.5*  MG  --  1.6*  --  1.7  --  1.6*  PHOS  --   --   --   --   --  3.4   GFR: Estimated Creatinine Clearance: 86.3 mL/min (by C-G formula based on SCr of 0.6 mg/dL). Recent Labs  Lab 08/02/18 2126  08/04/18 0722 08/05/18 0638 08/05/18 2010 08/05/18 2015 08/06/18 0300 08/07/18 0301  PROCALCITON  --   --   --   --  0.20  --  0.22 0.16  WBC  --    < > 5.3 5.4  --   --  4.9 7.0  LATICACIDVEN 0.8  --   --   --   --  0.7 0.5  --    < > = values in this interval not displayed.    Liver Function Tests: Recent Labs  Lab 08/02/18 1818 08/03/18 0016 08/05/18 9604 08/06/18 0300  AST ALT ALKPHOS 70 63 71 76  BILITOT 0.4 0.6 0.5 0.4  PROT 6.2* 5.7* 5.7* 5.6*  ALBUMIN 2.9* 2.6* 2.5* 2.3*   No results for input(s): LIPASE, AMYLASE in the last 168 hours. No results for input(s): AMMONIA in the last 168 hours.   ABG    Component Value Date/Time   PHART 7.400 08/05/2018 1720   PCO2ART 27.9 (L) 08/05/2018 1720   PO2ART 109 (H) 08/05/2018 1720   HCO3 16.8 (L) 08/05/2018 1720   ACIDBASEDEF 7.0 (H) 08/05/2018 1720   O2SAT 97.7 08/05/2018 1720     Coagulation Profile: No results for input(s): INR, PROTIME in the last 168 hours.  Cardiac Enzymes: Recent Labs  Lab 08/05/18 2010 08/06/18 0300  TROPONINI <0.03 <0.03    HbA1C: No results found for: HGBA1C  CBG: Recent Labs  Lab 08/03/18 0013  GLUCAP 86     Critical care time: 30  min     Brett CanalesSteve Evaleen Sant ACNP [facility] Pager [phone] till 1 pm If no answer page [phone]- [phone] 08/07/2018, 9:10 AM

## 2018-08-07 NOTE — Progress Notes (Signed)
eLink Physician-Brief Progress Note Patient Name: Alicia Riley DOB: 31-Oct-1980 MRN: [id number]   Date of Service  08/07/2018  HPI/Events of Note  Dry irritated throat  eICU Interventions  Cepacol lozenges prn x 3 day        Migdalia Dk 08/07/2018, 8:45 PM

## 2018-08-07 NOTE — Progress Notes (Addendum)
FACULTY PRACTICE ANTEPARTUM PROGRESS NOTE  Alicia Riley is a 38 y.o. [id number] at [redacted]w[redacted]d who is admitted for respiratory symptoms with COVID-19.  Estimated Date of Delivery: 10/09/18 Fetal presentation is unsure.  Length of Stay:  5 Days. Admitted 08/02/2018  Subjective:  Per RN, patient is not complaining of any contractions, leaking or bleeding. Per RN, patient continues to have some rib pain but otherwise is doing well. Per RN, levophed has been stopped and O2 [place] has been weaned to 3 L/min.  Vitals:  Blood pressure 111/70, pulse (!) 108, temperature 98 F (36.7 C), temperature source Oral, resp. rate (!) 31, height 5\' 3"  (1.6 m), weight 63.4 kg, last menstrual period 01/02/2018, SpO2 90 %, unknown if currently breastfeeding. Physical Examination: CONSTITUTIONAL: Well-developed, well-nourished female sleeping on my arrival, appears fatigued RESPIRATORY: Effort appears normal ABDOMEN: gravid.  Fetal monitoring not yet done today, last pm as below Fetal monitoring: FHR: 135 bpm, Variability: moderate, Accelerations: Present, Decelerations: Absent  Uterine activity: 2-3 contractions per hour   I have reviewed the patient's current medications.  ASSESSMENT: Principal Problem:   COVID-19 Active Problems:   Language barrier affecting health care   Late prenatal care affecting pregnancy   Sepsis associated hypotension (HCC)   PLAN: COVID-19/respiratory distress - per primary team, please do not withhold preferred management due to pregnancy status, if questions, call OB team - if patient requires intubation, please alert OB team (phone numbers below) - no limit for albuterol/steroids for maternal benefit, would prefer betamethasone if receiving steroids (for fetal benefit, 12 mg Q 24 x 2 doses)  - cont lovenox for DVT ppx  Fetal well being - twice daily NST by rapid response RN, has not been done yet today - maintain maternal oxygen saturation > 95% for fetal benefit, if patient is  persistently lower than this, please call OBGYN on call - no MgSO4 - NICU team aware, will do remote consult later today  Mode of delivery - patient would require c-section if intubated and there is non-reassuring fetal status or it is felt that delivery would improve worsening maternal status, this has been reviewed with her and she has consented to c-section, otherwise, would prefer vaginal delivery  Pregnancy - daily prenatal vitamin when tolerating PO well - will need glucose tolerance testing, this can be done when maternal status is stable  Appreciate ICU/critical care team care.  For OB/GYN issues, please call the Center for [facility] Healthcare at [facility] Practice Monday - Friday, 8 am - 5 pm: [phone] All other times: [phone]   K. Therese Sarah, M.D. Attending Center for [employer] [facility] (Faculty Practice)  08/07/2018 1:58 PM

## 2018-08-08 ENCOUNTER — Other Ambulatory Visit: Payer: Medicaid Other

## 2018-08-08 ENCOUNTER — Encounter: Payer: Medicaid Other | Admitting: Obstetrics and Gynecology

## 2018-08-08 DIAGNOSIS — J9601 Acute respiratory failure with hypoxia: Secondary | ICD-10-CM

## 2018-08-08 DIAGNOSIS — Z3A31 31 weeks gestation of pregnancy: Secondary | ICD-10-CM

## 2018-08-08 LAB — D-DIMER, QUANTITATIVE: D-Dimer, Quant: 0.8 ug/mL-FEU — ABNORMAL HIGH (ref 0.00–0.50)

## 2018-08-08 LAB — GLUCOSE, CAPILLARY
Glucose-Capillary: 107 mg/dL — ABNORMAL HIGH (ref 70–99)
Glucose-Capillary: 107 mg/dL — ABNORMAL HIGH (ref 70–99)
Glucose-Capillary: 110 mg/dL — ABNORMAL HIGH (ref 70–99)
Glucose-Capillary: 113 mg/dL — ABNORMAL HIGH (ref 70–99)
Glucose-Capillary: 116 mg/dL — ABNORMAL HIGH (ref 70–99)
Glucose-Capillary: 95 mg/dL (ref 70–99)

## 2018-08-08 LAB — POCT I-STAT 7, (LYTES, BLD GAS, ICA,H+H)
Acid-base deficit: 4 mmol/L — ABNORMAL HIGH (ref 0.0–2.0)
Bicarbonate: 20 mmol/L (ref 20.0–28.0)
Calcium, Ion: 1.2 mmol/L (ref 1.15–1.40)
HCT: 29 % — ABNORMAL LOW (ref 36.0–46.0)
Hemoglobin: 9.9 g/dL — ABNORMAL LOW (ref 12.0–15.0)
O2 Saturation: 96 %
Patient temperature: 98.6
Potassium: 3.4 mmol/L — ABNORMAL LOW (ref 3.5–5.1)
Sodium: 136 mmol/L (ref 135–145)
TCO2: 21 mmol/L — ABNORMAL LOW (ref 22–32)
pCO2 arterial: 31.1 mmHg — ABNORMAL LOW (ref 32.0–48.0)
pH, Arterial: 7.416 (ref 7.350–7.450)
pO2, Arterial: 78 mmHg — ABNORMAL LOW (ref 83.0–108.0)

## 2018-08-08 LAB — BASIC METABOLIC PANEL
Anion gap: 11 (ref 5–15)
BUN: <5 mg/dL — ABNORMAL LOW (ref 6–20)
CO2: 18 mmol/L — ABNORMAL LOW (ref 22–32)
Calcium: 8.1 mg/dL — ABNORMAL LOW (ref 8.9–10.3)
Chloride: 107 mmol/L (ref 98–111)
Creatinine, Ser: 0.43 mg/dL — ABNORMAL LOW (ref 0.44–1.00)
GFR calc Af Amer: >60 mL/min (ref >60)
GFR calc non Af Amer: >60 mL/min (ref >60)
Glucose, Bld: 120 mg/dL — ABNORMAL HIGH (ref 70–99)
Potassium: 3.5 mmol/L (ref 3.5–5.1)
Sodium: 136 mmol/L (ref 135–145)

## 2018-08-08 LAB — PHOSPHORUS: Phosphorus: 3.7 mg/dL (ref 2.5–4.6)

## 2018-08-08 LAB — MAGNESIUM: Magnesium: 1.8 mg/dL (ref 1.7–2.4)

## 2018-08-08 MED ORDER — FUROSEMIDE 10 MG/ML IJ SOLN
INTRAMUSCULAR | Status: AC
  Administered 2018-08-08: 20 mg via INTRAVENOUS
  Filled 2018-08-08: qty 2

## 2018-08-08 MED ORDER — FUROSEMIDE 10 MG/ML IJ SOLN
20.0000 mg | Freq: Once | INTRAMUSCULAR | Status: AC
  Administered 2018-08-08: 20 mg via INTRAVENOUS
  Filled 2018-08-08: qty 2

## 2018-08-08 MED ORDER — FUROSEMIDE 10 MG/ML IJ SOLN
20.0000 mg | Freq: Once | INTRAMUSCULAR | Status: AC
  Administered 2018-08-08: 20 mg via INTRAVENOUS

## 2018-08-08 MED ORDER — POTASSIUM CHLORIDE CRYS ER 20 MEQ PO TBCR
40.0000 meq | EXTENDED_RELEASE_TABLET | Freq: Once | ORAL | Status: AC
  Administered 2018-08-08: 40 meq via ORAL
  Filled 2018-08-08: qty 2

## 2018-08-08 NOTE — Progress Notes (Signed)
FMTS continues to follow on Alicia Riley's progress while in the ICU. Her respiratory status appears to be improving, but continues to require ICU level care. We will resume care when CCM assesses Alicia Riley as stable and is ready to transfer care back to FMTS.   Please page [phone] for any questions  Genia Hotter, M.D.  Family Medicine  PGY-1 08/08/2018 7:48 AM

## 2018-08-08 NOTE — Significant Event (Addendum)
PCCM OVERNIGHT  38 y.o. [id number] at [redacted]w[redacted]d who is admitted for respiratory status/COVID-19 Previously on 5L [place] today received 20 mg Lasix IV w/ UOP response (>1450cc) and improvement in respiratory status able to decrease back to 3L [place] and was improved  Desaturated this evening at 2100 To O2 sat 87%  Was increased back to 5 L then NRB at 15 L due to saturation not responding and patient visibly in distress.  45 mins>>1 hr of NRB  ABG 7.41/ 31/78/20 O2 sat max 94% Added [place] under NRB Pt O2 sat 99% more comfortable in appearance  I have discussed this case with Dr Marya Fossa [phone]), Dr Grace Bushy MFM [phone]), and Dr Hart [place] Anesthesia [phone]  Plan: 1.Diuresis with Lasix IV to see if I can decrease O2 requirements.  If pt does not respond/ unable to wean from NRB back to [place] or experiences increased resp distress or desaturations >>>> plan to intubate in OR w/ Anesthesia. (Per Anesthesia pt will need to be transported with Travel vent and closed circuit to minimize risk to staff.)  2. Type and screen and CBC>> in preparation for possible section  Because of the increased risk of decompensation in this patient population OBGYN is aware of the possibility for emergent C section. MFM has recommended continuous fetal monitoring during intubation.  We have discussed C-section post intubation given the fact that intubation represents respiratory instability. There is a risk of hemodynamic instability and cardiac arrest following endotracheal intubation. I strongly recommend that if this patient requires endotracheal intubation that the fetus (31 weeks 1 day) be delivered. It would facilitate ICU mgmt of patient and make pt a candidate for advanced therapies that are not indicated in pregnant patients.    Signed Dr Newell Coral Pulmonary Critical Care Locums

## 2018-08-08 NOTE — Progress Notes (Signed)
NAMECatelynn Riley, MRN:  [id number], DOB:  Jan 12, 1981, LOS: 6 ADMISSION DATE:  08/02/2018, CONSULTATION DATE:  08/05/18 REFERRING MD:  Dr. Jennette Kettle , CHIEF COMPLAINT:  COVID-19 infection  Brief History   38 year old female who is 30 +[redacted] weeks pregnant admitted with respiratory symptoms and was found to be positive for Novel Coronavirus. She began to decompensate requiring supplemental oxygen and PCCM was consulted.   History of present illness   38 year old Bur[place]ese female (does not speak [place]) who is G4P2 and actively pregnant at [redacted] weeks and 2 days at time of presentation. She presented to [facility] ED on 5/9 with complaints of SOB, cough, and fever. Symptoms started 5/8. She did not require supplemental oxygen, but she was initially hypotensive, responded well to IVF resuscitation. Then overnight 5/11 >5/12 she developed worsening tachypnea and tachycardia. Fetal heart tones also elevated to 180 (expected 140). Throughout the day she became diaphoretic and hypoxic requiring up to 3L of supplemental oxygen via nasal cannula. For this reason PCCM was asked to see in consultation.   Past Medical History  G4P2  Significant Hospital Events   5/9 admit 5/12 transfer to ICu for worsening hypoxia.   Consults:  OB  Procedures:    Significant Diagnostic Tests:    Micro Data:  5/9 SARS Coronavirus 2 > positive 5/12 Blood culture >>>  Antimicrobials:  Azithromycin 5/12 > Ceftraixone 5/12 >  Interim history/subjective:  Noted to be more dyspneic. Objective   Blood pressure 93/62, pulse 99, temperature 98.5 F (36.9 C), temperature source Oral, resp. rate (!) 26, height  (1.6 m), weight 63.4 kg, last menstrual period 01/02/2018, SpO2 91 %, unknown if currently breastfeeding.        Intake/Output Summary (Last 24 hours) at 08/08/2018 0906 Last data filed at 08/08/2018 0800 Gross per 24 hour  Intake 1541.23 ml  Output 3475 ml  Net -1933.77 ml   Filed Weights   08/03/18 0006  08/05/18 1800  Weight: 65.1 kg 63.4 kg    Examination: General: 38 year old female whose has increased respiratory stress following use of bathroom HEENT: No JVD or lymphadenopathy is appreciated Neuro: Mostly intact CV: s1s2 rrr, no m/r/g PULM: even/non-labored, lungs bilaterally decreased air movement with shallow respirations XB:JYNW, non-tender, bsx4 active, pregnancy is noted  extremities: warm/dry, negative edema  Skin: no rashes or lesions    Resolved Hospital Problem list     Assessment & Plan:   COVID-19 infection: -Remain in intensive care unit for worsening respiratory distress -08/08/2018 she is required nonrebreather due to desaturations -Bronchodilators via MDI -08/08/2018 we will try low-dose diuresis although she appears to be auto diuresing at this time. -Continue to monitor culture data, as of 515 2020-day 3 of antimicrobial therapy no growth to date -Currently on Rocephin and Zithromax empirical treatment for questionable pneumonia -Questionable use of convalescent plasma exchange -Position changes as tolerated -Continue flutter valve -Continue Solu-Medrol 40 mg every 8 hours would not decrease dose at this time due to worsening respiratory failure -Continue low molecular weight heparin at twice daily dosing  Sepsis: secondary to above  -08/08/2018 she is off Levophed.  Will try low-dose diuresis on 08/08/2018 due to increasing respiratory distress  Steroid induced hyperglycemia CBG (last 3)  Recent Labs    08/07/18 1956 08/08/18 0007 08/08/18 0324  GLUCAP 99 110* 116*    -Sliding scale insulin protocol   Active pregnancy: -OB is following  Some question she may be a candidate for convalescent plasma  transfusion.  This will be complicated by language barrier.  Per MD.  She may be a candidate for convalescent plasma transfusion, however, language barrier will complicate this. Have discussed with Dr. Marchelle Gearing who has conferred with [facility].  Seeing as there is no [place] consent form, we could use the english form if it was read verbatim to patient/husband via interpreter.  Best practice:  Diet: Regular  Pain/Anxiety/Delirium protocol (if indicated): N/a VAP protocol (if indicated): N/a DVT prophylaxis: lovenox pending d-dimer GI prophylaxis: n/a Glucose control: n/a Mobility: BR Code Status: FULL Family Communication: 08/08/2018 patient updated at bedside Disposition: ICU  Labs   CBC: Recent Labs  Lab 08/02/18 1818 08/03/18 0016 08/04/18 0722 08/05/18 1173 08/06/18 0300 08/06/18 0709 08/07/18 0301  WBC 6.6 6.4 5.3 5.4 4.9  --  7.0  NEUTROABS 5.2 5.1  --  4.4 3.9  --  6.0  HGB 9.6* 9.4* 8.3* 8.9* 8.5* 8.7* 9.7*  HCT 29.9* 28.1* 25.3* 26.5* 26.3* 27.0* 29.7*  MCV 92.9 89.5 91.0 89.8 91.3  --  91.4  PLT 132* 120* 107* 110* 106*  --  144*    Basic Metabolic Panel: Recent Labs  Lab 08/04/18 0722 08/04/18 1427 08/05/18 0638 08/06/18 0300 08/07/18 0301 08/08/18 0451  NA 135  --  136 136 138 136  K 2.6* 3.5 3.5 3.5 3.7 3.5  CL 111  --  111 107 111 107  CO2 17*  --  16* 15* 12* 18*  GLUCOSE 128*  --  84 72 154* 120*  BUN <5*  --  <5* <5* <5* <5*  CREATININE 0.45  --  0.45 0.60 0.60 0.43*  CALCIUM 7.5*  --  7.8* 7.7* 8.5* 8.1*  MG 1.6*  --  1.7  --  1.6* 1.8  PHOS  --   --   --   --  3.4 3.7   GFR: Estimated Creatinine Clearance: 86.3 mL/min (A) (by C-G formula based on SCr of 0.43 mg/dL (L)). Recent Labs  Lab 08/02/18 2126  08/04/18 0722 08/05/18 0638 08/05/18 2010 08/05/18 2015 08/06/18 0300 08/07/18 0301  PROCALCITON  --   --   --   --  0.20  --  0.22 0.16  WBC  --    < > 5.3 5.4  --   --  4.9 7.0  LATICACIDVEN 0.8  --   --   --   --  0.7 0.5  --    < > = values in this interval not displayed.    Liver Function Tests: Recent Labs  Lab 08/02/18 1818 08/03/18 0016 08/05/18 5670 08/06/18 0300  AST 25 25 25 26   ALT 21 18 17 18   ALKPHOS 70 63 71 76  BILITOT 0.4 0.6 0.5 0.4  PROT 6.2*  5.7* 5.7* 5.6*  ALBUMIN 2.9* 2.6* 2.5* 2.3*   No results for input(s): LIPASE, AMYLASE in the last 168 hours. No results for input(s): AMMONIA in the last 168 hours.  ABG    Component Value Date/Time   PHART 7.400 08/05/2018 1720   PCO2ART 27.9 (L) 08/05/2018 1720   PO2ART 109 (H) 08/05/2018 1720   HCO3 16.8 (L) 08/05/2018 1720   ACIDBASEDEF 7.0 (H) 08/05/2018 1720   O2SAT 97.7 08/05/2018 1720     Coagulation Profile: No results for input(s): INR, PROTIME in the last 168 hours.  Cardiac Enzymes: Recent Labs  Lab 08/05/18 2010 08/06/18 0300  TROPONINI <0.03 <0.03    HbA1C: No results found for: HGBA1C  CBG: Recent Labs  Lab 08/07/18 1314 08/07/18 1610 08/07/18 1956 08/08/18 0007 08/08/18 0324  GLUCAP 98 170* 99 110* 116*     Critical care time: 30 min     Brett CanalesSteve Minor ACNP [facility] Pager [phone] till 1 pm If no answer page [phone] 08/08/2018, 9:06 AM

## 2018-08-08 NOTE — Progress Notes (Addendum)
FACULTY PRACTICE ANTEPARTUM PROGRESS NOTE  Alicia Riley is a 38 y.o. K8A0601 at [redacted]w[redacted]d who is admitted for respiratory status/COVID-19.  Estimated Date of Delivery: 10/09/18 Fetal presentation is unsure.  Length of Stay:  6 Days. Admitted 08/02/2018  Subjective: Patient decompensating this am down to 85%, back to 100% on rebreather but sats fall any time she removes mask. RN doing NST now, thus far, reactive. Patient denies complaints to RN.  Vitals:  Blood pressure 93/62, pulse 99, temperature 98.5 F (36.9 C), temperature source Oral, resp. rate (!) 26, height 5\' 3"  (1.6 m), weight 63.4 kg, last menstrual period 01/02/2018, SpO2 91 %, unknown if currently breastfeeding. Physical Examination: Done from outside room CONSTITUTIONAL: Well-developed, well-nourished female in mild distress. Appears to verbalize understanding of instructions, appears fatigued  ABDOMEN: gravid.  Not 20 min yet, but thus far, reactive Fetal monitoring: FHR: 150s bpm, Variability: moderate, Accelerations: Present, Decelerations: Absent  Uterine activity: no contractions per hour  I have reviewed the patient's current medications.  ASSESSMENT: Principal Problem:   COVID-19 Active Problems:   Language barrier affecting health care   Late prenatal care affecting pregnancy   Sepsis associated hypotension (HCC)   PLAN: COVID-19/respiratory distress - per primary team, please do not withhold preferred management due to pregnancy status, if questions, call OB team - please keep patient above 95% O2 for fetal benefit, if she falls < 95% for 5 minutes (or requires intubation), please call OB team [phone]) to do NST and alert OB attending - no limit for albuterol/steroids for maternal benefit, would prefer betamethasone if receiving steroids (for fetal benefit, 12 mg Q 24 x 2 doses)  - cont lovenox for DVT ppx  Fetal well being - twice daily NST by rapid response RN - maintain maternal oxygen saturation > 95% for  fetal benefit, if patient is persistently lower than this, please call OBGYN on call - no MgSO4 - NICU team aware, has attempted to consult  Mode of delivery - patient would require c-section if intubated and there is non-reassuring fetal status or it is felt that delivery would improve worsening maternal status, this has been reviewed with her and she has consented to c-section, otherwise, would prefer vaginal delivery  Pregnancy - daily prenatal vitamin when tolerating PO well - will need glucose tolerance testing, this can be done when maternal status is stable  Appreciate ICU/critical care team care.  For OB/GYN issues, please call the Center for [facility] Healthcare at [facility] Monday - Friday, 8 am - 5 pm: [phone] All other times: [phone]   K. Therese Sarah, M.D. Attending Center for [employer] (Faculty Practice)  08/08/2018 10:02 AM

## 2018-08-08 NOTE — Progress Notes (Signed)
Neonatal Medicine 08/08/2018 2:00 PM  Alicia Riley [id number]  Neonatology was asked to speak to this patient yesterday, as she has documented COVID-19 and with significant respiratory distress that required her admission to the ICU several days ago.  She is currently 31 1/7 weeks.  As of yesterday, she was not having contractions and from an obstetrics view was stable and not expected to deliver anytime soon.  I met with her ICU nurse last night, and made arrangements to remotely visit with the patient to describe our expectations for a 31+ week newborn, born to a COVID-19 positive mother.  Unfortunately I was called away for a premature delivery and admission, and by the time I returned to the medical ICU around 11 PM, found the patient had gone to sleep for the night.  As she was improving (her nurse said it was possible the patient could be moved out of the ICU today) and this did not appear to be an imminent delivery, I elected to wait until this morning to try again.  Unfortunately when I made remote contact with the patient this morning around 900, she was in quite of bit of respiratory distress and did not appear able to talk with me about her pregnancy.  This was an acute change from last night, and her nurse said she had contacted the attending physician for further evaluation.  Obviously this was not a good time for [place] to meet with the patient, so I asked nursing to contact us later when she is better.  One of our neonatologists is always present in the [place] (27/7), and will be able to talk with the patient when requested.  We can be reached via the NICU at [phone].  Thank you.  Roosevelt Locks, MD Neonatal Medicine

## 2018-08-08 NOTE — Progress Notes (Signed)
Pt repositioned to bedpan at 0750; with minimal exertion sats down to 85. Escalated O2 on [place] until 6 liters then placed on NRB 100%. Pt settled of bedpan, given much emotional support and relaxation coaching, pt remains slightly SOB with any exertion or effort. Steve Minor at bs, have also informed OB-GYN and sought further guidance on parameters of O2 sat r/t fetal well-being. Continue to monitor closely. Pt maintaining adequate sat on NRB.

## 2018-08-08 NOTE — Progress Notes (Signed)
At bedside monitoring FHR, pt in quite a bit of respiratory distress making it difficult to trace. Will continue to monitor and assess

## 2018-08-08 NOTE — Progress Notes (Signed)
Pt respiratory status much improved from this AM. Tolerated bath and linen change on 5L [place] and maintained sats well with repositioning. Pt appears more alert this afternoon as well.

## 2018-08-08 NOTE — Progress Notes (Signed)
Pt responds appropriately when interactions in [place]. With more detailed explanations of updates on condition assessment, interventions, and responses the translator has been utilized by MDs and RNs. Questions encouraged. Pt verbalizes understanding. Have explained, via translator, that her condition is more precarious this morning and warrants increased vigilance in our approach (ie. More restricted mobilization, NPO x meds and sips, more aggressive oxygen administration.) Much appreciate translators assistance.

## 2018-08-09 ENCOUNTER — Inpatient Hospital Stay (HOSPITAL_COMMUNITY): Payer: Medicaid Other

## 2018-08-09 DIAGNOSIS — R0902 Hypoxemia: Secondary | ICD-10-CM

## 2018-08-09 LAB — CBC WITH DIFFERENTIAL/PLATELET
Abs Immature Granulocytes: 0.13 10*3/uL — ABNORMAL HIGH (ref 0.00–0.07)
Basophils Absolute: 0 10*3/uL (ref 0.0–0.1)
Basophils Relative: 0 %
Eosinophils Absolute: 0 10*3/uL (ref 0.0–0.5)
Eosinophils Relative: 0 %
HCT: 30.1 % — ABNORMAL LOW (ref 36.0–46.0)
Hemoglobin: 10.3 g/dL — ABNORMAL LOW (ref 12.0–15.0)
Immature Granulocytes: 2 %
Lymphocytes Relative: 11 %
Lymphs Abs: 0.9 10*3/uL (ref 0.7–4.0)
MCH: 30 pg (ref 26.0–34.0)
MCHC: 34.2 g/dL (ref 30.0–36.0)
MCV: 87.8 fL (ref 80.0–100.0)
Monocytes Absolute: 0.4 10*3/uL (ref 0.1–1.0)
Monocytes Relative: 5 %
Neutro Abs: 6.7 10*3/uL (ref 1.7–7.7)
Neutrophils Relative %: 82 %
Platelets: 161 10*3/uL (ref 150–400)
RBC: 3.43 MIL/uL — ABNORMAL LOW (ref 3.87–5.11)
RDW: 14.1 % (ref 11.5–15.5)
WBC: 8.1 10*3/uL (ref 4.0–10.5)
nRBC: 0 % (ref 0.0–0.2)

## 2018-08-09 LAB — COMPREHENSIVE METABOLIC PANEL
ALT: 36 U/L (ref 0–44)
AST: 53 U/L — ABNORMAL HIGH (ref 15–41)
Albumin: 2.8 g/dL — ABNORMAL LOW (ref 3.5–5.0)
Alkaline Phosphatase: 113 U/L (ref 38–126)
Anion gap: 16 — ABNORMAL HIGH (ref 5–15)
BUN: 12 mg/dL (ref 6–20)
CO2: 20 mmol/L — ABNORMAL LOW (ref 22–32)
Calcium: 8.5 mg/dL — ABNORMAL LOW (ref 8.9–10.3)
Chloride: 101 mmol/L (ref 98–111)
Creatinine, Ser: 0.51 mg/dL (ref 0.44–1.00)
GFR calc Af Amer: >60 mL/min (ref >60)
GFR calc non Af Amer: >60 mL/min (ref >60)
Glucose, Bld: 113 mg/dL — ABNORMAL HIGH (ref 70–99)
Potassium: 3.4 mmol/L — ABNORMAL LOW (ref 3.5–5.1)
Sodium: 137 mmol/L (ref 135–145)
Total Bilirubin: 0.6 mg/dL (ref 0.3–1.2)
Total Protein: 6.8 g/dL (ref 6.5–8.1)

## 2018-08-09 LAB — POCT I-STAT 7, (LYTES, BLD GAS, ICA,H+H)
Acid-base deficit: 2 mmol/L (ref 0.0–2.0)
Bicarbonate: 21 mmol/L (ref 20.0–28.0)
Calcium, Ion: 1.13 mmol/L — ABNORMAL LOW (ref 1.15–1.40)
HCT: 31 % — ABNORMAL LOW (ref 36.0–46.0)
Hemoglobin: 10.5 g/dL — ABNORMAL LOW (ref 12.0–15.0)
O2 Saturation: 99 %
Patient temperature: 97.3
Potassium: 3.5 mmol/L (ref 3.5–5.1)
Sodium: 138 mmol/L (ref 135–145)
TCO2: 22 mmol/L (ref 22–32)
pCO2 arterial: 30 mmHg — ABNORMAL LOW (ref 32.0–48.0)
pH, Arterial: 7.45 (ref 7.350–7.450)
pO2, Arterial: 141 mmHg — ABNORMAL HIGH (ref 83.0–108.0)

## 2018-08-09 LAB — PHOSPHORUS: Phosphorus: 4.4 mg/dL (ref 2.5–4.6)

## 2018-08-09 LAB — C-REACTIVE PROTEIN: CRP: 8 mg/dL — ABNORMAL HIGH (ref <1.0)

## 2018-08-09 LAB — GLUCOSE, CAPILLARY
Glucose-Capillary: 100 mg/dL — ABNORMAL HIGH (ref 70–99)
Glucose-Capillary: 101 mg/dL — ABNORMAL HIGH (ref 70–99)
Glucose-Capillary: 103 mg/dL — ABNORMAL HIGH (ref 70–99)
Glucose-Capillary: 106 mg/dL — ABNORMAL HIGH (ref 70–99)
Glucose-Capillary: 109 mg/dL — ABNORMAL HIGH (ref 70–99)
Glucose-Capillary: 119 mg/dL — ABNORMAL HIGH (ref 70–99)
Glucose-Capillary: 125 mg/dL — ABNORMAL HIGH (ref 70–99)

## 2018-08-09 LAB — MAGNESIUM: Magnesium: 1.9 mg/dL (ref 1.7–2.4)

## 2018-08-09 LAB — D-DIMER, QUANTITATIVE: D-Dimer, Quant: 0.8 ug/mL-FEU — ABNORMAL HIGH (ref 0.00–0.50)

## 2018-08-09 LAB — FERRITIN: Ferritin: 40 ng/mL (ref 11–307)

## 2018-08-09 LAB — LACTATE DEHYDROGENASE: LDH: 174 U/L (ref 98–192)

## 2018-08-09 MED ORDER — SODIUM CHLORIDE 0.9 % IV SOLN
200.0000 mg | Freq: Once | INTRAVENOUS | Status: AC
  Administered 2018-08-09: 200 mg via INTRAVENOUS
  Filled 2018-08-09: qty 40

## 2018-08-09 MED ORDER — POTASSIUM CHLORIDE 20 MEQ/15ML (10%) PO SOLN: 20.0000 meq | ORAL | Status: DC

## 2018-08-09 MED ORDER — POTASSIUM CHLORIDE 20 MEQ/15ML (10%) PO SOLN
20.0000 meq | ORAL | Status: AC
  Administered 2018-08-09 (×2): 20 meq via ORAL
  Filled 2018-08-09 (×2): qty 15

## 2018-08-09 MED ORDER — SODIUM CHLORIDE 0.9 % IV SOLN
100.0000 mg | INTRAVENOUS | Status: DC
  Administered 2018-08-10 – 2018-08-13 (×4): 100 mg via INTRAVENOUS
  Filled 2018-08-09 (×4): qty 20

## 2018-08-09 MED ORDER — MAGNESIUM SULFATE 2 GM/50ML IV SOLN
2.0000 g | Freq: Once | INTRAVENOUS | Status: AC
  Administered 2018-08-09: 2 g via INTRAVENOUS
  Filled 2018-08-09: qty 50

## 2018-08-09 MED ORDER — POTASSIUM CHLORIDE CRYS ER 20 MEQ PO TBCR
40.0000 meq | EXTENDED_RELEASE_TABLET | Freq: Three times a day (TID) | ORAL | Status: AC
  Administered 2018-08-09 (×2): 40 meq via ORAL
  Filled 2018-08-09 (×2): qty 2

## 2018-08-09 NOTE — Progress Notes (Signed)
Pt c/o lower abdominal pain.  OB RR RN called and came to floor to assess and monitor.

## 2018-08-09 NOTE — Progress Notes (Signed)
Pt noted to be de-sating and c/o pain in lower ABD with cough.  Pt placed back on 5l [place] in addition to NRB already in place.  Place crying out in pain when coughing.  [employer] called and cameraed in to help assess.  Pt saturation improved with additional O2.  Pain given PRN guaifenesin and scheduled Tylenol.  Will continue to monitor patient closely.

## 2018-08-09 NOTE — Progress Notes (Signed)
Patient ID: Alicia Riley, female   DOB: 1980/11/15, 38 y.o.   MRN: [id number] ACULTY PRACTICE ANTEPARTUM COMPREHENSIVE PROGRESS NOTE  Alicia Riley is a 38 y.o. [id number] at 3942w2d  who is admitted for respiratory difficulties d/t Covid 19 in setting of IUP.Marland Kitchen.   Fetal presentation is cephalic. Length of Stay:  7  Days  Subjective: Pt doing much better. Has responded to IV lasix. O2 requirement is down to 5 L [place]. + FM. No Ut ctx, VB or LOF.  Vitals:  Blood pressure 91/63, pulse 90, temperature 97.7 F (36.5 C), temperature source Oral, resp. rate (!) 21, height 5\' 3"  (1.6 m), weight 63.4 kg, last menstrual period 01/02/2018, SpO2 96 %, unknown if currently breastfeeding.   Physical Examination: Deferred  Fetal Monitoring: Reactive NST   Labs:  Results for orders placed or performed during the hospital encounter of 08/02/18 (from the past 24 hour(s))  Glucose, capillary   Collection Time: 08/08/18  5:50 PM  Result Value Ref Range   Glucose-Capillary 113 (H) 70 - 99 mg/dL  Glucose, capillary   Collection Time: 08/08/18  7:51 PM  Result Value Ref Range   Glucose-Capillary 107 (H) 70 - 99 mg/dL  I-STAT 7, (LYTES, BLD GAS, ICA, H+H)   Collection Time: 08/08/18 10:39 PM  Result Value Ref Range   pH, Arterial 7.416 7.350 - 7.450   pCO2 arterial 31.1 (L) 32.0 - 48.0 mmHg   pO2, Arterial 78.0 (L) 83.0 - 108.0 mmHg   Bicarbonate 20.0 20.0 - 28.0 mmol/L   TCO2 21 (L) 22 - 32 mmol/L   O2 Saturation 96.0 %   Acid-base deficit 4.0 (H) 0.0 - 2.0 mmol/L   Sodium 136 135 - 145 mmol/L   Potassium 3.4 (L) 3.5 - 5.1 mmol/L   Calcium, Ion 1.20 1.15 - 1.40 mmol/L   HCT 29.0 (L) 36.0 - 46.0 %   Hemoglobin 9.9 (L) 12.0 - 15.0 g/dL   Patient temperature 08.698.6 F    Collection site RADIAL, ALLEN'S TEST ACCEPTABLE    Drawn by Operator    Sample type ARTERIAL   Glucose, capillary   Collection Time: 08/08/18 11:44 PM  Result Value Ref Range   Glucose-Capillary 100 (H) 70 - 99 mg/dL  I-STAT 7, (LYTES, BLD GAS,  ICA, H+H)   Collection Time: 08/09/18  2:06 AM  Result Value Ref Range   pH, Arterial 7.450 7.350 - 7.450   pCO2 arterial 30.0 (L) 32.0 - 48.0 mmHg   pO2, Arterial 141.0 (H) 83.0 - 108.0 mmHg   Bicarbonate 21.0 20.0 - 28.0 mmol/L   TCO2 22 22 - 32 mmol/L   O2 Saturation 99.0 %   Acid-base deficit 2.0 0.0 - 2.0 mmol/L   Sodium 138 135 - 145 mmol/L   Potassium 3.5 3.5 - 5.1 mmol/L   Calcium, Ion 1.13 (L) 1.15 - 1.40 mmol/L   HCT 31.0 (L) 36.0 - 46.0 %   Hemoglobin 10.5 (L) 12.0 - 15.0 g/dL   Patient temperature 57.897.3 F    Collection site RADIAL, ALLEN'S TEST ACCEPTABLE    Drawn by Operator    Sample type ARTERIAL   CBC with Differential/Platelet   Collection Time: 08/09/18  3:42 AM  Result Value Ref Range   WBC 8.1 4.0 - 10.5 K/uL   RBC 3.43 (L) 3.87 - 5.11 MIL/uL   Hemoglobin 10.3 (L) 12.0 - 15.0 g/dL   HCT 46.930.1 (L) 62.936.0 - 52.846.0 %   MCV 87.8 80.0 - 100.0 fL   MCH 30.0 26.0 -  34.0 pg   MCHC 34.2 30.0 - 36.0 g/dL   RDW 59.4 58.5 - 92.9 %   Platelets 161 150 - 400 K/uL   nRBC 0.0 0.0 - 0.2 %   Neutrophils Relative % 82 %   Neutro Abs 6.7 1.7 - 7.7 K/uL   Lymphocytes Relative 11 %   Lymphs Abs 0.9 0.7 - 4.0 K/uL   Monocytes Relative 5 %   Monocytes Absolute 0.4 0.1 - 1.0 K/uL   Eosinophils Relative 0 %   Eosinophils Absolute 0.0 0.0 - 0.5 K/uL   Basophils Relative 0 %   Basophils Absolute 0.0 0.0 - 0.1 K/uL   Immature Granulocytes 2 %   Abs Immature Granulocytes 0.13 (H) 0.00 - 0.07 K/uL  Magnesium   Collection Time: 08/09/18  3:42 AM  Result Value Ref Range   Magnesium 1.9 1.7 - 2.4 mg/dL  Phosphorus   Collection Time: 08/09/18  3:42 AM  Result Value Ref Range   Phosphorus 4.4 2.5 - 4.6 mg/dL  Ferritin   Collection Time: 08/09/18  3:42 AM  Result Value Ref Range   Ferritin 40 11 - 307 ng/mL  C-reactive protein   Collection Time: 08/09/18  3:42 AM  Result Value Ref Range   CRP 8.0 (H) <1.0 mg/dL  D-dimer, quantitative (not at [facility])   Collection Time: 08/09/18   3:42 AM  Result Value Ref Range   D-Dimer, Quant 0.80 (H) 0.00 - 0.50 ug/mL-FEU  Lactate dehydrogenase   Collection Time: 08/09/18  3:42 AM  Result Value Ref Range   LDH 174 98 - 192 U/L  Comprehensive metabolic panel   Collection Time: 08/09/18  3:42 AM  Result Value Ref Range   Sodium 137 135 - 145 mmol/L   Potassium 3.4 (L) 3.5 - 5.1 mmol/L   Chloride 101 98 - 111 mmol/L   CO2 20 (L) 22 - 32 mmol/L   Glucose, Bld 113 (H) 70 - 99 mg/dL   BUN 12 6 - 20 mg/dL   Creatinine, Ser 2.44 0.44 - 1.00 mg/dL   Calcium 8.5 (L) 8.9 - 10.3 mg/dL   Total Protein 6.8 6.5 - 8.1 g/dL   Albumin 2.8 (L) 3.5 - 5.0 g/dL   AST 53 (H) 15 - 41 U/L   ALT 36 0 - 44 U/L   Alkaline Phosphatase 113 38 - 126 U/L   Total Bilirubin 0.6 0.3 - 1.2 mg/dL   GFR calc non Af Amer >60 >60 mL/min   GFR calc Af Amer >60 >60 mL/min   Anion gap 16 (H) 5 - 15  Glucose, capillary   Collection Time: 08/09/18  4:21 AM  Result Value Ref Range   Glucose-Capillary 101 (H) 70 - 99 mg/dL  Glucose, capillary   Collection Time: 08/09/18  8:09 AM  Result Value Ref Range   Glucose-Capillary 109 (H) 70 - 99 mg/dL    Imaging Studies:    NA   Medications:  Scheduled . acetaminophen  650 mg Oral Q8H  . Chlorhexidine Gluconate Cloth  6 each Topical Daily  . enoxaparin (LOVENOX) injection  40 mg Subcutaneous Q12H  . insulin aspart  3-9 Units Subcutaneous Q4H  . methylPREDNISolone (SOLU-MEDROL) injection  40 mg Intravenous Q8H  . potassium chloride  40 mEq Oral TID  . sodium chloride flush  3 mL Intravenous Q12H   I have reviewed the patient's current medications.  ASSESSMENT: Patient Active Problem List   Diagnosis Date Noted  . Acute respiratory failure with hypoxia (HCC)   .  [redacted] weeks gestation of pregnancy   . Sepsis associated hypotension (HCC)   . Pneumonia due to COVID-19 virus 08/02/2018  . Supervision of other normal pregnancy, antepartum 05/29/2018  . Late prenatal care affecting pregnancy 05/29/2018  .  Language barrier affecting health care 05/11/2013  . Cervical polyp 05/11/2013    PLAN: Much improved from earlier today. Pulmonary care and management as per Pulmonary. As previously document by Dr Emelda Fear would proceed toward delivery via c section only if maternal status would require intubation. Will continue to follow with you.   Call [phone] for any questions   Hermina Staggers 08/09/2018,4:47 PM

## 2018-08-09 NOTE — Progress Notes (Signed)
1 uc noted in .  Pt slpet through UC.  Dr. Emelda Fear noted. No additional orders given.

## 2018-08-09 NOTE — Progress Notes (Signed)
FPTS Interim Progress Note  S: Patient remains under care of ICU with OB following. Requiring 5L . Appreciate their care for patient and recs. We will follow closely and resume care when patient no longer requires ICU level care.  O: BP 94/67   Pulse 73   Temp (!) 97.4 F (36.3 C) (Axillary)   Resp (!) 29   Ht 5\' 3"  (1.6 m)   Wt 63.4 kg   LMP 01/02/2018 (Exact Date)   SpO2 96%   BMI 24.76 kg/m     A/P: - Cont to follow patient status and chart review. - Will be happy to resume care once she is floor status.  Arlyce Harman, DO 08/09/2018, 8:09 AM PGY-2, [facility] Family Medicine Service pager [phone]

## 2018-08-09 NOTE — Progress Notes (Signed)
At 2130 pt noted to have a drop in O2 saturation 87-92%.  Called OB RR nurse to discuss. OB RR on the way to unit to monitor baby.  Per OB RR okay to advance O2 delivery to maintain O2 saturation >95%.  Pt O2 increased to 5L [place] with no significant change in status.  Pt placed on 100% NRB with saturations only increasing to 96%.  Pt very restless and moaning.  Pt denies pain with exception to her throat hurting.   Dr. Carlota Raspberry on floor to assess.  ABG ordered and reviewed.  Pt place on 5L [place] with NRB.  O2 saturations noted to increase to 99%.  Received order to give 20mg  IV lasix.  Will give lasix and continue to monitor very closely.

## 2018-08-09 NOTE — Progress Notes (Signed)
Dr. Alysia Penna notified of reactive NST.  No contractions, vaginal bleeding or LOF.

## 2018-08-09 NOTE — Progress Notes (Signed)
FACULTY PRACTICE ANTEPARTUM(COMPREHENSIVE) NOTE  Alicia Riley is a 38 y.o. [id number] at [redacted]w[redacted]d  who is admitted for respiratory difficulties due to Covid 19. .   Fetal presentation is cephalic. Length of Stay:  7  Days  Subjective: Pt has had a difficult 24 hours with progression of oxygen requirements. Currently she is on 15  L/ m with rebreather plus nasal cannula and has recently received lasix to insure she is not fluid overloaded., as directed by pulmonary team. Consideration being given to intubation, in the OR setting, for safety , if pt has not improved by 2 a.m Case has been collectively discussed with anesthesiology, Pulmonary critical care, Maternal Fetal Medicine, and OB OR teams.  MFM Dr Alicia Riley states survival rates are very good at this gestational age, after steroids, and there should be a low threshold for delivery. Patient reports the fetal movement as active. Patient reports uterine contraction  activity as none. Patient reports  vaginal bleeding as none. Patient describes fluid per vagina as None.  Vitals:  Blood pressure 105/77, pulse 85, temperature 97.7 F (36.5 C), temperature source Oral, resp. rate (!) 29, height 5\' 3"  (1.6 m), weight 63.4 kg, last menstrual period 01/02/2018, SpO2 95 %, unknown if currently breastfeeding. Physical Examination:  General appearance - normal appearing weight, ill-appearing and chronically ill appearing Heart - normal rate and regular rhythm Abdomen - soft, nontender, nondistended Fundal Height:  size equals dates Cervical Exam: Not evaluated. Extremities: extremities normal, atraumatic, no cyanosis or edema and Homans sign is negative, no sign of DVT with DTRs 2+ bilaterally Membranes:intact  Fetal Monitoring:  no decelerations on recent fetal monitoring at 10 pm.  Labs:  Results for orders placed or performed during the hospital encounter of 08/02/18 (from the past 24 hour(s))  Glucose, capillary   Collection Time: 08/08/18  3:24 AM   Result Value Ref Range   Glucose-Capillary 116 (H) 70 - 99 mg/dL  Basic metabolic panel   Collection Time: 08/08/18  4:51 AM  Result Value Ref Range   Sodium 136 135 - 145 mmol/L   Potassium 3.5 3.5 - 5.1 mmol/L   Chloride 107 98 - 111 mmol/L   CO2 18 (L) 22 - 32 mmol/L   Glucose, Bld 120 (H) 70 - 99 mg/dL   BUN <5 (L) 6 - 20 mg/dL   Creatinine, Ser 1.85 (L) 0.44 - 1.00 mg/dL   Calcium 8.1 (L) 8.9 - 10.3 mg/dL   GFR calc non Af Amer >60 >60 mL/min   GFR calc Af Amer >60 >60 mL/min   Anion gap 11 5 - 15  Magnesium   Collection Time: 08/08/18  4:51 AM  Result Value Ref Range   Magnesium 1.8 1.7 - 2.4 mg/dL  Phosphorus   Collection Time: 08/08/18  4:51 AM  Result Value Ref Range   Phosphorus 3.7 2.5 - 4.6 mg/dL  D-dimer, quantitative (not at [facility])   Collection Time: 08/08/18  4:51 AM  Result Value Ref Range   D-Dimer, Quant 0.80 (H) 0.00 - 0.50 ug/mL-FEU  Glucose, capillary   Collection Time: 08/08/18  8:39 AM  Result Value Ref Range   Glucose-Capillary 95 70 - 99 mg/dL  Glucose, capillary   Collection Time: 08/08/18 12:03 PM  Result Value Ref Range   Glucose-Capillary 107 (H) 70 - 99 mg/dL  Glucose, capillary   Collection Time: 08/08/18  5:50 PM  Result Value Ref Range   Glucose-Capillary 113 (H) 70 - 99 mg/dL  Glucose, capillary   Collection Time:  08/08/18  7:51 PM  Result Value Ref Range   Glucose-Capillary 107 (H) 70 - 99 mg/dL  I-STAT 7, (LYTES, BLD GAS, ICA, H+H)   Collection Time: 08/08/18 10:39 PM  Result Value Ref Range   pH, Arterial 7.416 7.350 - 7.450   pCO2 arterial 31.1 (L) 32.0 - 48.0 mmHg   pO2, Arterial 78.0 (L) 83.0 - 108.0 mmHg   Bicarbonate 20.0 20.0 - 28.0 mmol/L   TCO2 21 (L) 22 - 32 mmol/L   O2 Saturation 96.0 %   Acid-base deficit 4.0 (H) 0.0 - 2.0 mmol/L   Sodium 136 135 - 145 mmol/L   Potassium 3.4 (L) 3.5 - 5.1 mmol/L   Calcium, Ion 1.20 1.15 - 1.40 mmol/L   HCT 29.0 (L) 36.0 - 46.0 %   Hemoglobin 9.9 (L) 12.0 - 15.0 g/dL   Patient  temperature 98.6 F    Collection site RADIAL, ALLEN'S TEST ACCEPTABLE    Drawn by Operator    Sample type ARTERIAL     Imaging Studies:      Medications:  Scheduled . acetaminophen  650 mg Oral Q8H  . Chlorhexidine Gluconate Cloth  6 each Topical Daily  . enoxaparin (LOVENOX) injection  40 mg Subcutaneous Q12H  . insulin aspart  3-9 Units Subcutaneous Q4H  . methylPREDNISolone (SOLU-MEDROL) injection  40 mg Intravenous Q8H  . prenatal multivitamin  1 tablet Oral Q1200  . sodium chloride flush  3 mL Intravenous Q12H   I have reviewed the patient's current medications.  ASSESSMENT: Patient Active Problem List   Diagnosis Date Noted  . Acute respiratory failure with hypoxia (HCC)   . [redacted] weeks gestation of pregnancy   . Sepsis associated hypotension (HCC)   . Pneumonia due to COVID-19 virus 08/02/2018  . Supervision of other normal pregnancy, antepartum 05/29/2018  . Late prenatal care affecting pregnancy 05/29/2018  . Language barrier affecting health care 05/11/2013  . Cervical polyp 05/11/2013    PLAN: In this high risk situation with uncertain prognosis for maternal response to intubation, I feel benefits of delivery in face of intubation outweigh risks.  Current plan is to await decision by Pulmonary care at 2 am re: intubation, and if condition fails to respond to current interventions and intubation reqired, will recommend proceding with delivery at that time by cesarean section. Pt has previously consented to cesarean delivery, and that will be updated if intubation and delivery become necessary.  Tilda BurrowJohn V Ezzard Riley 08/09/2018,12:47 AM    Patient ID: Alicia Riley, female   DOB: 10-21-1980, 38 y.o.   MRN: [id number]

## 2018-08-09 NOTE — Progress Notes (Signed)
Video call completed with sister. Patient responding to me and sister on camera. Breathing much better today!

## 2018-08-09 NOTE — Progress Notes (Signed)
PCCM UPDATE   After Diuresis w/ 20mg  Lasix IV and placement of indwelling Foley catheter Pt has made >1L UOP O2 sats 98% on [place] and NRB  Repeat ABG shows improvement in PF ratio 7.45/30/141/21 RR 25-32  Pt appears more comfortable. Will attempt to wean pt back down to 5L [place] as tolerated. O2 sat goal>95%   Signed Dr Newell Coral Pulmonary Critical Care Locums

## 2018-08-09 NOTE — Progress Notes (Signed)
Dr. Alysia Penna notified that pt had a reactive NST. No vaginal bleeding or leaking of fluid.

## 2018-08-09 NOTE — Progress Notes (Signed)
Received phone call from pt's primary rn. Pt c/o of abdominal pain after insertion of foley cathter. Not sure if pain is due to foley catheter or contraction. Pt not sure if feeling abdominal pain constantly or intermittent. No UC palpated. Toco applied and monitoring for UC. None seen so far. Will continue to assess

## 2018-08-09 NOTE — Progress Notes (Signed)
NAMTamela Riley:  Alicia Riley, MRN:  [id number], DOB:  1980-12-11, LOS: 7 ADMISSION DATE:  08/02/2018, CONSULTATION DATE:  08/05/18 REFERRING MD:  Dr. Jennette KettleNeal , CHIEF COMPLAINT:  COVID-19 infection  Brief History   38 year old female who is 30 +[redacted] weeks pregnant admitted with respiratory symptoms and was found to be positive for Novel Coronavirus. She began to decompensate requiring supplemental oxygen and PCCM was consulted.   History of present illness   38 year old [place] female (does not speak [place]) who is G4P2 and actively pregnant at [redacted] weeks and 2 days at time of presentation. [facility] with complaints of SOB, cough, and fever. Symptoms started 5/8. She did not require supplemental oxygen, but she was initially hypotensive, responded well to IVF resuscitation. Then overnight 5/11 >5/12 she developed worsening tachypnea and tachycardia. Fetal heart tones also elevated to 180 (expected 140). Throughout the day she became diaphoretic and hypoxic requiring up to 3L of supplemental oxygen via nasal cannula. For this reason PCCM was asked to see in consultation.   Past Medical History  G4P2  Significant Hospital Events   5/9 admit 5/12 transfer to ICu for worsening hypoxia.   Consults:  OB  Procedures:    Significant Diagnostic Tests:    Micro Data:  5/9 SARS Coronavirus 2 > positive 5/12 Blood culture >>>  Antimicrobials:  Azithromycin 5/12 > Ceftraixone 5/12 >  Interim history/subjective:  No events overnight, no new complaints  Objective   Blood pressure 97/63, pulse 66, temperature 98.1 F (36.7 C), temperature source Oral, resp. rate (!) 25, height 5\' 3"  (1.6 m), weight 63.4 kg, last menstrual period 01/02/2018, SpO2 98 %, unknown if currently breastfeeding.     Intake/Output Summary (Last 24 hours) at 08/09/2018 1131 Last data filed at 08/09/2018 1100 Gross per 24 hour  Intake 870.04 ml  Output 2100 ml  Net -1229.96 ml   Filed Weights   08/03/18  0006 08/05/18 1800  Weight: 65.1 kg 63.4 kg    Examination: General: 38 year old female no respiratory distress HEENT: [place]/AT, PERRL, EOM-I and MMM Neuro: Alert and interactive, moving all ext to command CV: RRR, Nl S1/S2 and -M/R/G PULM: CTA bilaterally GI: Soft, NT, ND and +BS extremities: warm/dry, negative edema  Skin: no rashes or lesions  I reviewed CXR myself, bibasilar infiltrate  Resolved Hospital Problem list     Assessment & Plan:   COVID-19 infection: - Hold in the ICU concern for respiratory failure, remains on 100% NRB - IS and flutter valve - BD as ordered via MDI - Hold further diureses - K-dur as ordered - BMET in AM - F/U on culture - Currently on Rocephin and Zithromax empirical treatment for questionable pneumonia - Questionable use of convalescent plasma exchange, will hold off for now - Position changes as tolerated - Continue Solu-Medrol 40 mg every 8 hours would not decrease dose at this time due to worsening respiratory failure - Continue low molecular weight heparin at twice daily dosing  Sepsis: secondary to above  Steroid induced hyperglycemia  CBG (last 3)  Recent Labs    08/08/18 2344 08/09/18 0421 08/09/18 0809  GLUCAP 100* 101* 109*    -Sliding scale insulin protocol   Active pregnancy: -OB is following  Some question she may be a candidate for convalescent plasma transfusion.  This will be complicated by language barrier.  Per MD.  She may be a candidate for convalescent plasma transfusion, however, language barrier will complicate this.  Have discussed with Dr. Marchelle Gearing who has conferred with [facility]. Seeing as there is no [place] consent form, we could use the english form if it was read verbatim to patient/husband via interpreter.  Best practice:  Diet: Regular  Pain/Anxiety/Delirium protocol (if indicated): N/a VAP protocol (if indicated): N/a DVT prophylaxis: lovenox pending d-dimer GI prophylaxis: n/a Glucose  control: n/a Mobility: BR Code Status: FULL Family Communication: 08/08/2018 patient updated at bedside Disposition: ICU  Labs   CBC: Recent Labs  Lab 08/03/18 0016 08/04/18 0722 08/05/18 0981 08/06/18 0300 08/06/18 0709 08/07/18 0301 08/08/18 2239 08/09/18 0206 08/09/18 0342  WBC 6.4 5.3 5.4 4.9  --  7.0  --   --  8.1  NEUTROABS 5.1  --  4.4 3.9  --  6.0  --   --  6.7  HGB 9.4* 8.3* 8.9* 8.5* 8.7* 9.7* 9.9* 10.5* 10.3*  HCT 28.1* 25.3* 26.5* 26.3* 27.0* 29.7* 29.0* 31.0* 30.1*  MCV 89.5 91.0 89.8 91.3  --  91.4  --   --  87.8  PLT 120* 107* 110* 106*  --  144*  --   --  161    Basic Metabolic Panel: Recent Labs  Lab 08/04/18 0722  08/05/18 0638 08/06/18 0300 08/07/18 0301 08/08/18 0451 08/08/18 2239 08/09/18 0206 08/09/18 0342  NA 135  --  136 136 138 136 136 138 137  K 2.6*   < > 3.5 3.5 3.7 3.5 3.4* 3.5 3.4*  CL 111  --  111 107 111 107  --   --  101  CO2 17*  --  16* 15* 12* 18*  --   --  20*  GLUCOSE 128*  --  84 72 154* 120*  --   --  113*  BUN <5*  --  <5* <5* <5* <5*  --   --  12  CREATININE 0.45  --  0.45 0.60 0.60 0.43*  --   --  0.51  CALCIUM 7.5*  --  7.8* 7.7* 8.5* 8.1*  --   --  8.5*  MG 1.6*  --  1.7  --  1.6* 1.8  --   --  1.9  PHOS  --   --   --   --  3.4 3.7  --   --  4.4   < > = values in this interval not displayed.   GFR: Estimated Creatinine Clearance: 86.3 mL/min (by C-G formula based on SCr of 0.51 mg/dL). Recent Labs  Lab 08/02/18 2126  08/05/18 0638 08/05/18 2010 08/05/18 2015 08/06/18 0300 08/07/18 0301 08/09/18 0342  PROCALCITON  --   --   --  0.20  --  0.22 0.16  --   WBC  --    < > 5.4  --   --  4.9 7.0 8.1  LATICACIDVEN 0.8  --   --   --  0.7 0.5  --   --    < > = values in this interval not displayed.    Liver Function Tests: Recent Labs  Lab 08/02/18 1818 08/03/18 0016 08/05/18 0638 08/06/18 0300 08/09/18 0342  AST 53*  ALT 36  ALKPHOS 70 63 71 76 113  BILITOT 0.4 0.6 0.5 0.4 0.6   PROT 6.2* 5.7* 5.7* 5.6* 6.8  ALBUMIN 2.9* 2.6* 2.5* 2.3* 2.8*   No results for input(s): LIPASE, AMYLASE in the last 168 hours. No results for input(s): AMMONIA in the last 168 hours.  ABG  Component Value Date/Time   PHART 7.450 08/09/2018 0206   PCO2ART 30.0 (L) 08/09/2018 0206   PO2ART 141.0 (H) 08/09/2018 0206   HCO3 21.0 08/09/2018 0206   TCO2 22 08/09/2018 0206   ACIDBASEDEF 2.0 08/09/2018 0206   O2SAT 99.0 08/09/2018 0206     Coagulation Profile: No results for input(s): INR, PROTIME in the last 168 hours.  Cardiac Enzymes: Recent Labs  Lab 08/05/18 2010 08/06/18 0300  TROPONINI <0.03 <0.03    HbA1C: No results found for: HGBA1C  CBG: Recent Labs  Lab 08/08/18 1750 08/08/18 1951 08/08/18 2344 08/09/18 0421 08/09/18 0809  GLUCAP 113* 107* 100* 101* 109*   The patient is critically ill with multiple organ systems failure and requires high complexity decision making for assessment and support, frequent evaluation and titration of therapies, application of advanced monitoring technologies and extensive interpretation of multiple databases.   Critical Care Time devoted to patient care services described in this note is  31  Minutes. This time reflects time of care of this signee Dr Koren Bound. This critical care time does not reflect procedure time, or teaching time or supervisory time of PA/NP/Med student/Med Resident etc but could involve care discussion time.  Alyson Reedy, M.D. [facility] Pulmonary/Critical Care Medicine. Pager: [phone]. After hours pager: [phone].

## 2018-08-10 DIAGNOSIS — J189 Pneumonia, unspecified organism: Secondary | ICD-10-CM

## 2018-08-10 LAB — COMPREHENSIVE METABOLIC PANEL
ALT: 38 U/L (ref 0–44)
AST: 45 U/L — ABNORMAL HIGH (ref 15–41)
Albumin: 2.7 g/dL — ABNORMAL LOW (ref 3.5–5.0)
Alkaline Phosphatase: 108 U/L (ref 38–126)
Anion gap: 13 (ref 5–15)
BUN: 11 mg/dL (ref 6–20)
CO2: 20 mmol/L — ABNORMAL LOW (ref 22–32)
Calcium: 8.6 mg/dL — ABNORMAL LOW (ref 8.9–10.3)
Chloride: 103 mmol/L (ref 98–111)
Creatinine, Ser: 0.41 mg/dL — ABNORMAL LOW (ref 0.44–1.00)
GFR calc Af Amer: >60 mL/min (ref >60)
GFR calc non Af Amer: >60 mL/min (ref >60)
Glucose, Bld: 110 mg/dL — ABNORMAL HIGH (ref 70–99)
Potassium: 4.1 mmol/L (ref 3.5–5.1)
Sodium: 136 mmol/L (ref 135–145)
Total Bilirubin: 0.8 mg/dL (ref 0.3–1.2)
Total Protein: 6.6 g/dL (ref 6.5–8.1)

## 2018-08-10 LAB — CBC
HCT: 30.2 % — ABNORMAL LOW (ref 36.0–46.0)
Hemoglobin: 9.9 g/dL — ABNORMAL LOW (ref 12.0–15.0)
MCH: 29.3 pg (ref 26.0–34.0)
MCHC: 32.8 g/dL (ref 30.0–36.0)
MCV: 89.3 fL (ref 80.0–100.0)
Platelets: 186 10*3/uL (ref 150–400)
RBC: 3.38 MIL/uL — ABNORMAL LOW (ref 3.87–5.11)
RDW: 13.9 % (ref 11.5–15.5)
WBC: 5.2 10*3/uL (ref 4.0–10.5)
nRBC: 0 % (ref 0.0–0.2)

## 2018-08-10 LAB — GLUCOSE, CAPILLARY
Glucose-Capillary: 108 mg/dL — ABNORMAL HIGH (ref 70–99)
Glucose-Capillary: 128 mg/dL — ABNORMAL HIGH (ref 70–99)
Glucose-Capillary: 120 mg/dL — ABNORMAL HIGH (ref 70–99)
Glucose-Capillary: 78 mg/dL (ref 70–99)
Glucose-Capillary: 129 mg/dL — ABNORMAL HIGH (ref 70–99)

## 2018-08-10 LAB — URINALYSIS, DIPSTICK ONLY
Bilirubin Urine: NEGATIVE
Glucose, UA: NEGATIVE mg/dL
Ketones, ur: 80 mg/dL — AB
Leukocytes,Ua: NEGATIVE
Nitrite: NEGATIVE
Protein, ur: NEGATIVE mg/dL
Specific Gravity, Urine: 1.017 (ref 1.005–1.030)
pH: 6 (ref 5.0–8.0)

## 2018-08-10 LAB — MAGNESIUM: Magnesium: 2.3 mg/dL (ref 1.7–2.4)

## 2018-08-10 LAB — CULTURE, BLOOD (ROUTINE X 2)
Culture: NO GROWTH
Culture: NO GROWTH
Special Requests: ADEQUATE
Special Requests: ADEQUATE

## 2018-08-10 LAB — PHOSPHORUS: Phosphorus: 3.6 mg/dL (ref 2.5–4.6)

## 2018-08-10 MED ORDER — METHYLPREDNISOLONE SODIUM SUCC 40 MG IJ SOLR
40.0000 mg | Freq: Three times a day (TID) | INTRAMUSCULAR | Status: DC
  Administered 2018-08-10 – 2018-08-11 (×4): 40 mg via INTRAVENOUS
  Filled 2018-08-10 (×5): qty 1

## 2018-08-10 MED ORDER — DOCUSATE SODIUM 100 MG PO CAPS
100.0000 mg | ORAL_CAPSULE | Freq: Two times a day (BID) | ORAL | Status: AC
  Administered 2018-08-10 – 2018-08-11 (×4): 100 mg via ORAL
  Filled 2018-08-10 (×4): qty 1

## 2018-08-10 MED ORDER — WHITE PETROLATUM EX OINT
TOPICAL_OINTMENT | CUTANEOUS | Status: AC
  Administered 2018-08-10: 0.2
  Filled 2018-08-10: qty 28.35

## 2018-08-10 MED ORDER — MAGNESIUM HYDROXIDE 400 MG/5ML PO SUSP
15.0000 mL | Freq: Once | ORAL | Status: AC
  Administered 2018-08-10: 15 mL via ORAL
  Filled 2018-08-10: qty 30

## 2018-08-10 NOTE — Progress Notes (Signed)
Pt is sitting in chair eating lunch.  VSS.  Sats 96% on 5L.  No obstetrical complaints at this time.  Abdomen soft and nontender.  No leaking or bleeding.  NST started.  Pt states she is very tired.

## 2018-08-10 NOTE — Progress Notes (Signed)
NAMTamela Riley:  Alicia Riley, MRN:  [id number], DOB:  1980/05/05, LOS: 8 ADMISSION DATE:  08/02/2018, CONSULTATION DATE:  08/05/18 REFERRING MD:  Dr. Jennette KettleNeal , CHIEF COMPLAINT:  COVID-19 infection  Brief History   38 year old female who is 30 +[redacted] weeks pregnant admitted with respiratory symptoms and was found to be positive for Novel Coronavirus. She began to decompensate requiring supplemental oxygen and PCCM was consulted.   History of present illness   38 year old [place] female (does not speak [place]) who is G4P2 and actively pregnant at [redacted] weeks and 2 days at time of presentation. [facility] with complaints of SOB, cough, and fever. Symptoms started 5/8. She did not require supplemental oxygen, but she was initially hypotensive, responded well to IVF resuscitation. Then overnight 5/11 >5/12 she developed worsening tachypnea and tachycardia. Fetal heart tones also elevated to 180 (expected 140). Throughout the day she became diaphoretic and hypoxic requiring up to 3L of supplemental oxygen via nasal cannula. For this reason PCCM was asked to see in consultation.   Past Medical History  G4P2  Significant Hospital Events   5/9 admit 5/12 transfer to ICu for worsening hypoxia.   Consults:  OB  Procedures:    Significant Diagnostic Tests:    Micro Data:  5/9 SARS Coronavirus 2 > positive 5/12 Blood culture >>>  Antimicrobials:  Azithromycin 5/12 >5/17 Ceftraixone 5/12 >5/19  Interim history/subjective:  No events overnight, no new complaints  Objective   Blood pressure 101/66, pulse 71, temperature 97.9 F (36.6 C), temperature source Oral, resp. rate (!) 22, height 5\' 3"  (1.6 m), weight 63.4 kg, last menstrual period 01/02/2018, SpO2 97 %, unknown if currently breastfeeding.     Intake/Output Summary (Last 24 hours) at 08/10/2018 1013 Last data filed at 08/10/2018 0900 Gross per 24 hour  Intake 824.11 ml  Output 1590 ml  Net -765.89 ml   Filed Weights   08/03/18 0006 08/05/18 1800  Weight: 65.1 kg 63.4 kg   Examination: General: Well appearing, NAD HEENT: [place]/AT, PERRL, EOM-I and MMM Neuro: Alert and interactive, moving all ext CV: RRR, Nl S1/S2 and -M/R/G PULM: CTA bilaterally GI: Soft, NT, ND and +BS extremities: -edema and -tenderness Skin: no rashes or lesions  I reviewed CXR myself, infiltrate improving  Desaturated when [facility]ulating [facility]L [facility]  Resolved Hospital Problem list     Assessment & Plan:   COVID-19 infection: - Hold in the ICU for further observation, concerned about significant desaturation when ambulating - IS and flutter valve - BD as ordered via MDI - Continue Remdesivir - Hold further diuretics - BMET in AM - Replace electrolytes as indicated - F/U on culture - Currently on Rocephin and Zithromax empirical treatment for questionable pneumonia, stop date in place as above - No need for convalescent plasma - Position changes as tolerated - Continue Solu-Medrol 40 mg every 8 hours would not decrease dose at this time due to worsening respiratory failure - Continue low molecular weight heparin at twice daily dosing  Sepsis: secondary to above  Steroid induced hyperglycemia  CBG (last 3)  Recent Labs    08/09/18 2345 08/10/18 0344 08/10/18 0750  GLUCAP 106* 108* 128*    -Sliding scale insulin protocol   Active pregnancy: -OB is following  Some question she may be a candidate for convalescent plasma transfusion.  This will be complicated by language barrier.  Per MD.  She may be a candidate for convalescent plasma transfusion, however,  language barrier will complicate this. Have discussed with Dr. Marchelle Gearing who has conferred with [facility]. Seeing as there is no Burmese consent form, we could use the english form if it was read verbatim to patient/husband via interpreter.  Best practice:  Diet: Regular  Pain/Anxiety/Delirium protocol (if indicated): N/a VAP protocol (if indicated): N/a DVT  prophylaxis: lovenox pending d-dimer GI prophylaxis: n/a Glucose control: n/a Mobility: BR Code Status: FULL Family Communication: 08/08/2018 patient updated at bedside Disposition: ICU  Labs   CBC: Recent Labs  Lab 08/05/18 0638 08/06/18 0300  08/07/18 0301 08/08/18 2239 08/09/18 0206 08/09/18 0342 08/10/18 0456  WBC 5.4 4.9  --  7.0  --   --  8.1 5.2  NEUTROABS 4.4 3.9  --  6.0  --   --  6.7  --   HGB 8.9* 8.5*   < > 9.7* 9.9* 10.5* 10.3* 9.9*  HCT 26.5* 26.3*   < > 29.7* 29.0* 31.0* 30.1* 30.2*  MCV 89.8 91.3  --  91.4  --   --  87.8 89.3  PLT 110* 106*  --  144*  --   --  161 186   < > = values in this interval not displayed.    Basic Metabolic Panel: Recent Labs  Lab 08/05/18 0638 08/06/18 0300 08/07/18 0301 08/08/18 0451 08/08/18 2239 08/09/18 0206 08/09/18 0342 08/10/18 0456  NA 136 136 138 136 136 138 137 136  K 3.5 3.5 3.7 3.5 3.4* 3.5 3.4* 4.1  CL 111 107 111 107  --   --  101 103  CO2 16* 15* 12* 18*  --   --  20* 20*  GLUCOSE 84 72 154* 120*  --   --  113* 110*  BUN <5* <5* <5* <5*  --   --  12 11  CREATININE 0.45 0.60 0.60 0.43*  --   --  0.51 0.41*  CALCIUM 7.8* 7.7* 8.5* 8.1*  --   --  8.5* 8.6*  MG 1.7  --  1.6* 1.8  --   --  1.9 2.3  PHOS  --   --  3.4 3.7  --   --  4.4 3.6   GFR: Estimated Creatinine Clearance: 86.3 mL/min (A) (by C-G formula based on SCr of 0.41 mg/dL (L)). Recent Labs  Lab 08/05/18 2010 08/05/18 2015 08/06/18 0300 08/07/18 0301 08/09/18 0342 08/10/18 0456  PROCALCITON 0.20  --  0.22 0.16  --   --   WBC  --   --  4.9 7.0 8.1 5.2  LATICACIDVEN  --  0.7 0.5  --   --   --     Liver Function Tests: Recent Labs  Lab 08/05/18 0638 08/06/18 0300 08/09/18 0342 08/10/18 0456  AST 25 26 53* 45*  ALT 17 18 36 38  ALKPHOS 71 76 113 108  BILITOT 0.5 0.4 0.6 0.8  PROT 5.7* 5.6* 6.8 6.6  ALBUMIN 2.5* 2.3* 2.8* 2.7*   No results for input(s): LIPASE, AMYLASE in the last 168 hours. No results for input(s): AMMONIA in  the last 168 hours.  ABG    Component Value Date/Time   PHART 7.450 08/09/2018 0206   PCO2ART 30.0 (L) 08/09/2018 0206   PO2ART 141.0 (H) 08/09/2018 0206   HCO3 21.0 08/09/2018 0206   TCO2 22 08/09/2018 0206   ACIDBASEDEF 2.0 08/09/2018 0206   O2SAT 99.0 08/09/2018 0206     Coagulation Profile: No results for input(s): INR, PROTIME in the last 168 hours.  Cardiac Enzymes:  Recent Labs  Lab 08/05/18 2010 08/06/18 0300  TROPONINI <0.03 <0.03    HbA1C: No results found for: HGBA1C  CBG: Recent Labs  Lab 08/09/18 1720 08/09/18 2021 08/09/18 2345 08/10/18 0344 08/10/18 0750  GLUCAP 103* 125* 106* 108* 128*   The patient is critically ill with multiple organ systems failure and requires high complexity decision making for assessment and support, frequent evaluation and titration of therapies, application of advanced monitoring technologies and extensive interpretation of multiple databases.   Critical Care Time devoted to patient care services described in this note is  31  Minutes. This time reflects time of care of this signee Dr Koren Bound. This critical care time does not reflect procedure time, or teaching time or supervisory time of PA/NP/Med student/Med Resident etc but could involve care discussion time.  Alyson Reedy, M.D. [facility] Pulmonary/Critical Care Medicine. Pager: [phone]. After hours pager: [phone].

## 2018-08-10 NOTE — Progress Notes (Signed)
NST completed. Reactive for geastational age.

## 2018-08-10 NOTE — Progress Notes (Signed)
FACULTY PRACTICE ANTEPARTUM PROGRESS NOTE  Alicia Riley is a 38 y.o. [id number] at 1157w3d who is admitted for respiratory distress/COVID-19.  Estimated Date of Delivery: 10/09/18  Length of Stay:  8 Days. Admitted 08/02/2018  Subjective: Patient seen at bedside, RN in room, per RN, patient feels baby moving, reports some minor pain in lower abdomen, no other obstetric complaints. Per RN, patient ambulated within room and felt okay  Vitals:  Blood pressure 101/66, pulse 71, temperature 97.9 F (36.6 C), temperature source Oral, resp. rate (!) 22, height 5\' 3"  (1.6 m), weight 63.4 kg, last menstrual period 01/02/2018, SpO2 97 %, unknown if currently breastfeeding. Physical Examination: CONSTITUTIONAL: Well-developed, well-nourished female in no mild distress, appears fatigued but is sitting up in chair ABDOMEN: gravid.  Not yet done today, from last pm Fetal monitoring: FHR: 135 bpm, Variability: moderate, Accelerations: Present, Decelerations: Absent  Uterine activity: no contractions per hour  Results for orders placed or performed during the hospital encounter of 08/02/18 (from the past 48 hour(s))  Glucose, capillary     Status: Abnormal   Collection Time: 08/08/18 12:03 PM  Result Value Ref Range   Glucose-Capillary 107 (H) 70 - 99 mg/dL  Glucose, capillary     Status: Abnormal   Collection Time: 08/08/18  5:50 PM  Result Value Ref Range   Glucose-Capillary 113 (H) 70 - 99 mg/dL  Glucose, capillary     Status: Abnormal   Collection Time: 08/08/18  7:51 PM  Result Value Ref Range   Glucose-Capillary 107 (H) 70 - 99 mg/dL  I-STAT 7, (LYTES, BLD GAS, ICA, H+H)     Status: Abnormal   Collection Time: 08/08/18 10:39 PM  Result Value Ref Range   pH, Arterial 7.416 7.350 - 7.450   pCO2 arterial 31.1 (L) 32.0 - 48.0 mmHg   pO2, Arterial 78.0 (L) 83.0 - 108.0 mmHg   Bicarbonate 20.0 20.0 - 28.0 mmol/L   TCO2 21 (L) 22 - 32 mmol/L   O2 Saturation 96.0 %   Acid-base deficit 4.0 (H) 0.0 - 2.0  mmol/L   Sodium 136 135 - 145 mmol/L   Potassium 3.4 (L) 3.5 - 5.1 mmol/L   Calcium, Ion 1.20 1.15 - 1.40 mmol/L   HCT 29.0 (L) 36.0 - 46.0 %   Hemoglobin 9.9 (L) 12.0 - 15.0 g/dL   Patient temperature 14.798.6 F    Collection site RADIAL, ALLEN'S TEST ACCEPTABLE    Drawn by Operator    Sample type ARTERIAL   Glucose, capillary     Status: Abnormal   Collection Time: 08/08/18 11:44 PM  Result Value Ref Range   Glucose-Capillary 100 (H) 70 - 99 mg/dL  I-STAT 7, (LYTES, BLD GAS, ICA, H+H)     Status: Abnormal   Collection Time: 08/09/18  2:06 AM  Result Value Ref Range   pH, Arterial 7.450 7.350 - 7.450   pCO2 arterial 30.0 (L) 32.0 - 48.0 mmHg   pO2, Arterial 141.0 (H) 83.0 - 108.0 mmHg   Bicarbonate 21.0 20.0 - 28.0 mmol/L   TCO2 22 22 - 32 mmol/L   O2 Saturation 99.0 %   Acid-base deficit 2.0 0.0 - 2.0 mmol/L   Sodium 138 135 - 145 mmol/L   Potassium 3.5 3.5 - 5.1 mmol/L   Calcium, Ion 1.13 (L) 1.15 - 1.40 mmol/L   HCT 31.0 (L) 36.0 - 46.0 %   Hemoglobin 10.5 (L) 12.0 - 15.0 g/dL   Patient temperature 82.997.3 F    Collection site RADIAL, ALLEN'S TEST  ACCEPTABLE    Drawn by Operator    Sample type ARTERIAL   CBC with Differential/Platelet     Status: Abnormal   Collection Time: 08/09/18  3:42 AM  Result Value Ref Range   WBC 8.1 4.0 - 10.5 K/uL   RBC 3.43 (L) 3.87 - 5.11 MIL/uL   Hemoglobin 10.3 (L) 12.0 - 15.0 g/dL   HCT 46.5 (L) 03.5 - 46.5 %   MCV 87.8 80.0 - 100.0 fL   MCH 30.0 26.0 - 34.0 pg   MCHC 34.2 30.0 - 36.0 g/dL   RDW 68.1 27.5 - 17.0 %   Platelets 161 150 - 400 K/uL   nRBC 0.0 0.0 - 0.2 %   Neutrophils Relative % 82 %   Neutro Abs 6.7 1.7 - 7.7 K/uL   Lymphocytes Relative 11 %   Lymphs Abs 0.9 0.7 - 4.0 K/uL   Monocytes Relative 5 %   Monocytes Absolute 0.4 0.1 - 1.0 K/uL   Eosinophils Relative 0 %   Eosinophils Absolute 0.0 0.0 - 0.5 K/uL   Basophils Relative 0 %   Basophils Absolute 0.0 0.0 - 0.1 K/uL   Immature Granulocytes 2 %   Abs Immature  Granulocytes 0.13 (H) 0.00 - 0.07 K/uL    Comment: Performed at [facility], [address] [place], [place] [address]  Magnesium     Status: None   Collection Time: 08/09/18  3:42 AM  Result Value Ref Range   Magnesium 1.9 1.7 - 2.4 mg/dL    Comment: Performed at [facility], [address] [place], [place] [address]  Phosphorus     Status: None   Collection Time: 08/09/18  3:42 AM  Result Value Ref Range   Phosphorus 4.4 2.5 - 4.6 mg/dL    Comment: Performed at [facility], [address] [place], [place] [address]  Ferritin     Status: None   Collection Time: 08/09/18  3:42 AM  Result Value Ref Range   Ferritin 40 11 - 307 ng/mL    Comment: Performed at [facility], [address] [place], [place] [address]  C-reactive protein     Status: Abnormal   Collection Time: 08/09/18  3:42 AM  Result Value Ref Range   CRP 8.0 (H) <1.0 mg/dL    Comment: Performed at [facility], [address] [place], [place] [address]  D-dimer, quantitative (not at [facility])     Status: Abnormal   Collection Time: 08/09/18  3:42 AM  Result Value Ref Range   D-Dimer, Quant 0.80 (H) 0.00 - 0.50 ug/mL-FEU    Comment: (NOTE) At the manufacturer cut-off of 0.50 ug/mL FEU, this assay has been documented to exclude PE with a sensitivity and negative predictive value of 97 to 99%.  At this time, this assay has not been approved by the FDA to exclude DVT/VTE. Results should be correlated with clinical presentation. Performed at [facility], [address], [place], [place] [address]   Lactate dehydrogenase     Status: None   Collection Time: 08/09/18  3:42 AM  Result Value Ref Range   LDH 174 98 - 192 U/L    Comment: Performed at [facility], [address] [place], [place] [address]  Comprehensive metabolic panel     Status: Abnormal   Collection Time: 08/09/18  3:42 AM  Result Value Ref Range   Sodium 137 135 - 145 mmol/L   Potassium  3.4 (L) 3.5 - 5.1  mmol/L   Chloride 101 98 - 111 mmol/L   CO2 20 (L) 22 - 32 mmol/L   Glucose, Bld 113 (H) 70 - 99 mg/dL   BUN 12 6 - 20 mg/dL   Creatinine, Ser 4.09 0.44 - 1.00 mg/dL   Calcium 8.5 (L) 8.9 - 10.3 mg/dL   Total Protein 6.8 6.5 - 8.1 g/dL   Albumin 2.8 (L) 3.5 - 5.0 g/dL   AST 53 (H) 15 - 41 U/L   ALT 36 0 - 44 U/L   Alkaline Phosphatase 113 38 - 126 U/L   Total Bilirubin 0.6 0.3 - 1.2 mg/dL   GFR calc non Af Amer >60 >60 mL/min   GFR calc Af Amer >60 >60 mL/min   Anion gap 16 (H) 5 - 15    Comment: Performed at [facility], [address] [place], [place] [address]  Glucose, capillary     Status: Abnormal   Collection Time: 08/09/18  4:21 AM  Result Value Ref Range   Glucose-Capillary 101 (H) 70 - 99 mg/dL  Glucose, capillary     Status: Abnormal   Collection Time: 08/09/18  8:09 AM  Result Value Ref Range   Glucose-Capillary 109 (H) 70 - 99 mg/dL  Glucose, capillary     Status: Abnormal   Collection Time: 08/09/18 11:56 AM  Result Value Ref Range   Glucose-Capillary 119 (H) 70 - 99 mg/dL  Glucose, capillary     Status: Abnormal   Collection Time: 08/09/18  5:20 PM  Result Value Ref Range   Glucose-Capillary 103 (H) 70 - 99 mg/dL  Glucose, capillary     Status: Abnormal   Collection Time: 08/09/18  8:21 PM  Result Value Ref Range   Glucose-Capillary 125 (H) 70 - 99 mg/dL  Glucose, capillary     Status: Abnormal   Collection Time: 08/09/18 11:45 PM  Result Value Ref Range   Glucose-Capillary 106 (H) 70 - 99 mg/dL  Glucose, capillary     Status: Abnormal   Collection Time: 08/10/18  3:44 AM  Result Value Ref Range   Glucose-Capillary 108 (H) 70 - 99 mg/dL  Urinalysis, dipstick only     Status: Abnormal   Collection Time: 08/10/18  3:47 AM  Result Value Ref Range   Color, Urine YELLOW YELLOW   APPearance CLEAR CLEAR   Specific Gravity, Urine 1.017 1.005 - 1.030   pH 6.0 5.0 - 8.0   Glucose, UA NEGATIVE NEGATIVE mg/dL   Hgb urine dipstick MODERATE (A) NEGATIVE    Bilirubin Urine NEGATIVE NEGATIVE   Ketones, ur 80 (A) NEGATIVE mg/dL   Protein, ur NEGATIVE NEGATIVE mg/dL   Nitrite NEGATIVE NEGATIVE   Leukocytes,Ua NEGATIVE NEGATIVE    Comment: Performed at [facility], [address] [place], [place] [address]  Comprehensive metabolic panel     Status: Abnormal   Collection Time: 08/10/18  4:56 AM  Result Value Ref Range   Sodium 136 135 - 145 mmol/L   Potassium 4.1 3.5 - 5.1 mmol/L    Comment: DELTA CHECK NOTED   Chloride 103 98 - 111 mmol/L   CO2 20 (L) 22 - 32 mmol/L   Glucose, Bld 110 (H) 70 - 99 mg/dL   BUN 11 6 - 20 mg/dL   Creatinine, Ser 5.62 (L) 0.44 - 1.00 mg/dL   Calcium 8.6 (L) 8.9 - 10.3 mg/dL   Total Protein 6.6 6.5 - 8.1 g/dL   Albumin 2.7 (  L) 3.5 - 5.0 g/dL   AST 45 (H) 15 - 41 U/L   ALT 38 0 - 44 U/L   Alkaline Phosphatase 108 38 - 126 U/L   Total Bilirubin 0.8 0.3 - 1.2 mg/dL   GFR calc non Af Amer >60 >60 mL/min   GFR calc Af Amer >60 >60 mL/min   Anion gap 13 5 - 15    Comment: Performed at [facility], [address] [place], [place] [address]  CBC     Status: Abnormal   Collection Time: 08/10/18  4:56 AM  Result Value Ref Range   WBC 5.2 4.0 - 10.5 K/uL   RBC 3.38 (L) 3.87 - 5.11 MIL/uL   Hemoglobin 9.9 (L) 12.0 - 15.0 g/dL   HCT 60.4 (L) 54.0 - 98.1 %   MCV 89.3 80.0 - 100.0 fL   MCH 29.3 26.0 - 34.0 pg   MCHC 32.8 30.0 - 36.0 g/dL   RDW 19.1 47.8 - 29.5 %   Platelets 186 150 - 400 K/uL   nRBC 0.0 0.0 - 0.2 %    Comment: Performed at [facility], [address] [place], [place] [address]  Magnesium     Status: None   Collection Time: 08/10/18  4:56 AM  Result Value Ref Range   Magnesium 2.3 1.7 - 2.4 mg/dL    Comment: Performed at [facility], [address], [place], [place] [address]  Phosphorus     Status: None   Collection Time: 08/10/18  4:56 AM  Result Value Ref Range   Phosphorus 3.6 2.5 - 4.6 mg/dL    Comment: Performed at [facility], [address] [place], [place] [address]  Glucose, capillary     Status: Abnormal   Collection Time: 08/10/18  7:50 AM  Result Value Ref Range   Glucose-Capillary 128 (H) 70 - 99 mg/dL  Glucose, capillary     Status: Abnormal   Collection Time: 08/10/18 11:15 AM  Result Value Ref Range   Glucose-Capillary 120 (H) 70 - 99 mg/dL    I have reviewed the patient's current medications.  ASSESSMENT: Principal Problem:   Pneumonia due to COVID-19 virus Active Problems:   Language barrier affecting health care   Late prenatal care affecting pregnancy   Sepsis associated hypotension (HCC)   Acute respiratory failure with hypoxia (HCC)   [redacted] weeks gestation of pregnancy   PLAN: COVID-19 - patient appears significantly improved, per RN they are considering moving her out of the ICU - per primary team  Pregnancy - daily prenatal vitamin - cont lovenox  Fetal well being - will consider delivery if patient requires intubation (or if non-reassuring fetal status) - has been receiving solumedrol 40 mg IV Q 8, has not received antenatal corticosteroids - cont NST BID   For OB/GYN issues, please call the Center for [facility] at [facility] Monday - Friday, 8 am - 5 pm: [phone] All other times: [phone]   K. Therese Sarah, M.D. Attending Center for [employer] (Faculty Practice)  08/10/2018 11:33 AM

## 2018-08-10 NOTE — Progress Notes (Signed)
Assisted tele visit to patient with husband.  Diante Barley M, RN  

## 2018-08-10 NOTE — Progress Notes (Signed)
FPTS Interim Progress Note   BP 101/65   Pulse (!) 59   Temp (!) 97.2 F (36.2 C) (Axillary)   Resp (!) 24   Ht 5\' 3"  (1.6 m)   Wt 63.4 kg   LMP 01/02/2018 (Exact Date)   SpO2 99%   BMI 24.76 kg/m    G4P2012 at [redacted]w[redacted]d w/ respiratory distress 2/2 COVID remains in ICU for increased oxygen requirement.  OB following and plan for Csection only if requiring intubation. Currently on 5L O2 and improving. On broad spectrum abx and steroids for fetal lung maturity. Appreciate OB and CCM care, plan to resume care once stable for transfer to the floor.  Ellwood Dense, DO 08/10/2018, 5:34 AM PGY-2, [facility] Family Medicine Service pager [phone]

## 2018-08-11 LAB — CBC
HCT: 31.6 % — ABNORMAL LOW (ref 36.0–46.0)
Hemoglobin: 10.5 g/dL — ABNORMAL LOW (ref 12.0–15.0)
MCH: 29.6 pg (ref 26.0–34.0)
MCHC: 33.2 g/dL (ref 30.0–36.0)
MCV: 89 fL (ref 80.0–100.0)
Platelets: 216 10*3/uL (ref 150–400)
RBC: 3.55 MIL/uL — ABNORMAL LOW (ref 3.87–5.11)
RDW: 13.5 % (ref 11.5–15.5)
WBC: 5.2 10*3/uL (ref 4.0–10.5)
nRBC: 0.4 % — ABNORMAL HIGH (ref 0.0–0.2)

## 2018-08-11 LAB — COMPREHENSIVE METABOLIC PANEL
ALT: 72 U/L — ABNORMAL HIGH (ref 0–44)
AST: 79 U/L — ABNORMAL HIGH (ref 15–41)
Albumin: 2.7 g/dL — ABNORMAL LOW (ref 3.5–5.0)
Alkaline Phosphatase: 104 U/L (ref 38–126)
Anion gap: 13 (ref 5–15)
BUN: 14 mg/dL (ref 6–20)
CO2: 22 mmol/L (ref 22–32)
Calcium: 8.7 mg/dL — ABNORMAL LOW (ref 8.9–10.3)
Chloride: 100 mmol/L (ref 98–111)
Creatinine, Ser: 0.38 mg/dL — ABNORMAL LOW (ref 0.44–1.00)
GFR calc Af Amer: >60 mL/min (ref >60)
GFR calc non Af Amer: >60 mL/min (ref >60)
Glucose, Bld: 102 mg/dL — ABNORMAL HIGH (ref 70–99)
Potassium: 4.2 mmol/L (ref 3.5–5.1)
Sodium: 135 mmol/L (ref 135–145)
Total Bilirubin: 0.3 mg/dL (ref 0.3–1.2)
Total Protein: 6.9 g/dL (ref 6.5–8.1)

## 2018-08-11 LAB — GLUCOSE, CAPILLARY
Glucose-Capillary: 100 mg/dL — ABNORMAL HIGH (ref 70–99)
Glucose-Capillary: 134 mg/dL — ABNORMAL HIGH (ref 70–99)
Glucose-Capillary: 84 mg/dL (ref 70–99)
Glucose-Capillary: 90 mg/dL (ref 70–99)
Glucose-Capillary: 95 mg/dL (ref 70–99)
Glucose-Capillary: 96 mg/dL (ref 70–99)

## 2018-08-11 MED ORDER — METHYLPREDNISOLONE SODIUM SUCC 40 MG IJ SOLR
40.0000 mg | Freq: Two times a day (BID) | INTRAMUSCULAR | Status: DC
  Administered 2018-08-11 – 2018-08-12 (×2): 40 mg via INTRAVENOUS
  Filled 2018-08-11 (×2): qty 1

## 2018-08-11 NOTE — Progress Notes (Signed)
FPTS Interim Progress Note  BP 97/80 (BP Location: Left Arm)   Pulse 69   Temp 98 F (36.7 C) (Oral)   Resp (!) 22   Ht 5\' 3"  (1.6 m)   Wt 63.4 kg   LMP 01/02/2018 (Exact Date)   SpO2 99%   BMI 24.76 kg/m    37yo G4P2 at [redacted]w[redacted]d COVID positive female remains in ICU with OB following for increased oxygen requriement. She is slowly improving on oxygen, broad spectrum antibiotics, and steroids. Appreciate OB and CCM's spectacular care. FM plans to resume care once stable and transfer out of ICU.    Orpah Cobb [place], DO 08/11/2018, 6:42 AM PGY-1, [facility] Family Medicine Service pager [phone]

## 2018-08-11 NOTE — Progress Notes (Signed)
[name]:  Alicia Riley, MRN:  [id number], DOB:  14-Jul-1980, LOS: 9 ADMISSION DATE:  08/02/2018, CONSULTATION DATE:  08/05/18 REFERRING MD:  Dr. Jennette KettleNeal , CHIEF COMPLAINT:  COVID-19 infection  Brief History   38 year old female who is 30 +[redacted] weeks pregnant admitted with respiratory symptoms and was found to be positive for Novel Coronavirus. She began to decompensate requiring supplemental oxygen and PCCM was consulted.   History of present illness   38 year old [place] female (does not speak [place]) who is G4P2 and actively pregnant at [redacted] weeks and 2 days at time of presentation. [facility] with complaints of SOB, cough, and fever. Symptoms started 5/8. She did not require supplemental oxygen, but she was initially hypotensive, responded well to IVF resuscitation. Then overnight 5/11 >5/12 she developed worsening tachypnea and tachycardia. Fetal heart tones also elevated to 180 (expected 140). Throughout the day she became diaphoretic and hypoxic requiring up to 3L of supplemental oxygen via nasal cannula. For this reason PCCM was asked to see in consultation.   Past Medical History  G4P2  Significant Hospital Events   5/9 admit 5/12 transfer to ICu for worsening hypoxia.   Consults:  OB  Procedures:    Significant Diagnostic Tests:    Micro Data:  5/9 SARS Coronavirus 2 > positive 5/12 Blood culture >>>  Antimicrobials:  Azithromycin 5/12 >5/17 Ceftraixone 5/12 >5/19  Interim history/subjective:  Feels better, no events overnight  Objective   Blood pressure 98/66, pulse 84, temperature 98 F (36.7 C), temperature source Oral, resp. rate (!) 25, height 5\' 3"  (1.6 m), weight 63.4 kg, last menstrual period 01/02/2018, SpO2 98 %, unknown if currently breastfeeding.     Intake/Output Summary (Last 24 hours) at 08/11/2018 0944 Last data filed at 08/10/2018 2000 Gross per 24 hour  Intake 454.22 ml  Output 1075 ml  Net -620.78 ml   Filed Weights   08/03/18  0006 08/05/18 1800  Weight: 65.1 kg 63.4 kg   Examination: General: Well appearing, NAD HEENT: [place]/AT, PERRL, EOM-I and MMM Neuro: Alert and interactive, moving all ext to command CV: RRR, Nl S1/S2 and -M/R/G PULM: CTA bilaterally GI: Soft, NT, ND and +BS extremities: -edema and -tenderness Skin: no rashes or lesions  I reviewed CXR myself, mild infiltrate noted  Desaturated from 99% to 96% on ambulation on 4L  Resolved Hospital Problem list     Assessment & Plan:   COVID-19 infection: - Titrate O2 for sat of 92-95% - IS and flutter valve - BD as ordered via MDI - Continue Remdesivir 3/10 days completed, continue to then - LFTs daily and stop if 5x normal - CBC daily for monitoring - U/A daily to evaluate any renal deterioration - Hold further diuretics - BMET in AM - Replace electrolytes as indicated - F/U on culture - Currently on Rocephin and Zithromax empirical treatment for questionable pneumonia, stop date in place as above - No convalescent plasma - Solumedrol down to 40 q12 and taper over the over the next week, hope is to stop steroids and remdesivir at the same time - Continue low molecular weight heparin at twice daily dosing  Sepsis: secondary to above  Steroid induced hyperglycemia  CBG (last 3)  Recent Labs    08/10/18 2007 08/11/18 0004 08/11/18 0513  GLUCAP 129* 96 95   -Sliding scale insulin protocol  Active pregnancy: -OB is following  Best practice:  Diet: Regular  Pain/Anxiety/Delirium protocol (if indicated): N/a  VAP protocol (if indicated): N/a DVT prophylaxis: lovenox pending d-dimer GI prophylaxis: n/a Glucose control: n/a Mobility: BR Code Status: FULL Family Communication: 08/08/2018 patient updated at bedside Disposition: ICU  Labs   CBC: Recent Labs  Lab 08/05/18 0638 08/06/18 0300  08/07/18 0301 08/08/18 2239 08/09/18 0206 08/09/18 0342 08/10/18 0456 08/11/18 0420  WBC 5.4 4.9  --  7.0  --   --  8.1 5.2 5.2   NEUTROABS 4.4 3.9  --  6.0  --   --  6.7  --   --   HGB 8.9* 8.5*   < > 9.7* 9.9* 10.5* 10.3* 9.9* 10.5*  HCT 26.5* 26.3*   < > 29.7* 29.0* 31.0* 30.1* 30.2* 31.6*  MCV 89.8 91.3  --  91.4  --   --  87.8 89.3 89.0  PLT 110* 106*  --  144*  --   --  161 186 216   < > = values in this interval not displayed.    Basic Metabolic Panel: Recent Labs  Lab 08/05/18 2778  08/07/18 0301 08/08/18 0451 08/08/18 2239 08/09/18 0206 08/09/18 0342 08/10/18 0456 08/11/18 0420  NA 136   < > 138 136 136 138 137 136 135  K 3.5   < > 3.7 3.5 3.4* 3.5 3.4* 4.1 4.2  CL 111   < > 111 107  --   --  101 103 100  CO2 16*   < > 12* 18*  --   --  20* 20* 22  GLUCOSE 84   < > 154* 120*  --   --  113* 110* 102*  BUN <5*   < > <5* <5*  --   --  12 11 14   CREATININE 0.45   < > 0.60 0.43*  --   --  0.51 0.41* 0.38*  CALCIUM 7.8*   < > 8.5* 8.1*  --   --  8.5* 8.6* 8.7*  MG 1.7  --  1.6* 1.8  --   --  1.9 2.3  --   PHOS  --   --  3.4 3.7  --   --  4.4 3.6  --    < > = values in this interval not displayed.   GFR: Estimated Creatinine Clearance: 86.3 mL/min (A) (by C-G formula based on SCr of 0.38 mg/dL (L)). Recent Labs  Lab 08/05/18 2010 08/05/18 2015 08/06/18 0300 08/07/18 0301 08/09/18 0342 08/10/18 0456 08/11/18 0420  PROCALCITON 0.20  --  0.22 0.16  --   --   --   WBC  --   --  4.9 7.0 8.1 5.2 5.2  LATICACIDVEN  --  0.7 0.5  --   --   --   --     Liver Function Tests: Recent Labs  Lab 08/05/18 0638 08/06/18 0300 08/09/18 0342 08/10/18 0456 08/11/18 0420  AST 25 26 53* 45* 79*  ALT 17 18 36 38 72*  ALKPHOS 71 76 113 108 104  BILITOT 0.5 0.4 0.6 0.8 0.3  PROT 5.7* 5.6* 6.8 6.6 6.9  ALBUMIN 2.5* 2.3* 2.8* 2.7* 2.7*   No results for input(s): LIPASE, AMYLASE in the last 168 hours. No results for input(s): AMMONIA in the last 168 hours.  ABG    Component Value Date/Time   PHART 7.450 08/09/2018 0206   PCO2ART 30.0 (L) 08/09/2018 0206   PO2ART 141.0 (H) 08/09/2018 0206   HCO3 21.0  08/09/2018 0206   TCO2 22 08/09/2018 0206   ACIDBASEDEF 2.0 08/09/2018 0206  O2SAT 99.0 08/09/2018 0206     Coagulation Profile: No results for input(s): INR, PROTIME in the last 168 hours.  Cardiac Enzymes: Recent Labs  Lab 08/05/18 2010 08/06/18 0300  TROPONINI <0.03 <0.03    HbA1C: No results found for: HGBA1C  CBG: Recent Labs  Lab 08/10/18 1115 08/10/18 1521 08/10/18 2007 08/11/18 0004 08/11/18 0513  GLUCAP 120* 78 129* 96 95   Discussed with FPTS-Resident, will pick up on 5/19 with PCCM off  Alyson Reedy, M.D. [facility] Pulmonary/Critical Care Medicine. Pager: [phone]. After hours pager: [phone].

## 2018-08-11 NOTE — Progress Notes (Signed)
FACULTY PRACTICE ANTEPARTUM(COMPREHENSIVE) NOTE  Alicia Riley is a 38 y.o. [id number] at [redacted]w[redacted]d by best clinical estimate who is admitted for COVID.   Fetal presentation is unsure. Length of Stay:  9  Days  Subjective: Transferred out of the ICU this am. Now only requiring 2L [place]. For 10 days of Remdesivir. Patient reports the fetal movement as active. Patient reports uterine contraction  activity as none. Patient reports  vaginal bleeding as none. Patient describes fluid per vagina as None.  Vitals:  Blood pressure 94/65, pulse 75, temperature 98.3 F (36.8 C), temperature source Oral, resp. rate (!) 22, height 5\' 3"  (1.6 m), weight 63.4 kg, last menstrual period 01/02/2018, SpO2 98 %, unknown if currently breastfeeding. Physical Examination:  General appearance - alert, well appearing, and in no distress Chest - normal effort Abdomen - gravid, non-tender Fundal Height:  size equals dates Extremities: Homans sign is negative, no sign of DVT  Membranes:intact  Fetal Monitoring: reactive per RROB  Labs:  Results for orders placed or performed during the hospital encounter of 08/02/18 (from the past 24 hour(s))  Glucose, capillary   Collection Time: 08/10/18  8:07 PM  Result Value Ref Range   Glucose-Capillary 129 (H) 70 - 99 mg/dL  Glucose, capillary   Collection Time: 08/11/18 12:04 AM  Result Value Ref Range   Glucose-Capillary 96 70 - 99 mg/dL  Comprehensive metabolic panel   Collection Time: 08/11/18  4:20 AM  Result Value Ref Range   Sodium 135 135 - 145 mmol/L   Potassium 4.2 3.5 - 5.1 mmol/L   Chloride 100 98 - 111 mmol/L   CO2 22 22 - 32 mmol/L   Glucose, Bld 102 (H) 70 - 99 mg/dL   BUN 14 6 - 20 mg/dL   Creatinine, Ser 2.35 (L) 0.44 - 1.00 mg/dL   Calcium 8.7 (L) 8.9 - 10.3 mg/dL   Total Protein 6.9 6.5 - 8.1 g/dL   Albumin 2.7 (L) 3.5 - 5.0 g/dL   AST 79 (H) 15 - 41 U/L   ALT 72 (H) 0 - 44 U/L   Alkaline Phosphatase 104 38 - 126 U/L   Total Bilirubin 0.3 0.3 - 1.2  mg/dL   GFR calc non Af Amer >60 >60 mL/min   GFR calc Af Amer >60 >60 mL/min   Anion gap 13 5 - 15  CBC   Collection Time: 08/11/18  4:20 AM  Result Value Ref Range   WBC 5.2 4.0 - 10.5 K/uL   RBC 3.55 (L) 3.87 - 5.11 MIL/uL   Hemoglobin 10.5 (L) 12.0 - 15.0 g/dL   HCT 57.3 (L) 22.0 - 25.4 %   MCV 89.0 80.0 - 100.0 fL   MCH 29.6 26.0 - 34.0 pg   MCHC 33.2 30.0 - 36.0 g/dL   RDW 27.0 62.3 - 76.2 %   Platelets 216 150 - 400 K/uL   nRBC 0.4 (H) 0.0 - 0.2 %  Glucose, capillary   Collection Time: 08/11/18  5:13 AM  Result Value Ref Range   Glucose-Capillary 95 70 - 99 mg/dL  Glucose, capillary   Collection Time: 08/11/18  8:48 AM  Result Value Ref Range   Glucose-Capillary 100 (H) 70 - 99 mg/dL  Glucose, capillary   Collection Time: 08/11/18 12:54 PM  Result Value Ref Range   Glucose-Capillary 84 70 - 99 mg/dL  Glucose, capillary   Collection Time: 08/11/18  4:41 PM  Result Value Ref Range   Glucose-Capillary 90 70 - 99 mg/dL  Medications:  Scheduled . acetaminophen  650 mg Oral Q8H  . Chlorhexidine Gluconate Cloth  6 each Topical Daily  . docusate sodium  100 mg Oral BID  . enoxaparin (LOVENOX) injection  40 mg Subcutaneous Q12H  . insulin aspart  3-9 Units Subcutaneous Q4H  . methylPREDNISolone (SOLU-MEDROL) injection  40 mg Intravenous Q12H  . sodium chloride flush  3 mL Intravenous Q12H   I have reviewed the patient's current medications.  ASSESSMENT: Principal Problem:   Pneumonia due to COVID-19 virus Active Problems:   Language barrier affecting health care   Late prenatal care affecting pregnancy   Sepsis associated hypotension (HCC)   Acute respiratory failure with hypoxia (HCC)   [redacted] weeks gestation of pregnancy   PLAN: Doing well, slowly improving from COVID Fetal status is reassuring.  Alicia Boresanya S Symphoni Helbling, MD 08/11/2018,4:47 PM

## 2018-08-11 NOTE — Progress Notes (Addendum)
Family Medicine Teaching Service Daily Progress Note Intern Pager: [phone]  Patient name: Alicia Riley Medical record number: [id number] Date of birth: [birth year] Age: 38 y.o. Gender: female  Primary Care Provider: Patient, No Pcp Per Consultants: CCM, OB/GYN Code Status: FUll Emergency Contact: "Om-lah" [phone]   Pt Overview and Major Events to Date:  5/9 admitted to FPTS, COVID positive 5/12 Transferred to ICU due to worsening respiratory status 5/18 Transferred to floor  Antibiotic Course:  CTX 5/12-5/19 Azithromycin 5/12-5/17  Assessment and Plan: Alicia OddiMai Hasini is 67aG4P2 [age] y.o. female at 5117w4d who presented w/ COVID-19 transferred from ICU on 5/18. No pmhx  COVID-19 Positive - Day 12 of illness Patient transferred from ICU on 5/18. While in ICU patient was treated with CTX (5/12-5/19) and Azithromycin (5/12-5/17) for questionable PNA. Patient to complete final day of CTX today. Has remained hemodynamically stable on RA and afebrile since. CBC WNL. Electrolytes WNL. UA with large leuks and few bacteria. Blood culture neg x 5 days. AST and ALT levels continue to rise (AST 79>154, ALT 72>155, baseline AST 21, ALT 70). 5x upper limit of normal: 205). Per patient, began experiencing dizziness this morning. Denies CP or SOB, no cough. Some mild congestion. Tolerating PO. Good urine output overnight (1.8L UOP). Ambulating well. Per review, appears 5-10 day treatment with Remdesivir  is equivalent, thus if LFT's continue to elevate, will opt to discontnue all together. Especially given her improvement.  - s/p CTX and Azithromycin  - Per CCM recs:   - Continue Remdesivir (day 4/10)  - daily LFT's, CBC, UA, BMP  - Stop Remdesiviur if LFT's 5x normal  - Steroids  - continue Lovenox SQ BID  - Transition from IV Solumedrol 40mg  to Prednisone 40mg  BID  - consider discontinuing Remdesivir if LFT's continue to rise, monitor AM CMP - follow up urine culture  - Oxygen PRN, Maintain O2 >95%, wean O2  as tolerated - replace electrolytes as needed - contact/droplet precautions - tylenol q8hr PO - Robitussin DM q4h PRN - Combivent 1 puff q6 PRN for wheezing  - avoid NSAIDs - k-pad - monitor O2 sats q2 hr, vitals q2 - stricts I/Os - incentive spirometry  G4P2 @ 8017w4d Reports good fetal movement. Denies any loss of fluid or blood. Denies any contractions.  - daily prenatal vitamin - NST QD  Hyperglycemia CBG 80-100's. 2/2 to steroid.  - rSSI - CBG's q4  Elevated Transaminases: AST 79>154, ALT 72>155.Likely secondary to Remdesivir.  - Continue to monitor  - Stop if 5x upper limit of normal   FEN/GI: Regular diet PPx: Lovenox  Disposition: Once able to wean off O2  Subjective:  Patient reports doing well this AM. Some dizziness when she wok up but denies any SOB or CP. Denies cough, just some congestion. Tolerating PO and ambulating well. Endorses good fetal movement, denies any loss of fluid, blood, or contractions.  Objective: Temp:  [98 F (36.7 C)-98.4 F (36.9 C)] 98.1 F (36.7 C) (05/19 0410) Pulse Rate:  [58-85] 61 (05/19 0410) Resp:  [18-31] 20 (05/19 0410) BP: (87-103)/(54-67) 97/60 (05/19 0410) SpO2:  [95 %-99 %] 95 % (05/19 0410) Physical Exam: Performed by attending physician  Gen NAD while sitting in chair on 2l/min O2 per [place]. Lungs no wheeze.  Scattered coarse rhonchi. Cardiac RRR without m or g Abd gravid uterus is non tender. Laboratory: Recent Labs  Lab 08/10/18 0456 08/11/18 0420 08/12/18 0452  WBC 5.2 5.2 4.6  HGB 9.9* 10.5* 10.3*  HCT 30.2* 31.6*  31.2*  PLT 186 216 231   Recent Labs  Lab 08/10/18 0456 08/11/18 0420 08/12/18 0452  NA 136 135 137  K 4.1 4.2 3.7  CL 103 100 100  CO2 20* 22 24  BUN 11 14 12   CREATININE 0.41* 0.38* 0.42*  CALCIUM 8.6* 8.7* 8.6*  PROT 6.6 6.9 6.5  BILITOT 0.8 0.3 0.4  ALKPHOS 108 104 93  ALT 38 72* 155*  AST 45* 79* 154*  GLUCOSE 110* 102* 84    COVID positive LA 0.8>0.7>0.5 Procalcitonin  0.20>0.22>0.16 D-dimer: 1.48>0.80>0.80 CRP 11.8>9.2>8.0 LDH 171>174 Ferritin 40>40 Troponin: <0.03, <0.03 BNP 60.4  Urinalysis    Component Value Date/Time   COLORURINE YELLOW 08/12/2018 0533   APPEARANCEUR CLEAR 08/12/2018 0533   LABSPEC 1.014 08/12/2018 0533   PHURINE 6.0 08/12/2018 0533   GLUCOSEU NEGATIVE 08/12/2018 0533   HGBUR SMALL (A) 08/12/2018 0533   BILIRUBINUR NEGATIVE 08/12/2018 0533   KETONESUR NEGATIVE 08/12/2018 0533   PROTEINUR NEGATIVE 08/12/2018 0533   UROBILINOGEN 0.2 07/23/2013 1533   NITRITE NEGATIVE 08/12/2018 0533   LEUKOCYTESUR LARGE (A) 08/12/2018 0533    Imaging/Diagnostic Tests: No results found.  Joana Reamer, DO 08/12/2018, 9:19 AM PGY-1, [place] [facility] FPTS Intern pager: [phone], text pages welcome  I saw and examined this patient.  I discussed with the full resident team in rounds.  I agree with the documentation and management of Dr. Mauri Reading.  She seems to be recovering nicely at this point.

## 2018-08-12 LAB — CBC
HCT: 31.2 % — ABNORMAL LOW (ref 36.0–46.0)
Hemoglobin: 10.3 g/dL — ABNORMAL LOW (ref 12.0–15.0)
MCH: 29.6 pg (ref 26.0–34.0)
MCHC: 33 g/dL (ref 30.0–36.0)
MCV: 89.7 fL (ref 80.0–100.0)
Platelets: 231 10*3/uL (ref 150–400)
RBC: 3.48 MIL/uL — ABNORMAL LOW (ref 3.87–5.11)
RDW: 13.6 % (ref 11.5–15.5)
WBC: 4.6 10*3/uL (ref 4.0–10.5)
nRBC: 0 % (ref 0.0–0.2)

## 2018-08-12 LAB — URINALYSIS, ROUTINE W REFLEX MICROSCOPIC
Bilirubin Urine: NEGATIVE
Glucose, UA: NEGATIVE mg/dL
Ketones, ur: NEGATIVE mg/dL
Nitrite: NEGATIVE
Protein, ur: NEGATIVE mg/dL
Specific Gravity, Urine: 1.014 (ref 1.005–1.030)
pH: 6 (ref 5.0–8.0)

## 2018-08-12 LAB — COMPREHENSIVE METABOLIC PANEL
ALT: 155 U/L — ABNORMAL HIGH (ref 0–44)
AST: 154 U/L — ABNORMAL HIGH (ref 15–41)
Albumin: 2.7 g/dL — ABNORMAL LOW (ref 3.5–5.0)
Alkaline Phosphatase: 93 U/L (ref 38–126)
Anion gap: 13 (ref 5–15)
BUN: 12 mg/dL (ref 6–20)
CO2: 24 mmol/L (ref 22–32)
Calcium: 8.6 mg/dL — ABNORMAL LOW (ref 8.9–10.3)
Chloride: 100 mmol/L (ref 98–111)
Creatinine, Ser: 0.42 mg/dL — ABNORMAL LOW (ref 0.44–1.00)
GFR calc Af Amer: >60 mL/min (ref >60)
GFR calc non Af Amer: >60 mL/min (ref >60)
Glucose, Bld: 84 mg/dL (ref 70–99)
Potassium: 3.7 mmol/L (ref 3.5–5.1)
Sodium: 137 mmol/L (ref 135–145)
Total Bilirubin: 0.4 mg/dL (ref 0.3–1.2)
Total Protein: 6.5 g/dL (ref 6.5–8.1)

## 2018-08-12 LAB — GLUCOSE, CAPILLARY
Glucose-Capillary: 100 mg/dL — ABNORMAL HIGH (ref 70–99)
Glucose-Capillary: 104 mg/dL — ABNORMAL HIGH (ref 70–99)
Glucose-Capillary: 123 mg/dL — ABNORMAL HIGH (ref 70–99)
Glucose-Capillary: 156 mg/dL — ABNORMAL HIGH (ref 70–99)
Glucose-Capillary: 87 mg/dL (ref 70–99)
Glucose-Capillary: 90 mg/dL (ref 70–99)
Glucose-Capillary: 99 mg/dL (ref 70–99)

## 2018-08-12 LAB — PHOSPHORUS: Phosphorus: 2.9 mg/dL (ref 2.5–4.6)

## 2018-08-12 LAB — MAGNESIUM: Magnesium: 2.2 mg/dL (ref 1.7–2.4)

## 2018-08-12 MED ORDER — PREDNISONE 20 MG PO TABS: 40.0000 mg | ORAL_TABLET | Freq: Two times a day (BID) | ORAL | Status: DC

## 2018-08-12 MED ORDER — PREDNISONE 20 MG PO TABS: 40.0000 mg | ORAL_TABLET | Freq: Every day | ORAL | Status: DC

## 2018-08-12 MED ORDER — PREDNISONE 20 MG PO TABS
30.0000 mg | ORAL_TABLET | Freq: Two times a day (BID) | ORAL | Status: DC
  Administered 2018-08-13: 30 mg via ORAL
  Filled 2018-08-12: qty 1

## 2018-08-12 MED ORDER — PREDNISONE 20 MG PO TABS
40.0000 mg | ORAL_TABLET | Freq: Every day | ORAL | Status: AC
  Administered 2018-08-12: 40 mg via ORAL
  Filled 2018-08-12: qty 2

## 2018-08-12 MED ORDER — PREDNISONE 20 MG PO TABS: 50.0000 mg | ORAL_TABLET | Freq: Two times a day (BID) | ORAL | Status: DC

## 2018-08-12 NOTE — Progress Notes (Signed)
Pt hypotensive (see vitals), alert and oriented x4. No acute distress noted. Provider notified and made aware. Provider stated to maintain monitoring and  vitals q4.

## 2018-08-12 NOTE — Discharge Summary (Signed)
Family Medicine Teaching [facility] Discharge Summary  Patient name: Alicia Riley Medical record number: [id number] Date of birth: [birth year] Age: 38 y.o. Gender: female Date of Admission: 08/02/2018  Date of Discharge: 08/15/2018 Admitting Physician: Nestor RampSara L Neal, MD  Primary Care Provider: Patient, No Pcp Per Consultants: CCM, OB/GYN  Indication for Hospitalization: Shortness of breath  Discharge Diagnoses/Problem List:  Acute respiratory failure with hypoxia Sepsis associated with hypotension Pneumonia due to COVID-19 virus Supervision of normal pregnancy Language barrier affecting health care Hyperglycemia secondary to steroids Elevated transaminases  Disposition: Home  Discharge Condition: Improved  Discharge Exam: ** See attending attestation for physical exam **  Brief Hospital Course:  Alicia Riley is a [id number] 38 year old female who presented on day 2 (day 1=08/02/18 @ 31wks + 1 day) of illness and chief complaint of shortness of breath, cough, fever, tachycardia and positive COVID, though she was noted to be satting well on room air. OB was notified that this patient was admitted to the hospital and BID NSTs were performed. From days 2 through 4 of illness, patient was tachypneic, tachycardic in the low 100s, and hypotensive with maps down to 54 and averaging in the low 60s, but satting in the high 90s on room air.  Hypotension was improved with 500 cc boluses of normal saline and was also assumed to be in part due to patient's pregnancy.   By day 5, patient was satting in the low 90s on room air with increased tachypnea and work of breathing.  Patient was started on 2 L of oxygen to help improve work of breathing, especially with declining sats on room air. At this time, rapid OB was called for NST and showed FHR to 180, which had been previously stable in 140s. In this patient's case, threshold was low to escalate care and CCM was consulted to transfer patient to ICU given new oxygen  status and increased HR. From day 6 through day 11 of illness (30+6 through 31+3), patient remained in ICU for close supervision.  The plan was if patient was to be intubated, OB plan to perform emergency C-section.  On day 12 of illness, patient was transferred back to primary team as her status greatly improved. Patient was initially started on Remdesivir, however given rising LFT's 5x upper limit of normal, she was discontinued on day 4. Her steroids were tapered accordingly. She was slowly weaned from oxygen and by day of discharge was hemodynamically stable on room air. Her LFT's were slowly down-trending on time of discharge. It was recommended she have repeat blood work at follow up to continue to ensure resolution.  At discharge, patient was scheduled for follow-up at [facility] for a telemedicine hospital follow up. OB/GYN arranged for outpatient follow up which includes virtual appointments at 32, 34, and 36 weeks.  Social Patient's admission was complicated by language barrier.  On his admission, reported that her husband had been coughing on and off this week.  She had 2 children at home that did not appear to be sick.  Upon speaking to patient's husband, he had continued to go to work at a factory in [place] for the week prior to patient's admission despite having cough.  Patient's husband was contacted via phone during the first couple days of patient's admission and he reported that he was afebrile and did not complain of cough.  He was encouraged to remain home from work and go to the health department to be tested for SARS-CoV-2.  Arrangements were made by the primary team to help patient go to health department in setting of language barrier.  Patient's children were unable to be tested due to their age.  Family was encouraged to isolate and continue social distancing at home.  Health department was also notified that patient's husband continue to go to  work at [employer].  On May 18, patient's husband COVID testing returned positive. Since then, he had remained asymptomatic. He and the children were self-quarntined.  Issues for Follow Up:  1. Ensure patient follows up with OBGYN at appropriate dates 2. Continue to monitor LFT's for resolution after discontinuing Remdesivir - please continue to trend LFT's until normalize  Significant Procedures: NST  Significant Labs and Imaging:  Recent Labs  Lab 08/13/18 0551 08/14/18 0356 08/15/18 0558  WBC 5.2 6.5 7.0  HGB 10.2* 10.0* 10.4*  HCT 30.8* 30.7* 30.8*  PLT 250 265 260   Recent Labs  Lab 08/09/18 0342 08/10/18 0456 08/11/18 0420 08/12/18 0452 08/13/18 0551 08/14/18 0356 08/15/18 0558  NA 137 136 135 137 137 137 137  K 3.4* 4.1 4.2 3.7 3.6 4.0 3.6  CL 101 103 100 100 102 105 103  CO2 20* 20* GLUCOSE 113* 110* 102* 84 93 102* 92  BUN CREATININE 0.51 0.41* 0.38* 0.42* 0.45 0.39* 0.45  CALCIUM 8.5* 8.6* 8.7* 8.6* 8.8* 8.6* 8.8*  MG 1.9 2.3  --  2.2 2.0  --   --   PHOS 4.4 3.6  --  2.9 2.9  --   --   ALKPHOS 113 108 104 93 96 98 87  AST 53* 45* 79* 154* 199* 194* 173*  ALT 36 38 72* 155* 236* 271* 283*  ALBUMIN 2.8* 2.7* 2.7* 2.7* 2.7* 2.7* 2.7*    COVID positive LA 0.8>0.7>0.5 Procalcitonin 0.20>0.22>0.16 D-dimer: 1.48>0.80>0.80 CRP 11.8>9.2>8.0 LDH 171>174 Ferritin 40>40 Troponin: <0.03, <0.03 BNP 60.4  Dg Chest Port 1 View  Result Date: 08/09/2018 CLINICAL DATA:  Abnormal respiration EXAM: PORTABLE CHEST 1 VIEW COMPARISON:  08/07/2018 FINDINGS: Mild patchy bilateral lower lobe opacities, atelectasis versus pneumonia, grossly unchanged. No definite pleural effusions. No pneumothorax. The heart is top-normal in size. IMPRESSION: Mild patchy bilateral lower lobe opacities, atelectasis versus pneumonia. Electronically Signed   By: Charline Bills M.D.   On: 08/09/2018 06:53   Dg Chest Port 1 View  Result  Date: 08/07/2018 CLINICAL DATA:  38 year old female with abnormal respirations EXAM: PORTABLE CHEST 1 VIEW COMPARISON:  Prior chest x-ray 08/05/2018 FINDINGS: Persistent low inspiratory volumes with patchy bibasilar airspace opacities. No significant interval change. No evidence of pulmonary edema. Suspect persistent small left pleural effusion. No evidence of pneumothorax. Cardiac and mediastinal contours remain unchanged. No acute osseous abnormality. IMPRESSION: Persistently low lung volumes with patchy bibasilar airspace opacities which may reflect pneumonia, aspiration, or less likely atelectasis. Electronically Signed   By: Malachy Moan M.D.   On: 08/07/2018 08:22   Dg Chest Port 1 View  Result Date: 08/05/2018 CLINICAL DATA:  COVID-19 pneumonia. EXAM: PORTABLE CHEST 1 VIEW COMPARISON:  Chest x-ray dated Aug 02, 2018. FINDINGS: The heart size and mediastinal contours are within normal limits. Normal pulmonary vascularity. Persistent low lung volumes with bibasilar opacities. Possible new trace left pleural effusion. No pneumothorax. No acute osseous abnormality. IMPRESSION: 1. Unchanged low lung volumes with bibasilar airspace disease. 2. Possible new trace left pleural effusion. Electronically Signed   By: Vickki Hearing.D.  On: 08/05/2018 17:59   Dg Chest Portable 1 View  Result Date: 08/02/2018 CLINICAL DATA:  Fever and cough with positive COVID-19 test EXAM: PORTABLE CHEST 1 VIEW COMPARISON:  10/20/2013 FINDINGS: Cardiac shadows within normal limits. The lungs are hypoinflated with bibasilar opacities. No sizable effusion is seen. No bony abnormality is noted. IMPRESSION: Bibasilar opacities and poor inspiratory effort. Electronically Signed   By: Alcide Clever M.D.   On: 08/02/2018 19:03     Results/Tests Pending at Time of Discharge: None  Discharge Medications:  Allergies as of 08/15/2018   No Known Allergies     Medication List    TAKE these medications   AMBULATORY NON  FORMULARY MEDICATION 1 Device by Other route once a week. Blood Pressure Cuff Medium Monitored Regularly at home ICD 10:Z34.90   guaiFENesin-dextromethorphan 100-10 MG/5ML syrup Commonly known as:  ROBITUSSIN DM Take 5 mLs by mouth every 4 (four) hours as needed for cough.   Ipratropium-Albuterol 20-100 MCG/ACT Aers respimat Commonly known as:  COMBIVENT Inhale 1 puff into the lungs every 6 (six) hours as needed for wheezing.   phenol 1.4 % Liqd Commonly known as:  CHLORASEPTIC Use as directed 1 spray in the mouth or throat as needed for throat irritation / pain.   polyethylene glycol 17 g packet Commonly known as:  MIRALAX / GLYCOLAX Take 17 g by mouth daily as needed for mild constipation.   predniSONE 10 MG tablet Commonly known as:  DELTASONE Take 1 tablet (10 mg total) by mouth daily for 1 day.   prenatal multivitamin Tabs tablet Take 1 tablet by mouth daily.       Discharge Instructions: Please refer to Patient Instructions section of EMR for full details.  Patient was counseled important signs and symptoms that should prompt return to medical care, changes in medications, dietary instructions, activity restrictions, and follow up appointments.   Follow-Up Appointments: Follow-up Information    [facility] Follow up on 08/25/2018.   Why:  Hospital follow-up: Telephone visit at 2:30pm Contact information: [address] [place] [address] [phone]       [facility] Follow up.   Specialty:  Obstetrics and Gynecology Why:  They will call you to schedule an appointment next week--we will need to get you a blood pressure cuff for use at home. Contact information: [address], [address] [id number] mc [place] [address] [phone]          Joana Reamer, DO 08/15/2018, 1:51 PM PGY-1, [facility]

## 2018-08-12 NOTE — Progress Notes (Signed)
Patient ID: Alicia Riley, female   DOB: November 28, 1980, 38 y.o.   MRN: [id number] FACULTY PRACTICE ANTEPARTUM(COMPREHENSIVE) NOTE  Alicia Riley is a 38 y.o. [id number] at [redacted]w[redacted]d by best clinical estimate who is admitted for Covid 19.   Fetal presentation is unsure. Length of Stay:  10  Days  Subjective: Feels better. Some dizziness. Still with mild cough and O2 needs. Patient reports the fetal movement as active. Patient reports uterine contraction  activity as none. Patient reports  vaginal bleeding as none. Patient describes fluid per vagina as None.  Vitals:  Blood pressure (!) 104/59, pulse 61, temperature 98.9 F (37.2 C), temperature source Oral, resp. rate 20, height 5\' 3"  (1.6 m), weight 63.4 kg, last menstrual period 01/02/2018, SpO2 95 %, unknown if currently breastfeeding. Physical Examination:  General appearance - alert, well appearing, and in no distress Chest - normal effort Abdomen - gravid, non-tender Fundal Height:  size equals dates Extremities: Homans sign is negative, no sign of DVT  Membranes:intact  Fetal Monitoring: reactive per RROB yesterday  Labs:  Results for orders placed or performed during the hospital encounter of 08/02/18 (from the past 24 hour(s))  Glucose, capillary   Collection Time: 08/11/18 12:54 PM  Result Value Ref Range   Glucose-Capillary 84 70 - 99 mg/dL  Glucose, capillary   Collection Time: 08/11/18  4:41 PM  Result Value Ref Range   Glucose-Capillary 90 70 - 99 mg/dL  Glucose, capillary   Collection Time: 08/11/18  7:56 PM  Result Value Ref Range   Glucose-Capillary 134 (H) 70 - 99 mg/dL  Glucose, capillary   Collection Time: 08/12/18 12:07 AM  Result Value Ref Range   Glucose-Capillary 100 (H) 70 - 99 mg/dL  Glucose, capillary   Collection Time: 08/12/18  4:08 AM  Result Value Ref Range   Glucose-Capillary 87 70 - 99 mg/dL  CBC   Collection Time: 08/12/18  4:52 AM  Result Value Ref Range   WBC 4.6 4.0 - 10.5 K/uL   RBC 3.48 (L) 3.87 -  5.11 MIL/uL   Hemoglobin 10.3 (L) 12.0 - 15.0 g/dL   HCT 43.3 (L) 29.5 - 18.8 %   MCV 89.7 80.0 - 100.0 fL   MCH 29.6 26.0 - 34.0 pg   MCHC 33.0 30.0 - 36.0 g/dL   RDW 41.6 60.6 - 30.1 %   Platelets 231 150 - 400 K/uL   nRBC 0.0 0.0 - 0.2 %  Comprehensive metabolic panel   Collection Time: 08/12/18  4:52 AM  Result Value Ref Range   Sodium 137 135 - 145 mmol/L   Potassium 3.7 3.5 - 5.1 mmol/L   Chloride 100 98 - 111 mmol/L   CO2 24 22 - 32 mmol/L   Glucose, Bld 84 70 - 99 mg/dL   BUN 12 6 - 20 mg/dL   Creatinine, Ser 6.01 (L) 0.44 - 1.00 mg/dL   Calcium 8.6 (L) 8.9 - 10.3 mg/dL   Total Protein 6.5 6.5 - 8.1 g/dL   Albumin 2.7 (L) 3.5 - 5.0 g/dL   AST 093 (H) 15 - 41 U/L   ALT 155 (H) 0 - 44 U/L   Alkaline Phosphatase 93 38 - 126 U/L   Total Bilirubin 0.4 0.3 - 1.2 mg/dL   GFR calc non Af Amer >60 >60 mL/min   GFR calc Af Amer >60 >60 mL/min   Anion gap 13 5 - 15  Magnesium   Collection Time: 08/12/18  4:52 AM  Result Value Ref Range  Magnesium 2.2 1.7 - 2.4 mg/dL  Phosphorus   Collection Time: 08/12/18  4:52 AM  Result Value Ref Range   Phosphorus 2.9 2.5 - 4.6 mg/dL  Urinalysis, Routine w reflex microscopic   Collection Time: 08/12/18  5:33 AM  Result Value Ref Range   Color, Urine YELLOW YELLOW   APPearance CLEAR CLEAR   Specific Gravity, Urine 1.014 1.005 - 1.030   pH 6.0 5.0 - 8.0   Glucose, UA NEGATIVE NEGATIVE mg/dL   Hgb urine dipstick SMALL (A) NEGATIVE   Bilirubin Urine NEGATIVE NEGATIVE   Ketones, ur NEGATIVE NEGATIVE mg/dL   Protein, ur NEGATIVE NEGATIVE mg/dL   Nitrite NEGATIVE NEGATIVE   Leukocytes,Ua LARGE (A) NEGATIVE   RBC / HPF 6-10 0 - 5 RBC/hpf   WBC, UA 11-20 0 - 5 WBC/hpf   Bacteria, UA FEW (A) NONE SEEN   Squamous Epithelial / LPF 0-5 0 - 5   Mucus PRESENT   Glucose, capillary   Collection Time: 08/12/18  8:26 AM  Result Value Ref Range   Glucose-Capillary 104 (H) 70 - 99 mg/dL    Medications:  Scheduled . acetaminophen  650 mg  Oral Q8H  . Chlorhexidine Gluconate Cloth  6 each Topical Daily  . enoxaparin (LOVENOX) injection  40 mg Subcutaneous Q12H  . insulin aspart  3-9 Units Subcutaneous Q4H  . methylPREDNISolone (SOLU-MEDROL) injection  40 mg Intravenous Q12H  . sodium chloride flush  3 mL Intravenous Q12H   I have reviewed the patient's current medications.  ASSESSMENT: Principal Problem:   COVID-19 Active Problems:   Language barrier affecting health care   Late prenatal care affecting pregnancy   Sepsis associated hypotension (HCC)   Acute respiratory failure with hypoxia (HCC)   [redacted] weeks gestation of pregnancy   PLAN: Per primary team, on remdesivir if tolerates without continued increases in LFT's, currently trending up  O2 support prn Fetal well being is ok-->will arrange outpatient f/u--have decreased to daily NST w/ RROB  Alicia Boresanya S Kylian Loh, MD 08/12/2018,9:52 AM

## 2018-08-12 NOTE — Progress Notes (Signed)
NAMTamela Riley:  Alicia Riley, MRN:  [id number], DOB:  [birth year], LOS: 10 ADMISSION DATE:  08/02/2018, CONSULTATION DATE:  08/05/18 REFERRING MD:  Dr. Jennette Riley , CHIEF COMPLAINT:  COVID-19 infection  Brief History   38 year old female who is in 3rd trimester of pregnancy admitted with respiratory symptoms and was found have COVID 19 pneumonia.  Transferred to ICU for increasing O2 needs.  No significant past medical history.  Significant Hospital Events   5/9 admit 5/12 transfer to ICu for worsening hypoxia 5/13 start solumedrol 5/14 off pressors 5/16 start remdesivir 5/18 transfer to floor  Consults:  OB/Gyn  Procedures:    Significant Diagnostic Tests:    Micro Data:  5/09 SARS Coronavirus 2 > positive 5/12 Blood > negative  Antimicrobials:  Azithromycin 5/12 > 5/17 Ceftraixone 5/12 > 5/19  Interim history/subjective:  Denies chest pain.  Cough better.  Breathing better.  Objective   Blood pressure (!) 104/59, pulse 61, temperature 98.9 F (37.2 C), temperature source Oral, resp. rate 20, height 5\' 3"  (1.6 m), weight 63.4 kg, last menstrual period 01/02/2018, SpO2 95 %, unknown if currently breastfeeding.     Intake/Output Summary (Last 24 hours) at 08/12/2018 1155 Last data filed at 08/12/2018 [id number] Gross per 24 hour  Intake 178 ml  Output 600 ml  Net -422 ml   Filed Weights   08/03/18 0006 08/05/18 1800  Weight: 65.1 kg 63.4 kg   Examination:  General - alert Eyes - pupils reactive ENT - no sinus tenderness, no stridor Cardiac - regular rate/rhythm, no murmur Chest - equal breath sounds b/l, no wheezing or rales Abdomen - soft, non tender, + bowel sounds, gravid uterus Extremities - no cyanosis, clubbing, or edema Skin - no rashes Neuro - normal strength, moves extremities, follows commands   Resolved Hospital Problem list   Septic shock  Assessment & Plan:   Acute hypoxic respiratory failure from COVID pneumonia. Plan - day 4/5 of remdesivir >> f/u LFTs -  oxygen to keep SpO2 > 95% per Ob - f/u CXR as needed - prn BDs - wean off prednisone as tolerated - continue lovenox 40 mg q12h for now  Third trimester pregnancy. Plan - Ob/Gyn following  Steroid induced hyperglycemia. Plan - SSI  Best practice:  Diet: Regular  DVT prophylaxis: lovenox  GI prophylaxis: not indicated Mobility: activity as tolerated Code Status: full Disposition: med-surg  Labs    CMP Latest Ref Rng & Units 08/12/2018 08/11/2018 08/10/2018  Glucose 70 - 99 mg/dL 84 119(J102(H) 478(G110(H)  BUN 6 - 20 mg/dL 12 14 11   Creatinine 0.44 - 1.00 mg/dL 9.56(O0.42(L) 1.30(Q0.38(L) 6.57(Q0.41(L)  Sodium 135 - 145 mmol/L 137 135 136  Potassium 3.5 - 5.1 mmol/L 3.7 4.2 4.1  Chloride 98 - 111 mmol/L 100 100 103  CO2 22 - 32 mmol/L 24 22 20(L)  Calcium 8.9 - 10.3 mg/dL 4.6(N8.6(L) 6.2(X8.7(L) 5.2(W8.6(L)  Total Protein 6.5 - 8.1 g/dL 6.5 6.9 6.6  Total Bilirubin 0.3 - 1.2 mg/dL 0.4 0.3 0.8  Alkaline Phos 38 - 126 U/L 93 104 108  AST 15 - 41 U/L 154(H) 79(H) 45(H)  ALT 0 - 44 U/L 155(H) 72(H) 38    Lab Results  Component Value Date   DDIMER 0.80 (H) 08/09/2018    Iron/TIBC/Ferritin/ %Sat    Component Value Date/Time   FERRITIN 40 08/09/2018 0342    CBC Latest Ref Rng & Units 08/12/2018 08/11/2018 08/10/2018  WBC 4.0 - 10.5 K/uL 4.6 5.2 5.2  Hemoglobin 12.0 -  15.0 g/dL 10.3(L) 10.5(L) 9.9(L)  Hematocrit 36.0 - 46.0 % 31.2(L) 31.6(L) 30.2(L)  Platelets 150 - 400 K/uL 231 216 186    CBG (last 3)  Recent Labs    08/12/18 0408 08/12/18 0826 08/12/18 1137  GLUCAP 87 104* 156*    Coralyn Helling, MD [facility] 08/12/2018, 12:07 PM

## 2018-08-13 LAB — CBC
HCT: 30.8 % — ABNORMAL LOW (ref 36.0–46.0)
Hemoglobin: 10.2 g/dL — ABNORMAL LOW (ref 12.0–15.0)
MCH: 29.7 pg (ref 26.0–34.0)
MCHC: 33.1 g/dL (ref 30.0–36.0)
MCV: 89.5 fL (ref 80.0–100.0)
Platelets: 250 10*3/uL (ref 150–400)
RBC: 3.44 MIL/uL — ABNORMAL LOW (ref 3.87–5.11)
RDW: 13.5 % (ref 11.5–15.5)
WBC: 5.2 10*3/uL (ref 4.0–10.5)
nRBC: 0.6 % — ABNORMAL HIGH (ref 0.0–0.2)

## 2018-08-13 LAB — COMPREHENSIVE METABOLIC PANEL
ALT: 236 U/L — ABNORMAL HIGH (ref 0–44)
AST: 199 U/L — ABNORMAL HIGH (ref 15–41)
Albumin: 2.7 g/dL — ABNORMAL LOW (ref 3.5–5.0)
Alkaline Phosphatase: 96 U/L (ref 38–126)
Anion gap: 12 (ref 5–15)
BUN: 13 mg/dL (ref 6–20)
CO2: 23 mmol/L (ref 22–32)
Calcium: 8.8 mg/dL — ABNORMAL LOW (ref 8.9–10.3)
Chloride: 102 mmol/L (ref 98–111)
Creatinine, Ser: 0.45 mg/dL (ref 0.44–1.00)
GFR calc Af Amer: >60 mL/min (ref >60)
GFR calc non Af Amer: >60 mL/min (ref >60)
Glucose, Bld: 93 mg/dL (ref 70–99)
Potassium: 3.6 mmol/L (ref 3.5–5.1)
Sodium: 137 mmol/L (ref 135–145)
Total Bilirubin: 0.5 mg/dL (ref 0.3–1.2)
Total Protein: 6.4 g/dL — ABNORMAL LOW (ref 6.5–8.1)

## 2018-08-13 LAB — URINALYSIS, ROUTINE W REFLEX MICROSCOPIC
Bilirubin Urine: NEGATIVE
Glucose, UA: NEGATIVE mg/dL
Hgb urine dipstick: NEGATIVE
Ketones, ur: 20 mg/dL — AB
Nitrite: NEGATIVE
Protein, ur: NEGATIVE mg/dL
Specific Gravity, Urine: 1.018 (ref 1.005–1.030)
pH: 7 (ref 5.0–8.0)

## 2018-08-13 LAB — GLUCOSE, CAPILLARY
Glucose-Capillary: 103 mg/dL — ABNORMAL HIGH (ref 70–99)
Glucose-Capillary: 103 mg/dL — ABNORMAL HIGH (ref 70–99)
Glucose-Capillary: 119 mg/dL — ABNORMAL HIGH (ref 70–99)
Glucose-Capillary: 121 mg/dL — ABNORMAL HIGH (ref 70–99)
Glucose-Capillary: 122 mg/dL — ABNORMAL HIGH (ref 70–99)
Glucose-Capillary: 89 mg/dL (ref 70–99)

## 2018-08-13 LAB — PHOSPHORUS: Phosphorus: 2.9 mg/dL (ref 2.5–4.6)

## 2018-08-13 LAB — MAGNESIUM: Magnesium: 2 mg/dL (ref 1.7–2.4)

## 2018-08-13 MED ORDER — PREDNISONE 20 MG PO TABS
20.0000 mg | ORAL_TABLET | Freq: Two times a day (BID) | ORAL | Status: DC
  Administered 2018-08-14: 20 mg via ORAL
  Filled 2018-08-13 (×2): qty 1

## 2018-08-13 MED ORDER — PRENATAL MULTIVITAMIN CH
1.0000 | ORAL_TABLET | Freq: Every day | ORAL | Status: DC
  Administered 2018-08-14 – 2018-08-15 (×2): 1 via ORAL
  Filled 2018-08-13 (×4): qty 1

## 2018-08-13 MED ORDER — PREDNISONE 20 MG PO TABS
30.0000 mg | ORAL_TABLET | Freq: Two times a day (BID) | ORAL | Status: AC
  Administered 2018-08-13: 30 mg via ORAL
  Filled 2018-08-13: qty 1

## 2018-08-13 MED ORDER — PHENOL 1.4 % MT LIQD
1.0000 | OROMUCOSAL | Status: DC | PRN
  Filled 2018-08-13: qty 177

## 2018-08-13 NOTE — Progress Notes (Addendum)
Family Medicine Teaching Service Daily Progress Note Intern Pager: [phone]  Patient name: Alicia Riley Medical record number: [id number] Date of birth: [birth year] Age: 38 y.o. Gender: female  Primary Care Provider: Patient, No Pcp Per Consultants: CCM, OB/GYN Code Status: FUll Emergency Contact: "Om-lah" [phone]   Pt Overview and Major Events to Date:  5/9 admitted to FPTS, COVID positive 5/12 Transferred to ICU due to worsening respiratory status 5/18 Transferred to floor 5/20 Remdesivir discontniued  Antibiotic Course:  CTX 5/12-5/19 Azithromycin 5/12-5/17  Assessment and Plan: Alicia OddiMai Abbigayle is 31aG4P2 38 y.o. female at 724w4d who presented w/ COVID-19 transferred from ICU on 5/18. No pmhx  COVID-19 Positive - Day 12 of illness Patient transferred from ICU on 5/18. S/p CTX and Azithromycin for questionable PNA.  Episodes of hypotension in 80's/50's, MAP 68, improved this AM to 100/60. Has remained Afebrile. Maintained good O2 sats on room air most of the night but required 1L O2 this AM. CBC WNL. Electrolytes WNL. UA yesterday concerning for asymptomatic bacteremia. Urine culture pending. AST and ALT levels offically 5x upper limit normal -  (AST 79>154>199, ALT [phone]>155>236). Patient denies any SOB or cough any longer, but does endorse mild sore throat. Tolerating PO, denies any nausea, vomiting. Able to ambulate well around room without oxygen without SOB.  - s/p CTX and Azithromycin - Discontinue Remdesivir given LFT's - decrease to Prednisone 30mg  BID - Chloraseptic spray for throat - daily LFT's, CBC, UA, CMP - continue Lovenox SQ BID  - follow up urine culture, treat if indicated - Oxygen PRN, Maintain O2 >95%, wean O2 as tolerated - replace electrolytes as needed - contact/droplet precautions - tylenol q8hr PO - Robitussin DM q4h PRN - Combivent 1 puff q6 PRN for wheezing  - avoid NSAIDs - k-pad - monitor O2 sats q2 hr, vitals q2 - stricts I/Os - incentive spirometry - plan  [facility]  G4P2 @ 804w4d Reports good fetal movement. Denies any fluid or blood loss. Denies any contractions.  - daily prenatal vitamin - NST QD - have anatomy ultrasound appointment is scheduled for 5/21   Hyperglycemia: well controlled CBG 80-100's. 2/2 to steroid.  - rSSI - CBG's q4  Elevated Transaminases: AST 79>154, ALT 72>155. Likely secondary to Remdesivir.  - Continue to monitor  - Discontinue Remdesivir given LFT's - daily LFP  FEN/GI: Regular diet PPx: Lovenox  Disposition: Once able to wean off O2  Subjective:  Patient reports doing well this AM. Denies any SOB or cough, just mild sore throat. Feels a lot better without any SOB when walking. Notes family is doing well. Her other kids have not had any sick symptoms. Husband got tested - tested positive. But he has not had any symptoms. She notes he has been staying home from work. She notes she does not have a PCP, but [facility] for [facility] care. Patient would like to be seen in our [facility] for hospital follow-up.   Objective: Temp:  [97 F (36.1 C)-98.9 F (37.2 C)] 97 F (36.1 C) (05/20 0359) Pulse Rate:  [59-62] 59 (05/20 0359) Resp:  [24] 24 (05/20 0359) BP: (88-104)/(58-67) 98/67 (05/20 0359) SpO2:  [98 %-99 %] 98 % (05/20 0359) Physical Exam: Performed by attending physician Gen no respiratory distress off O2  No tachypnea Lungs scattered coarse rhonchi Cardiac RRR without m or g Abd benign with gravid uterus. Laboratory: Recent Labs  Lab 08/11/18 [phone] 08/12/18 [id number] 08/13/18 [id number]  WBC 5.2 4.6 5.2  HGB 10.5* 10.3* 10.2*  HCT 31.6* 31.2* 30.8*  PLT 216 231 250   Recent Labs  Lab 08/11/18 0420 08/12/18 0452 08/13/18 0551  NA 135 137 137  K 4.2 3.7 3.6  CL 100 100 102  CO2 22 24 23   BUN 14 12 13   CREATININE 0.38* 0.42* 0.45  CALCIUM 8.7* 8.6* 8.8*  PROT 6.9 6.5 6.4*  BILITOT 0.3 0.4 0.5  ALKPHOS 104 93 96  ALT 72*  155* 236*  AST 79* 154* 199*  GLUCOSE 102* 84 93    COVID positive LA 0.8>0.7>0.5 Procalcitonin 0.20>0.22>0.16 D-dimer: 1.48>0.80>0.80 CRP 11.8>9.2>8.0 LDH 171>174 Ferritin 40>40 Troponin: <0.03, <0.03 BNP 60.4  Urinalysis    Component Value Date/Time   COLORURINE YELLOW 08/12/2018 0533   APPEARANCEUR CLEAR 08/12/2018 0533   LABSPEC 1.014 08/12/2018 0533   PHURINE 6.0 08/12/2018 0533   GLUCOSEU NEGATIVE 08/12/2018 0533   HGBUR SMALL (A) 08/12/2018 0533   BILIRUBINUR NEGATIVE 08/12/2018 0533   KETONESUR NEGATIVE 08/12/2018 0533   PROTEINUR NEGATIVE 08/12/2018 0533   UROBILINOGEN 0.2 07/23/2013 1533   NITRITE NEGATIVE 08/12/2018 0533   LEUKOCYTESUR LARGE (A) 08/12/2018 0533    Imaging/Diagnostic Tests: No results found.  Orpah Cobb [place], DO 08/13/2018, 7:24 AM PGY-1, [facility] FPTS Intern pager: [phone], text pages welcome  I saw and examined this patient.  I discussed with the full resident team in rounds.  I agree with the documentation and management of Dr. Mauri Reading.  I anticipate DC tomorrow should resp status remain stable.

## 2018-08-13 NOTE — Progress Notes (Signed)
FACULTY PRACTICE ANTEPARTUM(COMPREHENSIVE) NOTE  Alicia Riley is a 38 y.o. [id number] at 339w6d by best clinical estimate who is admitted for COVID.   Fetal presentation is unsure. Length of Stay:  11  Days  Subjective: Feels better, now off O2--possible d/c tomorrow.  Patient reports the fetal movement as active. Patient reports uterine contraction  activity as none. Patient reports  vaginal bleeding as none. Patient describes fluid per vagina as None.  Vitals:  Blood pressure 93/66, pulse 73, temperature 98.4 F (36.9 C), temperature source Oral, resp. rate (!) 25, height 5\' 3"  (1.6 m), weight 63.4 kg, last menstrual period 01/02/2018, SpO2 98 %, unknown if currently breastfeeding. Physical Examination:  General appearance - alert, well appearing, and in no distress Chest - normal effort Abdomen - gravid, non-tender Fundal Height:  size equals dates Extremities: Homans sign is negative, no sign of DVT  Membranes:intact  Fetal Monitoring: reactive per RROB  Labs:  Results for orders placed or performed during the hospital encounter of 08/02/18 (from the past 24 hour(s))  Glucose, capillary   Collection Time: 08/12/18  8:44 PM  Result Value Ref Range   Glucose-Capillary 123 (H) 70 - 99 mg/dL  Glucose, capillary   Collection Time: 08/12/18 11:48 PM  Result Value Ref Range   Glucose-Capillary 99 70 - 99 mg/dL  Glucose, capillary   Collection Time: 08/13/18  3:57 AM  Result Value Ref Range   Glucose-Capillary 89 70 - 99 mg/dL  CBC   Collection Time: 08/13/18  5:51 AM  Result Value Ref Range   WBC 5.2 4.0 - 10.5 K/uL   RBC 3.44 (L) 3.87 - 5.11 MIL/uL   Hemoglobin 10.2 (L) 12.0 - 15.0 g/dL   HCT 45.430.8 (L) 09.836.0 - 11.946.0 %   MCV 89.5 80.0 - 100.0 fL   MCH 29.7 26.0 - 34.0 pg   MCHC 33.1 30.0 - 36.0 g/dL   RDW 14.713.5 82.911.5 - 56.215.5 %   Platelets 250 150 - 400 K/uL   nRBC 0.6 (H) 0.0 - 0.2 %  Comprehensive metabolic panel   Collection Time: 08/13/18  5:51 AM  Result Value Ref Range    Sodium 137 135 - 145 mmol/L   Potassium 3.6 3.5 - 5.1 mmol/L   Chloride 102 98 - 111 mmol/L   CO2 23 22 - 32 mmol/L   Glucose, Bld 93 70 - 99 mg/dL   BUN 13 6 - 20 mg/dL   Creatinine, Ser 1.300.45 0.44 - 1.00 mg/dL   Calcium 8.8 (L) 8.9 - 10.3 mg/dL   Total Protein 6.4 (L) 6.5 - 8.1 g/dL   Albumin 2.7 (L) 3.5 - 5.0 g/dL   AST 865199 (H) 15 - 41 U/L   ALT 236 (H) 0 - 44 U/L   Alkaline Phosphatase 96 38 - 126 U/L   Total Bilirubin 0.5 0.3 - 1.2 mg/dL   GFR calc non Af Amer >60 >60 mL/min   GFR calc Af Amer >60 >60 mL/min   Anion gap 12 5 - 15  Magnesium   Collection Time: 08/13/18  5:51 AM  Result Value Ref Range   Magnesium 2.0 1.7 - 2.4 mg/dL  Phosphorus   Collection Time: 08/13/18  5:51 AM  Result Value Ref Range   Phosphorus 2.9 2.5 - 4.6 mg/dL  Urinalysis, Routine w reflex microscopic   Collection Time: 08/13/18  7:05 AM  Result Value Ref Range   Color, Urine YELLOW YELLOW   APPearance HAZY (A) CLEAR   Specific Gravity, Urine 1.018 1.005 -  1.030   pH 7.0 5.0 - 8.0   Glucose, UA NEGATIVE NEGATIVE mg/dL   Hgb urine dipstick NEGATIVE NEGATIVE   Bilirubin Urine NEGATIVE NEGATIVE   Ketones, ur 20 (A) NEGATIVE mg/dL   Protein, ur NEGATIVE NEGATIVE mg/dL   Nitrite NEGATIVE NEGATIVE   Leukocytes,Ua MODERATE (A) NEGATIVE   RBC / HPF 0-5 0 - 5 RBC/hpf   WBC, UA 11-20 0 - 5 WBC/hpf   Bacteria, UA RARE (A) NONE SEEN   Squamous Epithelial / LPF 11-20 0 - 5   Mucus PRESENT    Non Squamous Epithelial 0-5 (A) NONE SEEN  Glucose, capillary   Collection Time: 08/13/18  8:30 AM  Result Value Ref Range   Glucose-Capillary 119 (H) 70 - 99 mg/dL  Glucose, capillary   Collection Time: 08/13/18 11:43 AM  Result Value Ref Range   Glucose-Capillary 122 (H) 70 - 99 mg/dL    Medications:  Scheduled . acetaminophen  650 mg Oral Q8H  . Chlorhexidine Gluconate Cloth  6 each Topical Daily  . enoxaparin (LOVENOX) injection  40 mg Subcutaneous Q12H  . insulin aspart  3-9 Units Subcutaneous Q4H   . [START ON 08/14/2018] predniSONE  20 mg Oral BID WC  . predniSONE  30 mg Oral BID WC  . sodium chloride flush  3 mL Intravenous Q12H   I have reviewed the patient's current medications.  ASSESSMENT: Principal Problem:   COVID-19 Active Problems:   Language barrier affecting health care   Late prenatal care affecting pregnancy   Sepsis associated hypotension (HCC)   Acute respiratory failure with hypoxia (HCC)   [redacted] weeks gestation of pregnancy   PLAN: Ok for DC per primary team tomorrow.  Continue current care. Remdesivir had to be stopped due to increasing LFT's. I have arranged outpt. F/u for her, should be on her AVS We will see her virtually at 32, 34 weeks and in person at 36 wks.  Reva Bores, MD 08/13/2018,4:47 PM

## 2018-08-13 NOTE — Progress Notes (Addendum)
Family Medicine Teaching Service Daily Progress Note Intern Pager: [phone]  Patient name: Alicia Riley Medical record number: [id number] Date of birth: 18-May-1980 Age: 38 y.o. Gender: female  Primary Care Provider: Patient, No Pcp Per Consultants: CCM, OB/GYN Code Status: FUll Emergency Contact: "Om-lah" [phone]   Pt Overview and Major Events to Date:  5/9 admitted to FPTS, COVID positive 5/12 Transferred to ICU due to worsening respiratory status 5/18 Transferred to floor 5/20 Remdesivir discontniued  Antibiotic Course:  CTX 5/12-5/19 Azithromycin 5/12-5/17  Assessment and Plan: Alicia Riley is [id number] 38 y.o. female at [redacted]w[redacted]d who presented w/ COVID-19 transferred from ICU on 5/18. No pmhx  COVID-19 Positive - Day 14 of illness Patient continuing to improve daily. Able to be weaned from oxygen overnight, however appears O2 sats in low 90's. O2 sat goal >95%. Has remained hemodynamically stable and afebrile ON. CBC WNL. Electrolytes WNL. UA WNL. Denies any SOB. Tolerating PO, denies any nausea, vomiting. Able to ambulate well around room without oxygen without SOB. AST/ALT stable s/p discontinuation of Remdesivir yesterday.  - S/p CTX, Azithromycin and Remdesivir - Continue pred taper: begin Prednisone 20mg  BID today, 20mg  QD tomorrow, 10mg  QD 5/23, then stop. - continue chloreseptic spray for throat PRN - daily LFT's, CBC, UA, CMP - continue Lovenox SQ BID  - Oxygen PRN, Maintain O2 >95%, wean O2 as tolerated - ambulate with pulse ox - given amount of fluids during admission, consider lasix if continues to sat low on room air or develops worsening SOB - Per CCM, check CRP and ferritin, signed off - replace electrolytes as needed - contact/droplet precautions - tylenol q8hr PO - Robitussin DM q4h PRN - Combivent 1 puff q6 PRN for wheezing  - avoid NSAIDs - k-pad - monitor O2 sats q2 hr, vitals q2 - stricts I/Os - incentive spirometry - patient scheduled for virtual follow up  visit with [facility] on [facility]1  Constipation: Several days without bowel movement.  - recommend prune juice - Miralax PRN   G4P2 @ [redacted]w[redacted]d Reports good fetal movement. Denies any fluid or blood loss. Denies any contractions.  - daily prenatal vitamin - NST QD - OB/GYN to schedule follow up upon discharge:: virtual visit at 32, 34, and 36 weeks - Per OB, no need for repeat COVID test prior to discharge given she will be retested regardless prior to delivery  Hyperglycemia: well controlled CBG 80-100's. 2/2 to steroid.  - rSSI - CBG's q4  Elevated Transaminases: AST 154>199>194, ALT 155>236>271. Likely secondary to Remdesivir.  - Continue to monitor  - daily LFP - follow up Hep C  FEN/GI: Regular diet PPx: Lovenox  Disposition: Once able to wean off O2  Subjective:  Patient reports doing well this AM. Denies any SOB, cough, abdominal pain, nausea, vomiting, diarrhea. Does note some constipation, last bowel movement has been several days.  Discussed follow up appointments with patient.   Objective: Temp:  [97.1 F (36.2 C)-98.5 F (36.9 C)] 97.1 F (36.2 C) (05/21 0800) Pulse Rate:  [60-74] 69 (05/21 0800) Resp:  [21-28] 21 (05/21 0800) BP: (90-104)/(52-68) 90/63 (05/21 0800) SpO2:  [92 %-98 %] 93 % (05/21 0800)   Physical Exam: Performed by attending physician   Laboratory: Recent Labs  Lab 08/12/18 0452 08/13/18 0551 08/14/18 0356  WBC 4.6 5.2 6.5  HGB 10.3* 10.2* 10.0*  HCT 31.2* 30.8* 30.7*  PLT 231 250 265   Recent Labs  Lab 08/12/18 0452 08/13/18 0551 08/14/18 0356  NA 137 137 137  K 3.7 3.6 4.0  CL 100 102 105  CO2 24 23 23   BUN 12 13 12   CREATININE 0.42* 0.45 0.39*  CALCIUM 8.6* 8.8* 8.6*  PROT 6.5 6.4* 6.1*  BILITOT 0.4 0.5 0.4  ALKPHOS 93 96 98  ALT 155* 236* 271*  AST 154* 199* 194*  GLUCOSE 84 93 102*    COVID positive LA 0.8>0.7>0.5 Procalcitonin 0.20>0.22>0.16 D-dimer: 1.48>0.80>0.80 CRP 11.8>9.2>8.0 LDH  171>174 Ferritin 40>40 Troponin: <0.03, <0.03 BNP 60.4  Urinalysis    Component Value Date/Time   COLORURINE YELLOW 08/14/2018 0423   APPEARANCEUR CLEAR 08/14/2018 0423   LABSPEC 1.015 08/14/2018 0423   PHURINE 6.0 08/14/2018 0423   GLUCOSEU NEGATIVE 08/14/2018 0423   HGBUR NEGATIVE 08/14/2018 0423   BILIRUBINUR NEGATIVE 08/14/2018 0423   KETONESUR NEGATIVE 08/14/2018 0423   PROTEINUR NEGATIVE 08/14/2018 0423   UROBILINOGEN 0.2 07/23/2013 1533   NITRITE NEGATIVE 08/14/2018 0423   LEUKOCYTESUR SMALL (A) 08/14/2018 0423    Imaging/Diagnostic Tests: No results found.  Joana ReamerMullis, Kiersten P, DO 08/14/2018, 9:34 AM PGY-1, [facility] FPTS Intern pager: [phone], text pages welcome

## 2018-08-14 ENCOUNTER — Encounter (HOSPITAL_COMMUNITY): Payer: Self-pay

## 2018-08-14 ENCOUNTER — Telehealth: Payer: Self-pay | Admitting: *Deleted

## 2018-08-14 ENCOUNTER — Ambulatory Visit (HOSPITAL_COMMUNITY): Payer: Medicaid Other

## 2018-08-14 DIAGNOSIS — R069 Unspecified abnormalities of breathing: Secondary | ICD-10-CM

## 2018-08-14 DIAGNOSIS — K5900 Constipation, unspecified: Secondary | ICD-10-CM

## 2018-08-14 DIAGNOSIS — Z3480 Encounter for supervision of other normal pregnancy, unspecified trimester: Secondary | ICD-10-CM

## 2018-08-14 DIAGNOSIS — Z3A32 32 weeks gestation of pregnancy: Secondary | ICD-10-CM

## 2018-08-14 LAB — URINALYSIS, ROUTINE W REFLEX MICROSCOPIC
Bacteria, UA: NONE SEEN
Bilirubin Urine: NEGATIVE
Glucose, UA: NEGATIVE mg/dL
Hgb urine dipstick: NEGATIVE
Ketones, ur: NEGATIVE mg/dL
Nitrite: NEGATIVE
Protein, ur: NEGATIVE mg/dL
Specific Gravity, Urine: 1.015 (ref 1.005–1.030)
pH: 6 (ref 5.0–8.0)

## 2018-08-14 LAB — CULTURE, OB URINE: Culture: NO GROWTH

## 2018-08-14 LAB — COMPREHENSIVE METABOLIC PANEL
ALT: 271 U/L — ABNORMAL HIGH (ref 0–44)
AST: 194 U/L — ABNORMAL HIGH (ref 15–41)
Albumin: 2.7 g/dL — ABNORMAL LOW (ref 3.5–5.0)
Alkaline Phosphatase: 98 U/L (ref 38–126)
Anion gap: 9 (ref 5–15)
BUN: 12 mg/dL (ref 6–20)
CO2: 23 mmol/L (ref 22–32)
Calcium: 8.6 mg/dL — ABNORMAL LOW (ref 8.9–10.3)
Chloride: 105 mmol/L (ref 98–111)
Creatinine, Ser: 0.39 mg/dL — ABNORMAL LOW (ref 0.44–1.00)
GFR calc Af Amer: >60 mL/min (ref >60)
GFR calc non Af Amer: >60 mL/min (ref >60)
Glucose, Bld: 102 mg/dL — ABNORMAL HIGH (ref 70–99)
Potassium: 4 mmol/L (ref 3.5–5.1)
Sodium: 137 mmol/L (ref 135–145)
Total Bilirubin: 0.4 mg/dL (ref 0.3–1.2)
Total Protein: 6.1 g/dL — ABNORMAL LOW (ref 6.5–8.1)

## 2018-08-14 LAB — CBC
HCT: 30.7 % — ABNORMAL LOW (ref 36.0–46.0)
Hemoglobin: 10 g/dL — ABNORMAL LOW (ref 12.0–15.0)
MCH: 29.2 pg (ref 26.0–34.0)
MCHC: 32.6 g/dL (ref 30.0–36.0)
MCV: 89.8 fL (ref 80.0–100.0)
Platelets: 265 10*3/uL (ref 150–400)
RBC: 3.42 MIL/uL — ABNORMAL LOW (ref 3.87–5.11)
RDW: 13.4 % (ref 11.5–15.5)
WBC: 6.5 10*3/uL (ref 4.0–10.5)
nRBC: 0 % (ref 0.0–0.2)

## 2018-08-14 LAB — GLUCOSE, CAPILLARY
Glucose-Capillary: 106 mg/dL — ABNORMAL HIGH (ref 70–99)
Glucose-Capillary: 121 mg/dL — ABNORMAL HIGH (ref 70–99)
Glucose-Capillary: 137 mg/dL — ABNORMAL HIGH (ref 70–99)
Glucose-Capillary: 85 mg/dL (ref 70–99)
Glucose-Capillary: 96 mg/dL (ref 70–99)

## 2018-08-14 LAB — C-REACTIVE PROTEIN: CRP: 1 mg/dL — ABNORMAL HIGH (ref <1.0)

## 2018-08-14 MED ORDER — PREDNISONE 20 MG PO TABS
20.0000 mg | ORAL_TABLET | Freq: Two times a day (BID) | ORAL | Status: AC
  Administered 2018-08-14: 20 mg via ORAL
  Filled 2018-08-14: qty 1

## 2018-08-14 MED ORDER — PREDNISONE 10 MG PO TABS
10.0000 mg | ORAL_TABLET | Freq: Once | ORAL | Status: AC
  Administered 2018-08-15: 10 mg via ORAL
  Filled 2018-08-14: qty 1

## 2018-08-14 MED ORDER — PREDNISONE 10 MG PO TABS
10.0000 mg | ORAL_TABLET | Freq: Two times a day (BID) | ORAL | Status: DC
  Administered 2018-08-15: 10 mg via ORAL
  Filled 2018-08-14: qty 1

## 2018-08-14 MED ORDER — AMBULATORY NON FORMULARY MEDICATION: 1.0000 | 0 refills | Status: DC

## 2018-08-14 NOTE — Progress Notes (Signed)
Paged Dr.Olsen regarding clarification of O2 saturation goals. He stated >92% is her goal. Removed 2L and is saturating 93% on RA.

## 2018-08-14 NOTE — Progress Notes (Signed)
Pt o2 sats while ambulating 96% room air.  After a few minutes sitting down patient oxygen at 94% room air. Placed pt on 1 L [place] and satting 97%.  Lungs clear/diminished, breathing unlabored & symmetrical. No complaints from patient and patient not appearing to be in any distress.Will continue to monitor.

## 2018-08-14 NOTE — Progress Notes (Signed)
NST reactive and reassuring.  Dr Gildardo Griffes at bedside for assessment during NST.  Reviewed strip with this RN.

## 2018-08-14 NOTE — Progress Notes (Addendum)
Paged Dr. Constance Goltz, "Do we want to consider d/c scheduled tylenol due to elevated LFTs? Afebrile for past 4 days."  Tylenol D/Cd

## 2018-08-14 NOTE — Telephone Encounter (Signed)
-----   Message from Darrel Hoover, RN sent at 08/14/2018 11:44 AM EDT ----- She was + covid.  Very sick.  Discharged from hospital yesterday.  Would you please see that this is taken care of today? ----- Message ----- From: Reva Bores, MD Sent: 08/13/2018   4:45 PM EDT To: Darrel Hoover, RN, [facility] Admin Pool  As discussed, she is being dc'd today, needs BP cuff and virtual visit at 32 wks and 34 wks, next week, and then in person at 36 wks--speaks some English,  and may need [place]ese interpreter.

## 2018-08-14 NOTE — Progress Notes (Signed)
MD called stating patient's o2 sats needed to be atleast 95% and requesting walking 02 eval.  RN did o2 eval while ambulating patient on room air- pt sats 96%.  After a few minutes sitting down patient oxygen 93%, placed pt on 2L [place] to get pt to 96%.

## 2018-08-14 NOTE — Progress Notes (Signed)
FACULTY PRACTICE ANTEPARTUM(COMPREHENSIVE) NOTE  Alicia Riley is a 38 y.o. [id number] at 8274w0d by best clinical estimate who is admitted for COVID.   Fetal presentation is unsure. Length of Stay:  12  Days  Subjective: Feels well. Increased oxygen requirement overnight. Patient reports the fetal movement as active. Patient reports uterine contraction  activity as none. Patient reports  vaginal bleeding as none. Patient describes fluid per vagina as None.  Vitals:  Blood pressure 98/63, pulse 66, temperature (!) 96.5 F (35.8 C), temperature source Oral, resp. rate (!) 21, height 5\' 3"  (1.6 m), weight 63.4 kg, last menstrual period 01/02/2018, SpO2 95 %, unknown if currently breastfeeding. Physical Examination:  General appearance - alert, well appearing, and in no distress Chest - normal effort Abdomen - gravid, non-tender Fundal Height:  size equals dates Extremities: Homans sign is negative, no sign of DVT  Membranes:intact  Fetal Monitoring:  Baseline: 145 bpm, Variability: Good {> 6 bpm), Accelerations: Reactive and Decelerations: Absent  Labs:  Results for orders placed or performed during the hospital encounter of 08/02/18 (from the past 24 hour(s))  Glucose, capillary   Collection Time: 08/13/18  4:51 PM  Result Value Ref Range   Glucose-Capillary 103 (H) 70 - 99 mg/dL  Glucose, capillary   Collection Time: 08/13/18  8:06 PM  Result Value Ref Range   Glucose-Capillary 103 (H) 70 - 99 mg/dL  Glucose, capillary   Collection Time: 08/13/18 11:55 PM  Result Value Ref Range   Glucose-Capillary 121 (H) 70 - 99 mg/dL  CBC   Collection Time: 08/14/18  3:56 AM  Result Value Ref Range   WBC 6.5 4.0 - 10.5 K/uL   RBC 3.42 (L) 3.87 - 5.11 MIL/uL   Hemoglobin 10.0 (L) 12.0 - 15.0 g/dL   HCT 14.730.7 (L) 82.936.0 - 56.246.0 %   MCV 89.8 80.0 - 100.0 fL   MCH 29.2 26.0 - 34.0 pg   MCHC 32.6 30.0 - 36.0 g/dL   RDW 13.013.4 86.511.5 - 78.415.5 %   Platelets 265 150 - 400 K/uL   nRBC 0.0 0.0 - 0.2 %   Comprehensive metabolic panel   Collection Time: 08/14/18  3:56 AM  Result Value Ref Range   Sodium 137 135 - 145 mmol/L   Potassium 4.0 3.5 - 5.1 mmol/L   Chloride 105 98 - 111 mmol/L   CO2 23 22 - 32 mmol/L   Glucose, Bld 102 (H) 70 - 99 mg/dL   BUN 12 6 - 20 mg/dL   Creatinine, Ser 6.960.39 (L) 0.44 - 1.00 mg/dL   Calcium 8.6 (L) 8.9 - 10.3 mg/dL   Total Protein 6.1 (L) 6.5 - 8.1 g/dL   Albumin 2.7 (L) 3.5 - 5.0 g/dL   AST 295194 (H) 15 - 41 U/L   ALT 271 (H) 0 - 44 U/L   Alkaline Phosphatase 98 38 - 126 U/L   Total Bilirubin 0.4 0.3 - 1.2 mg/dL   GFR calc non Af Amer >60 >60 mL/min   GFR calc Af Amer >60 >60 mL/min   Anion gap 9 5 - 15  Glucose, capillary   Collection Time: 08/14/18  4:04 AM  Result Value Ref Range   Glucose-Capillary 96 70 - 99 mg/dL  Urinalysis, Routine w reflex microscopic   Collection Time: 08/14/18  4:23 AM  Result Value Ref Range   Color, Urine YELLOW YELLOW   APPearance CLEAR CLEAR   Specific Gravity, Urine 1.015 1.005 - 1.030   pH 6.0 5.0 - 8.0  Glucose, UA NEGATIVE NEGATIVE mg/dL   Hgb urine dipstick NEGATIVE NEGATIVE   Bilirubin Urine NEGATIVE NEGATIVE   Ketones, ur NEGATIVE NEGATIVE mg/dL   Protein, ur NEGATIVE NEGATIVE mg/dL   Nitrite NEGATIVE NEGATIVE   Leukocytes,Ua SMALL (A) NEGATIVE   RBC / HPF 0-5 0 - 5 RBC/hpf   WBC, UA 6-10 0 - 5 WBC/hpf   Bacteria, UA NONE SEEN NONE SEEN   Squamous Epithelial / LPF 0-5 0 - 5   Mucus PRESENT   Glucose, capillary   Collection Time: 08/14/18  8:10 AM  Result Value Ref Range   Glucose-Capillary 85 70 - 99 mg/dL  C-reactive protein   Collection Time: 08/14/18 11:13 AM  Result Value Ref Range   CRP 1.0 (H) <1.0 mg/dL  Glucose, capillary   Collection Time: 08/14/18 11:31 AM  Result Value Ref Range   Glucose-Capillary 121 (H) 70 - 99 mg/dL    Medications:  Scheduled . Chlorhexidine Gluconate Cloth  6 each Topical Daily  . enoxaparin (LOVENOX) injection  40 mg Subcutaneous Q12H  . insulin aspart   3-9 Units Subcutaneous Q4H  . [START ON 08/15/2018] predniSONE  10 mg Oral BID WC  . predniSONE  20 mg Oral BID WC  . prenatal multivitamin  1 tablet Oral Q1200  . sodium chloride flush  3 mL Intravenous Q12H   I have reviewed the patient's current medications.  ASSESSMENT: Principal Problem:   COVID-19 Active Problems:   Language barrier affecting health care   Late prenatal care affecting pregnancy   Sepsis associated hypotension (HCC)   Acute respiratory failure with hypoxia (HCC)   [redacted] weeks gestation of pregnancy   Respiration abnormal   Constipation   PLAN: Continue inpt. until no longer needs O2 I spoke to MFM today and they have stated that if maternal well-being is ok, and NST is reactive, there is not an exact level of oxygen saturation, mom has to stay at for discharge. Her NST is beautiful today. Fetal hemoglobin binds oxygen better than adult hgb and baby shows no signs of hypoxia.   Reva Bores, MD 08/14/2018,12:55 PM

## 2018-08-14 NOTE — Progress Notes (Signed)
Chart reviewed = patient on RA  D/w resident - FPTS  - check CRP/ ferritin 08/15/18  - ccm will sign off     SIGNATURE    Dr. Kalman Shan, M.D., F.C.C.P,  Pulmonary and Critical Care Medicine Staff Physician, [facility] Center Director - Interstitial Lung Disease  Program  Pulmonary Fibrosis [facility] [place], [place], [address]  Pager: [phone], If no answer or between  15:00h - 7:00h: call [phone] Telephone: [phone]  11:04 AM 08/14/2018

## 2018-08-14 NOTE — Progress Notes (Signed)
Monitors applied for daily NST.  Pt sitting in chair relaxing.  No complaints of cxns, leaking or bleeding.  Abdomen soft and nontender.  On 2L [place] with Sats 96%.

## 2018-08-14 NOTE — Progress Notes (Signed)
Pt slightly hypoxic on room air while sleeping this morning. Oxygen sats 88-89% at times.  After waking patient's oxygen increased to 93% room air. Will continue to monitor.

## 2018-08-14 NOTE — Telephone Encounter (Signed)
Called pt to inform her that an order for a blood pressure cuff was placed and will be sent to her house.  Advised her that she will also be scheduled for virtual visits starting next week as she is 32 weeks, then at 34 weeks, and then when she is 36 weeks, she will come to the clinic so we can do her cultures. Message sent to front office staff to schedule. The number for listed for her home is not correct.  The person who picked up stated there was no one there by that name.  Dialed the mobile number and the husband answered, stating that the pt was still in the hospital and had not been discharged yet.  Requested that he have her contact the clinic once she is discharged to schedule follow up appointment.  He verbalized understanding.   Interpreter [phone] used during this call.

## 2018-08-15 DIAGNOSIS — K5901 Slow transit constipation: Secondary | ICD-10-CM

## 2018-08-15 LAB — URINALYSIS, ROUTINE W REFLEX MICROSCOPIC
Bacteria, UA: NONE SEEN
Bilirubin Urine: NEGATIVE
Glucose, UA: NEGATIVE mg/dL
Hgb urine dipstick: NEGATIVE
Ketones, ur: NEGATIVE mg/dL
Nitrite: NEGATIVE
Protein, ur: NEGATIVE mg/dL
Specific Gravity, Urine: 1.019 (ref 1.005–1.030)
pH: 6 (ref 5.0–8.0)

## 2018-08-15 LAB — CBC
HCT: 30.8 % — ABNORMAL LOW (ref 36.0–46.0)
Hemoglobin: 10.4 g/dL — ABNORMAL LOW (ref 12.0–15.0)
MCH: 29.8 pg (ref 26.0–34.0)
MCHC: 33.8 g/dL (ref 30.0–36.0)
MCV: 88.3 fL (ref 80.0–100.0)
Platelets: 260 10*3/uL (ref 150–400)
RBC: 3.49 MIL/uL — ABNORMAL LOW (ref 3.87–5.11)
RDW: 13.3 % (ref 11.5–15.5)
WBC: 7 10*3/uL (ref 4.0–10.5)
nRBC: 0.3 % — ABNORMAL HIGH (ref 0.0–0.2)

## 2018-08-15 LAB — HCV COMMENT:

## 2018-08-15 LAB — COMPREHENSIVE METABOLIC PANEL
ALT: 283 U/L — ABNORMAL HIGH (ref 0–44)
AST: 173 U/L — ABNORMAL HIGH (ref 15–41)
Albumin: 2.7 g/dL — ABNORMAL LOW (ref 3.5–5.0)
Alkaline Phosphatase: 87 U/L (ref 38–126)
Anion gap: 11 (ref 5–15)
BUN: 12 mg/dL (ref 6–20)
CO2: 23 mmol/L (ref 22–32)
Calcium: 8.8 mg/dL — ABNORMAL LOW (ref 8.9–10.3)
Chloride: 103 mmol/L (ref 98–111)
Creatinine, Ser: 0.45 mg/dL (ref 0.44–1.00)
GFR calc Af Amer: >60 mL/min (ref >60)
GFR calc non Af Amer: >60 mL/min (ref >60)
Glucose, Bld: 92 mg/dL (ref 70–99)
Potassium: 3.6 mmol/L (ref 3.5–5.1)
Sodium: 137 mmol/L (ref 135–145)
Total Bilirubin: 0.4 mg/dL (ref 0.3–1.2)
Total Protein: 6.4 g/dL — ABNORMAL LOW (ref 6.5–8.1)

## 2018-08-15 LAB — GLUCOSE, CAPILLARY
Glucose-Capillary: 91 mg/dL (ref 70–99)
Glucose-Capillary: 102 mg/dL — ABNORMAL HIGH (ref 70–99)
Glucose-Capillary: 93 mg/dL (ref 70–99)

## 2018-08-15 LAB — FERRITIN: Ferritin: 37 ng/mL (ref 11–307)

## 2018-08-15 LAB — HEPATITIS C ANTIBODY (REFLEX): HCV Ab: <0.1 s/co ratio (ref 0.0–0.9)

## 2018-08-15 MED ORDER — POLYETHYLENE GLYCOL 3350 17 G PO PACK: 17.0000 g | PACK | Freq: Every day | ORAL | 0 refills | Status: DC | PRN

## 2018-08-15 MED ORDER — PHENOL 1.4 % MT LIQD: 1.0000 | OROMUCOSAL | 0 refills | Status: DC | PRN

## 2018-08-15 MED ORDER — GUAIFENESIN-DM 100-10 MG/5ML PO SYRP: 5.0000 mL | ORAL_SOLUTION | ORAL | 0 refills | Status: DC | PRN

## 2018-08-15 MED ORDER — PREDNISONE 10 MG PO TABS: 10.0000 mg | ORAL_TABLET | Freq: Every day | ORAL | 0 refills | Status: AC

## 2018-08-15 MED ORDER — IPRATROPIUM-ALBUTEROL 20-100 MCG/ACT IN AERS: 1.0000 | INHALATION_SPRAY | Freq: Four times a day (QID) | RESPIRATORY_TRACT | 0 refills | Status: DC | PRN

## 2018-08-15 MED FILL — COMBIVENT RESPIMAT INHAL SP: 20-100 | 30 days supply | Qty: 4 | Fill #0

## 2018-08-15 MED FILL — predniSONE 10 MG TABS: 10 | 1 days supply | Qty: 1 | Fill #0

## 2018-08-15 NOTE — Progress Notes (Signed)
FACULTY PRACTICE ANTEPARTUM(COMPREHENSIVE) NOTE  Alicia Riley is a 38 y.o. [id number] at [redacted]w[redacted]d by best clinical estimate who is admitted for COVID 19.   Fetal presentation is unsure. Length of Stay:  13  Days  Subjective: Feels well, being d/c's today Patient reports the fetal movement as active. Patient reports uterine contraction  activity as none. Patient reports  vaginal bleeding as none. Patient describes fluid per vagina as None.  Vitals:  Blood pressure (!) 84/61, pulse 74, temperature 99.2 F (37.3 C), resp. rate (!) 27, height 5\' 3"  (1.6 m), weight 63.4 kg, last menstrual period 01/02/2018, SpO2 93 %, unknown if currently breastfeeding. Physical Examination:  General appearance - alert, well appearing, and in no distress Chest - normal effort Abdomen - gravid, non-tender Fundal Height:  size equals dates Extremities: extremities normal, atraumatic, no cyanosis or edema  Membranes:intact  Fetal Monitoring: reactive per RROB  Labs:  Results for orders placed or performed during the hospital encounter of 08/02/18 (from the past 24 hour(s))  Glucose, capillary   Collection Time: 08/14/18  8:25 PM  Result Value Ref Range   Glucose-Capillary 137 (H) 70 - 99 mg/dL  Glucose, capillary   Collection Time: 08/14/18 11:54 PM  Result Value Ref Range   Glucose-Capillary 106 (H) 70 - 99 mg/dL  Glucose, capillary   Collection Time: 08/15/18  4:32 AM  Result Value Ref Range   Glucose-Capillary 91 70 - 99 mg/dL  CBC   Collection Time: 08/15/18  5:58 AM  Result Value Ref Range   WBC 7.0 4.0 - 10.5 K/uL   RBC 3.49 (L) 3.87 - 5.11 MIL/uL   Hemoglobin 10.4 (L) 12.0 - 15.0 g/dL   HCT 61.2 (L) 24.4 - 97.5 %   MCV 88.3 80.0 - 100.0 fL   MCH 29.8 26.0 - 34.0 pg   MCHC 33.8 30.0 - 36.0 g/dL   RDW 30.0 51.1 - 02.1 %   Platelets 260 150 - 400 K/uL   nRBC 0.3 (H) 0.0 - 0.2 %  Comprehensive metabolic panel   Collection Time: 08/15/18  5:58 AM  Result Value Ref Range   Sodium 137 135 - 145  mmol/L   Potassium 3.6 3.5 - 5.1 mmol/L   Chloride 103 98 - 111 mmol/L   CO2 23 22 - 32 mmol/L   Glucose, Bld 92 70 - 99 mg/dL   BUN 12 6 - 20 mg/dL   Creatinine, Ser 1.17 0.44 - 1.00 mg/dL   Calcium 8.8 (L) 8.9 - 10.3 mg/dL   Total Protein 6.4 (L) 6.5 - 8.1 g/dL   Albumin 2.7 (L) 3.5 - 5.0 g/dL   AST 356 (H) 15 - 41 U/L   ALT 283 (H) 0 - 44 U/L   Alkaline Phosphatase 87 38 - 126 U/L   Total Bilirubin 0.4 0.3 - 1.2 mg/dL   GFR calc non Af Amer >60 >60 mL/min   GFR calc Af Amer >60 >60 mL/min   Anion gap 11 5 - 15  Ferritin   Collection Time: 08/15/18  5:58 AM  Result Value Ref Range   Ferritin 37 11 - 307 ng/mL  Glucose, capillary   Collection Time: 08/15/18  8:11 AM  Result Value Ref Range   Glucose-Capillary 102 (H) 70 - 99 mg/dL  Urinalysis, Routine w reflex microscopic   Collection Time: 08/15/18  8:29 AM  Result Value Ref Range   Color, Urine YELLOW YELLOW   APPearance CLEAR CLEAR   Specific Gravity, Urine 1.019 1.005 - 1.030  pH 6.0 5.0 - 8.0   Glucose, UA NEGATIVE NEGATIVE mg/dL   Hgb urine dipstick NEGATIVE NEGATIVE   Bilirubin Urine NEGATIVE NEGATIVE   Ketones, ur NEGATIVE NEGATIVE mg/dL   Protein, ur NEGATIVE NEGATIVE mg/dL   Nitrite NEGATIVE NEGATIVE   Leukocytes,Ua TRACE (A) NEGATIVE   RBC / HPF 0-5 0 - 5 RBC/hpf   WBC, UA 0-5 0 - 5 WBC/hpf   Bacteria, UA NONE SEEN NONE SEEN   Squamous Epithelial / LPF 0-5 0 - 5  Glucose, capillary   Collection Time: 08/15/18 12:36 PM  Result Value Ref Range   Glucose-Capillary 93 70 - 99 mg/dL   Comment 1 Call MD NNP PA CNM      Medications:  Scheduled . Chlorhexidine Gluconate Cloth  6 each Topical Daily  . enoxaparin (LOVENOX) injection  40 mg Subcutaneous Q12H  . insulin aspart  3-9 Units Subcutaneous Q4H  . predniSONE  10 mg Oral BID WC  . prenatal multivitamin  1 tablet Oral Q1200  . sodium chloride flush  3 mL Intravenous Q12H   I have reviewed the patient's current medications.  ASSESSMENT: Principal  Problem:   COVID-19 Active Problems:   Language barrier affecting health care   Late prenatal care affecting pregnancy   Sepsis associated hypotension (HCC)   Acute respiratory failure with hypoxia (HCC)   [redacted] weeks gestation of pregnancy   Respiration abnormal   Constipation   PLAN: Ok for DC--have arranged f/u for pt.  Has cuff coming to her home. Virtual visit next week.  Reva Boresanya S Renly Roots, MD 08/15/2018,4:06 PM

## 2018-08-15 NOTE — Progress Notes (Signed)
Pt is going to be discharged in the next few minutes.  Spot check FHR done.  Pt denies vaginal bleeding, LOF, or UC's and reports good fetal movement.

## 2018-08-15 NOTE — Discharge Instructions (Signed)
Were hospitalized at [facility] due to a serious viral infection COVID-19, required ICU admission.  You recovered very well.  You are so glad you are feeling better.  It will be important for you to remain home with appropriate's social distancing until all symptoms are resolved and you have followed up with your doctor.  You will need to finish your steroid taper which is called prednisone.  It will also be very important for you to follow-up with your OB/GYN at your scheduled times.

## 2018-08-22 ENCOUNTER — Other Ambulatory Visit: Payer: Self-pay

## 2018-08-22 ENCOUNTER — Ambulatory Visit (INDEPENDENT_AMBULATORY_CARE_PROVIDER_SITE_OTHER): Payer: Medicaid Other | Admitting: Obstetrics & Gynecology

## 2018-08-22 DIAGNOSIS — J988 Other specified respiratory disorders: Secondary | ICD-10-CM | POA: Diagnosis not present

## 2018-08-22 DIAGNOSIS — Z3483 Encounter for supervision of other normal pregnancy, third trimester: Secondary | ICD-10-CM

## 2018-08-22 DIAGNOSIS — Z3A33 33 weeks gestation of pregnancy: Secondary | ICD-10-CM

## 2018-08-22 DIAGNOSIS — Z789 Other specified health status: Secondary | ICD-10-CM | POA: Diagnosis not present

## 2018-08-22 DIAGNOSIS — U071 COVID-19: Secondary | ICD-10-CM

## 2018-08-22 DIAGNOSIS — Z3480 Encounter for supervision of other normal pregnancy, unspecified trimester: Secondary | ICD-10-CM

## 2018-08-22 NOTE — Progress Notes (Signed)
I connected with  Alicia Riley on 08/22/18 at  9:15 AM EDT by telephone and verified that I am speaking with the correct person using two identifiers.   I discussed the limitations, risks, security and privacy concerns of performing an evaluation and management service by telephone and the availability of in person appointments. I also discussed with the patient that there may be a patient responsible charge related to this service. The patient expressed understanding and agreed to proceed.  Alicia Riley, CMA 08/22/2018  9:26 AM

## 2018-08-22 NOTE — Progress Notes (Signed)
   TELEHEALTH VIRTUAL OBSTETRICS VISIT ENCOUNTER NOTE  I connected with Alicia Riley on 08/22/18 at  9:15 AM EDT by telephone at home and verified that I am speaking with the correct person using two identifiers.   I discussed the limitations, risks, security and privacy concerns of performing an evaluation and management service by telephone and the availability of in person appointments. I also discussed with the patient that there may be a patient responsible charge related to this service. The patient expressed understanding and agreed to proceed.  Subjective:  Alicia Riley is a 38 y.o. [phone] at [redacted]w[redacted]d being followed for ongoing prenatal care.  She is currently monitored for the following issues for this high-risk pregnancy and has Language barrier affecting health care; Cervical polyp; Supervision of other normal pregnancy, antepartum; Late prenatal care affecting pregnancy; COVID-19; Sepsis associated hypotension (HCC); Acute respiratory failure with hypoxia (HCC); [redacted] weeks gestation of pregnancy; Respiration abnormal; and Constipation on their problem list.  Patient reports no complaints. Reports fetal movement. Denies any contractions, bleeding or leaking of fluid.   The following portions of the patient's history were reviewed and updated as appropriate: allergies, current medications, past family history, past medical history, past social history, past surgical history and problem list.   Objective:   General:  Alert, oriented and cooperative.   Mental Status: Normal mood and affect perceived. Normal judgment and thought content.  Rest of physical exam deferred due to type of encounter  Assessment and Plan:  Pregnancy: [id number] at [redacted]w[redacted]d 1. Language barrier affecting health care - [place] line interpretor used for visit  2. Supervision of other normal pregnancy, antepartum - she does not have a BP cuff at home, but she plans to buy one in the near future  3. COVID-19 - home now after  stay in the hospital   Preterm labor symptoms and general obstetric precautions including but not limited to vaginal bleeding, contractions, leaking of fluid and fetal movement were reviewed in detail with the patient.  I discussed the assessment and treatment plan with the patient. The patient was provided an opportunity to ask questions and all were answered. The patient agreed with the plan and demonstrated an understanding of the instructions. The patient was advised to call back or seek an in-person office evaluation/go to MAU at [facility] for any urgent or concerning symptoms. Please refer to After Visit Summary for other counseling recommendations.   I provided 10 minutes of non-face-to-face time during this encounter.  All of her future visits were already scheduled. No follow-ups on file.  Future Appointments  Date Time Provider Department Center  08/25/2018  2:30 PM Marcine Matar, MD [facility] None  09/01/2018 10:55 AM Rolm Bookbinder, CNM [facility]  09/15/2018  1:15 PM Armando Reichert, CNM [facility]  09/22/2018 10:55 AM Armando Reichert, CNM [facility]  09/29/2018 10:55 AM Armando Reichert, CNM [facility]  10/06/2018 10:55 AM Armando Reichert, CNM [facility]  10/15/2018  9:15 AM Allie Bossier, MD [facility]  10/15/2018 10:15 AM [facility] NST [facility]OC    [place] Mckeough Inda Coke, MD Center for [employer], [facility]

## 2018-08-25 ENCOUNTER — Ambulatory Visit: Payer: Medicaid Other | Attending: Internal Medicine | Admitting: Internal Medicine

## 2018-08-25 ENCOUNTER — Other Ambulatory Visit: Payer: Self-pay

## 2018-08-25 DIAGNOSIS — R945 Abnormal results of liver function studies: Secondary | ICD-10-CM

## 2018-08-25 DIAGNOSIS — J1282 Pneumonia due to coronavirus disease 2019: Secondary | ICD-10-CM

## 2018-08-25 DIAGNOSIS — Z3493 Encounter for supervision of normal pregnancy, unspecified, third trimester: Secondary | ICD-10-CM

## 2018-08-25 DIAGNOSIS — J1289 Other viral pneumonia: Secondary | ICD-10-CM | POA: Diagnosis not present

## 2018-08-25 DIAGNOSIS — U071 COVID-19: Secondary | ICD-10-CM | POA: Diagnosis not present

## 2018-08-25 DIAGNOSIS — Z331 Pregnant state, incidental: Secondary | ICD-10-CM | POA: Diagnosis not present

## 2018-08-25 DIAGNOSIS — Z09 Encounter for follow-up examination after completed treatment for conditions other than malignant neoplasm: Secondary | ICD-10-CM

## 2018-08-25 DIAGNOSIS — Z3490 Encounter for supervision of normal pregnancy, unspecified, unspecified trimester: Secondary | ICD-10-CM | POA: Diagnosis not present

## 2018-08-25 NOTE — Progress Notes (Signed)
Virtual Visit via Telephone Note Due to current restrictions/limitations of in-office visits due to the COVID-19 pandemic, this scheduled clinical appointment was converted to a telehealth visit  I connected with Alicia Riley on 08/25/18 at  2:30 PM EDT by telephone and verified that I am speaking with the correct person using two identifiers. I am in my office.  The patient is at home.  Only the patient, myself and Burmese interpreter [phone], Harvie Junior) from [employer] participated in this encounter.  I discussed the limitations, risks, security and privacy concerns of performing an evaluation and management service by telephone and the availability of in person appointments. I also discussed with the patient that there may be a patient responsible charge related to this service. The patient expressed understanding and agreed to proceed.   History of Present Illness: This was a hospital f/u appt.  Pt new to our practice.  Pt is young FM who is [redacted] wks pregnant who was hospitalized 5/9-22/2020 with acute resp failure with hypoxia and sepsis due to COVID pneumonia requiring stay in the ICU but not intubated.  Treated with steroids and Remdesivir.  Liver enzymes steadily trended upwards.  Numbers began to trend downwards by the time of hospital discharge. Today: -feels weak over past 5 days.  -no SOB when sitting but sometimes when she walks, she does feel a little SOB.  She thinks this is due to the fact that she is pregnant.  Does not have a Pox machine at home -no cough, fever, muscle aches.  No using any OTC meds.  Only med she is taking from hosp dischg is Pre-natal Vit  Pt in 3rd trimester of pregnancy.  Followed by OB/GYN Dr. Marice Potter.  She spoke with her last week and has a virtual visit again today.  Observations/Objective: No direct observation done as this was a telephone encounter.  Lab Results  Component Value Date   WBC 7.0 08/15/2018   HGB 10.4 (L) 08/15/2018   HCT 30.8 (L)  08/15/2018   MCV 88.3 08/15/2018   PLT 260 08/15/2018     Chemistry      Component Value Date/Time   NA 137 08/15/2018 0558   K 3.6 08/15/2018 0558   CL 103 08/15/2018 0558   CO2 23 08/15/2018 0558   BUN 12 08/15/2018 0558   CREATININE 0.45 08/15/2018 0558      Component Value Date/Time   CALCIUM 8.8 (L) 08/15/2018 0558   ALKPHOS 87 08/15/2018 0558   AST 173 (H) 08/15/2018 0558   ALT 283 (H) 08/15/2018 0558   BILITOT 0.4 08/15/2018 0558      Assessment and Plan: 1. Hospital discharge follow-up 2. Pneumonia due to COVID-19 virus -Patient recovering and is clinically improved.  Advised to be seen again in the emergency room if any increased shortness of breath.  3. Abnormal liver function We will arrange to have her come to the lab in 2 weeks for repeat liver function test to make sure that they are still trending down - Hepatic Function Panel  4. Pregnant and not yet delivered in third trimester Followed by OB/GYN   Follow Up Instructions: Patient will come to the lab on June 15 to have LFTs repeated.  She will follow-up with me in 7 weeks.   I discussed the assessment and treatment plan with the patient. The patient was provided an opportunity to ask questions and all were answered. The patient agreed with the plan and demonstrated an understanding of the instructions.   The  patient was advised to call back or seek an in-person evaluation if the symptoms worsen or if the condition fails to improve as anticipated.  I provided 12 minutes of non-face-to-face time during this encounter.   Karle Plumber, MD

## 2018-09-01 ENCOUNTER — Ambulatory Visit (INDEPENDENT_AMBULATORY_CARE_PROVIDER_SITE_OTHER): Payer: Medicaid Other

## 2018-09-01 ENCOUNTER — Other Ambulatory Visit: Payer: Self-pay

## 2018-09-01 VITALS — BP 97/58 | HR 94

## 2018-09-01 DIAGNOSIS — Z3A34 34 weeks gestation of pregnancy: Secondary | ICD-10-CM | POA: Diagnosis not present

## 2018-09-01 DIAGNOSIS — J988 Other specified respiratory disorders: Secondary | ICD-10-CM

## 2018-09-01 DIAGNOSIS — Z3480 Encounter for supervision of other normal pregnancy, unspecified trimester: Secondary | ICD-10-CM

## 2018-09-01 DIAGNOSIS — Z758 Other problems related to medical facilities and other health care: Secondary | ICD-10-CM

## 2018-09-01 DIAGNOSIS — U071 COVID-19: Secondary | ICD-10-CM

## 2018-09-01 DIAGNOSIS — O98513 Other viral diseases complicating pregnancy, third trimester: Secondary | ICD-10-CM | POA: Diagnosis not present

## 2018-09-01 DIAGNOSIS — Z789 Other specified health status: Secondary | ICD-10-CM

## 2018-09-01 NOTE — Progress Notes (Signed)
   TELEHEALTH VIRTUAL OBSTETRICS VISIT ENCOUNTER NOTE  I connected with Alicia Riley on 09/01/18 at 10:55 AM EDT by telephone at home and verified that I am speaking with the correct person using two identifiers.   I discussed the limitations, risks, security and privacy concerns of performing an evaluation and management service by telephone and the availability of in person appointments. I also discussed with the patient that there may be a patient responsible charge related to this service. The patient expressed understanding and agreed to proceed.  Subjective:  Alicia Riley is a 38 y.o. [id number] at [redacted]w[redacted]d being followed for ongoing prenatal care.  She is currently monitored for the following issues for this high-risk pregnancy and has Language barrier affecting health care; Cervical polyp; Supervision of other normal pregnancy, antepartum; Late prenatal care affecting pregnancy; COVID-19; Acute respiratory failure with hypoxia ([place]); [redacted] weeks gestation of pregnancy; Respiration abnormal; and Constipation on their problem list.  Patient reports no complaints. Reports fetal movement. Denies any contractions, bleeding or leaking of fluid.   The following portions of the patient's history were reviewed and updated as appropriate: allergies, current medications, past family history, past medical history, past social history, past surgical history and problem list.   Objective:   General:  Alert, oriented and cooperative.   Mental Status: Normal mood and affect perceived. Normal judgment and thought content.  Rest of physical exam deferred due to type of encounter  Assessment and Plan:  Pregnancy: [id number] at [redacted]w[redacted]d 1. Supervision of other normal pregnancy, antepartum -BP 97/58 -Anticipatory guidance of next visit with GBS testing reviewed with patient  2. COVID-19 -Patient denies any symptoms. Reports feeling back to normal and has a good appetite -Next visit in person. Per office policy, it must be >62  days since positive COVID and patient must be symptom free. Discussed with patient that she must wear a mask to office and she must be symptom free to come to her appointment. Patient verbalized understanding.  3. Language barrier affecting health care   Preterm labor symptoms and general obstetric precautions including but not limited to vaginal bleeding, contractions, leaking of fluid and fetal movement were reviewed in detail with the patient.  I discussed the assessment and treatment plan with the patient. The patient was provided an opportunity to ask questions and all were answered. The patient agreed with the plan and demonstrated an understanding of the instructions. The patient was advised to call back or seek an in-person office evaluation/go to MAU at [facility] for any urgent or concerning symptoms. Please refer to After Visit Summary for other counseling recommendations.   I provided 20 minutes of non-face-to-face time during this encounter.  Return in about 2 weeks (around 09/15/2018) for Return OB visit/GBS.  Future Appointments  Date Time Provider [place]  09/01/2018 10:55 AM Wende Mott, CNM [facility] [place]  09/15/2018  1:15 PM Tresea Mall, CNM [facility] [place]  09/22/2018 10:55 AM Tresea Mall, CNM [facility] [place]  09/29/2018 10:55 AM Tresea Mall, CNM [facility] [place]  10/06/2018 10:55 AM Tresea Mall, CNM [facility] [place]  10/15/2018  9:15 AM Emily Filbert, MD [facility] W[facility]  10/15/2018 10:15 AM [facility] NST [place], [place] for [employer], [place]

## 2018-09-01 NOTE — Progress Notes (Signed)
I connected with  Alicia Riley on 09/01/18 at 10:55 AM EDT by telephone and verified that I am speaking with the correct person using two identifiers.   I discussed the limitations, risks, security and privacy concerns of performing an evaluation and management service by telephone and the availability of in person appointments. I also discussed with the patient that there may be a patient responsible charge related to this service. The patient expressed understanding and agreed to proceed.  Alric Seton, [place] 09/01/2018  10:41 AM  Burmese Interpreter ID# [phone]

## 2018-09-11 ENCOUNTER — Encounter: Payer: Self-pay | Admitting: Internal Medicine

## 2018-09-11 ENCOUNTER — Other Ambulatory Visit: Payer: Self-pay

## 2018-09-11 ENCOUNTER — Ambulatory Visit: Payer: Medicaid Other | Attending: Internal Medicine | Admitting: Internal Medicine

## 2018-09-11 VITALS — BP 100/64 | HR 89 | Temp 98.5°F | Resp 16 | Ht 63.0 in | Wt 142.0 lb

## 2018-09-11 DIAGNOSIS — Z09 Encounter for follow-up examination after completed treatment for conditions other than malignant neoplasm: Secondary | ICD-10-CM | POA: Insufficient documentation

## 2018-09-11 DIAGNOSIS — Z8616 Personal history of COVID-19: Secondary | ICD-10-CM

## 2018-09-11 DIAGNOSIS — Z8619 Personal history of other infectious and parasitic diseases: Secondary | ICD-10-CM | POA: Insufficient documentation

## 2018-09-11 DIAGNOSIS — Z3A36 36 weeks gestation of pregnancy: Secondary | ICD-10-CM | POA: Diagnosis not present

## 2018-09-11 DIAGNOSIS — R945 Abnormal results of liver function studies: Secondary | ICD-10-CM

## 2018-09-11 DIAGNOSIS — O9989 Other specified diseases and conditions complicating pregnancy, childbirth and the puerperium: Secondary | ICD-10-CM | POA: Insufficient documentation

## 2018-09-11 DIAGNOSIS — Z331 Pregnant state, incidental: Secondary | ICD-10-CM

## 2018-09-11 DIAGNOSIS — R7989 Other specified abnormal findings of blood chemistry: Secondary | ICD-10-CM

## 2018-09-11 NOTE — Progress Notes (Signed)
Patient ID: Alicia Riley, female    DOB: Sep 02, 1980  MRN: [id number]  CC: Follow-up   Subjective: Alicia Riley is a 38 y.o. female who presents for f/u post COVID Her concerns today include:   This is a 37 year old Burmese female who I had spoken with earlier this month posthospitalization for COVID pneumonia.  She presents today for her in office follow-up and to have liver enzymes rechecked.  She has not had any jaundice or abdominal pain.  No nausea or vomiting. She reports that she is doing well.  No shortness of breath.  No fever or cough. She is about [redacted] weeks pregnant.  She is following regularly with OB/GYN.  Patient Active Problem List   Diagnosis Date Noted  . Respiration abnormal   . Constipation   . Acute respiratory failure with hypoxia ([place])   . [redacted] weeks gestation of pregnancy   . COVID-19 08/02/2018  . Supervision of other normal pregnancy, antepartum 05/29/2018  . Late prenatal care affecting pregnancy 05/29/2018  . Language barrier affecting health care 05/11/2013  . Cervical polyp 05/11/2013     Current Outpatient Medications on File Prior to Visit  Medication Sig Dispense Refill  . AMBULATORY NON FORMULARY MEDICATION 1 Device by Other route once a week. Blood Pressure Cuff Medium Monitored Regularly at home ICD 10:Z34.90 1 kit 0  . guaiFENesin-dextromethorphan (ROBITUSSIN DM) 100-10 MG/5ML syrup Take 5 mLs by mouth every 4 (four) hours as needed for cough. 118 mL 0  . Ipratropium-Albuterol (COMBIVENT) 20-100 MCG/ACT AERS respimat Inhale 1 puff into the lungs every 6 (six) hours as needed for wheezing. 1 Inhaler 0  . phenol (CHLORASEPTIC) 1.4 % LIQD Use as directed 1 spray in the mouth or throat as needed for throat irritation / pain.  0  . polyethylene glycol (MIRALAX / GLYCOLAX) 17 g packet Take 17 g by mouth daily as needed for mild constipation. 14 each 0  . Prenatal Vit-Fe Fumarate-FA (PRENATAL MULTIVITAMIN) TABS tablet Take 1 tablet by mouth daily.      No  current facility-administered medications on file prior to visit.     No Known Allergies  Social History   Socioeconomic History  . Marital status: Married    Spouse name: Not on file  . Number of children: Not on file  . Years of education: Not on file  . Highest education level: Not on file  Occupational History  . Not on file  Social Needs  . Financial resource strain: Not on file  . Food insecurity    Worry: Not on file    Inability: Not on file  . Transportation needs    Medical: Not on file    Non-medical: Not on file  Tobacco Use  . Smoking status: Never Smoker  . Smokeless tobacco: Never Used  Substance and Sexual Activity  . Alcohol use: No  . Drug use: No  . Sexual activity: Not Currently    Comment: last had intercourse 1 month ago  Lifestyle  . Physical activity    Days per week: Not on file    Minutes per session: Not on file  . Stress: Not on file  Relationships  . Social Herbalist on phone: Not on file    Gets together: Not on file    Attends religious service: Not on file    Active member of club or organization: Not on file    Attends meetings of clubs or organizations: Not on file  Relationship status: Not on file  . Intimate partner violence    Fear of current or ex partner: Not on file    Emotionally abused: Not on file    Physically abused: Not on file    Forced sexual activity: Not on file  Other Topics Concern  . Not on file  Social History Narrative  . Not on file    No family history on file.  Past Surgical History:  Procedure Laterality Date  . DILATION AND EVACUATION N/A 08/03/2015   Procedure: DILATATION AND EVACUATION;  Surgeon: Jonnie Kind, MD;  Location: [facility];  Service: Gynecology;  Laterality: N/A;  . NO PAST SURGERIES      ROS: Review of Systems Negative except as stated above  PHYSICAL EXAM: BP 100/64   Pulse 89   Temp 98.5 F (36.9 C) (Oral)   Resp 16   Ht '5\' 3"'  (1.6 m)   Wt 142 lb (64.4  kg)   LMP 01/02/2018 (Exact Date)   SpO2 95%   BMI 25.15 kg/m   Physical Exam  General appearance - alert, well appearing, and in no distress Eyes: Nonicteric sclera. Mental status - normal mood, behavior, speech, dress, motor activity, and thought processes Chest - clear to auscultation, no wheezes, rales or rhonchi, symmetric air entry Heart - normal rate, regular rhythm, normal S1, S2, no murmurs, rubs, clicks or gallops Extremities - peripheral pulses normal, no pedal edema, no clubbing or cyanosis Abdomen: Patient is pregnant Skin: No jaundice noted. CMP Latest Ref Rng & Units 08/15/2018 08/14/2018 08/13/2018  Glucose 70 - 99 mg/dL 92 102(H) 93  BUN 6 - 20 mg/dL '12 12 13  ' Creatinine 0.44 - 1.00 mg/dL 0.45 0.39(L) 0.45  Sodium 135 - 145 mmol/L 137 137 137  Potassium 3.5 - 5.1 mmol/L 3.6 4.0 3.6  Chloride 98 - 111 mmol/L 103 105 102  CO2 22 - 32 mmol/L '23 23 23  ' Calcium 8.9 - 10.3 mg/dL 8.8(L) 8.6(L) 8.8(L)  Total Protein 6.5 - 8.1 g/dL 6.4(L) 6.1(L) 6.4(L)  Total Bilirubin 0.3 - 1.2 mg/dL 0.4 0.4 0.5  Alkaline Phos 38 - 126 U/L 87 98 96  AST 15 - 41 U/L 173(H) 194(H) 199(H)  ALT 0 - 44 U/L 283(H) 271(H) 236(H)   Lipid Panel  No results found for: CHOL, TRIG, HDL, CHOLHDL, VLDL, LDLCALC, LDLDIRECT  CBC    Component Value Date/Time   WBC 7.0 08/15/2018 0558   RBC 3.49 (L) 08/15/2018 0558   HGB 10.4 (L) 08/15/2018 0558   HGB 10.8 (L) 05/29/2018 1543   HCT 30.8 (L) 08/15/2018 0558   HCT 32.3 (L) 05/29/2018 1543   PLT 260 08/15/2018 0558   PLT 194 05/29/2018 1543   MCV 88.3 08/15/2018 0558   MCV 89 05/29/2018 1543   MCH 29.8 08/15/2018 0558   MCHC 33.8 08/15/2018 0558   RDW 13.3 08/15/2018 0558   RDW 13.1 05/29/2018 1543   LYMPHSABS 0.9 08/09/2018 0342   LYMPHSABS 1.1 05/29/2018 1543   MONOABS 0.4 08/09/2018 0342   EOSABS 0.0 08/09/2018 0342   EOSABS 0.2 05/29/2018 1543   BASOSABS 0.0 08/09/2018 0342   BASOSABS 0.0 05/29/2018 1543    ASSESSMENT AND PLAN: 1.  Abnormal LFTs Plan to recheck today. - Hepatic Function Panel  2. History of 2019 novel coronavirus disease (COVID-19) Fully recovered.   Patient was given the opportunity to ask questions.  Patient verbalized understanding of the plan and was able to repeat key elements of the plan.  [employer] interpreter Burmese used during this encounter. #[id number]   Orders Placed This Encounter  Procedures  . Hepatic Function Panel     Requested Prescriptions    No prescriptions requested or ordered in this encounter    No follow-ups on file.  Karle Plumber, MD, FACP

## 2018-09-12 ENCOUNTER — Telehealth: Payer: Self-pay | Admitting: Obstetrics & Gynecology

## 2018-09-12 LAB — HEPATIC FUNCTION PANEL
ALT: 22 IU/L (ref 0–32)
AST: 27 IU/L (ref 0–40)
Albumin: 3.5 g/dL — ABNORMAL LOW (ref 3.8–4.8)
Alkaline Phosphatase: 99 IU/L (ref 39–117)
Bilirubin Total: <0.2 mg/dL (ref 0.0–1.2)
Bilirubin, Direct: 0.08 mg/dL (ref 0.00–0.40)
Total Protein: 6.3 g/dL (ref 6.0–8.5)

## 2018-09-12 NOTE — Telephone Encounter (Signed)
Attempted to call patient with the help of interpreter [phone]. A message was left on her VM about her appointment.

## 2018-09-15 ENCOUNTER — Other Ambulatory Visit: Payer: Self-pay

## 2018-09-15 ENCOUNTER — Other Ambulatory Visit (HOSPITAL_COMMUNITY)
Admission: RE | Admit: 2018-09-15 | Discharge: 2018-09-15 | Disposition: A | Discharge status: Home / Self Care | Payer: Medicaid Other | Source: Ambulatory Visit | Attending: Family Medicine | Admitting: Family Medicine

## 2018-09-15 ENCOUNTER — Encounter: Payer: Medicaid Other | Admitting: Advanced Practice Midwife

## 2018-09-15 ENCOUNTER — Ambulatory Visit (INDEPENDENT_AMBULATORY_CARE_PROVIDER_SITE_OTHER): Payer: Medicaid Other | Admitting: Family Medicine

## 2018-09-15 VITALS — BP 102/62 | HR 76 | Temp 98.3°F | Wt 141.5 lb

## 2018-09-15 DIAGNOSIS — Z3A36 36 weeks gestation of pregnancy: Secondary | ICD-10-CM | POA: Diagnosis not present

## 2018-09-15 DIAGNOSIS — Z3483 Encounter for supervision of other normal pregnancy, third trimester: Secondary | ICD-10-CM

## 2018-09-15 DIAGNOSIS — Z3480 Encounter for supervision of other normal pregnancy, unspecified trimester: Secondary | ICD-10-CM | POA: Diagnosis not present

## 2018-09-15 DIAGNOSIS — U071 COVID-19: Secondary | ICD-10-CM

## 2018-09-15 NOTE — Progress Notes (Signed)
   PRENATAL VISIT NOTE  Subjective:  Alicia Riley is a 38 y.o. [id number] at [redacted]w[redacted]d being seen today for ongoing prenatal care.  She is currently monitored for the following issues for this low-risk pregnancy and has Language barrier affecting health care; Cervical polyp; Supervision of other normal pregnancy, antepartum; Late prenatal care affecting pregnancy; COVID-19; [redacted] weeks gestation of pregnancy; Respiration abnormal; and Constipation on their problem list.  Patient reports no complaints.  Contractions: Not present. Vag. Bleeding: None.  Movement: Present. Denies leaking of fluid.   The following portions of the patient's history were reviewed and updated as appropriate: allergies, current medications, past family history, past medical history, past social history, past surgical history and problem list.   Objective:   Vitals:   09/15/18 1612  BP: 102/62  Pulse: 76  Temp: 98.3 F (36.8 C)  Weight: 141 lb 8 oz (64.2 kg)    Fetal Status: Fetal Heart Rate (bpm): 132 Fundal Height: 36 cm Movement: Present  Presentation: Vertex  General:  Alert, oriented and cooperative. Patient is in no acute distress.  Skin: Skin is warm and dry. No rash noted.   Cardiovascular: Normal heart rate noted  Respiratory: Normal respiratory effort, no problems with respiration noted  Abdomen: Soft, gravid, appropriate for gestational age.  Pain/Pressure: Absent     Pelvic: Cervical exam performed Dilation: 3 Effacement (%): 70 Station: -2  Extremities: Normal range of motion.  Edema: None  Mental Status: Normal mood and affect. Normal behavior. Normal judgment and thought content.   Assessment and Plan:  Pregnancy: [id number] at [redacted]w[redacted]d  1. Supervision of other normal pregnancy, antepartum FHT and FH  2. COVID-19 Completely treated.  Term labor symptoms and general obstetric precautions including but not limited to vaginal bleeding, contractions, leaking of fluid and fetal movement were reviewed in detail  with the patient. Please refer to After Visit Summary for other counseling recommendations.   Return in about 1 week (around 09/22/2018) for OB f/u, Virtual.  Future Appointments  Date Time Provider [place]  09/22/2018 10:55 AM Tresea Mall, CNM [facility] [place]  09/29/2018 10:55 AM Tresea Mall, CNM [facility] [place]  10/06/2018 10:55 AM Tresea Mall, CNM [facility] [place]  10/15/2018  9:15 AM Emily Filbert, MD [facility] [place]  10/15/2018 10:15 AM [facility] NST [place], DO

## 2018-09-15 NOTE — Progress Notes (Signed)
Alicia Riley used to interpret.  Pt requests refill on PNV.

## 2018-09-17 LAB — CERVICOVAGINAL ANCILLARY ONLY
Chlamydia: NEGATIVE
Neisseria Gonorrhea: NEGATIVE

## 2018-09-19 ENCOUNTER — Telehealth: Payer: Self-pay | Admitting: Family Medicine

## 2018-09-19 LAB — CULTURE, BETA STREP (GROUP B ONLY): Strep Gp B Culture: NEGATIVE

## 2018-09-19 NOTE — Telephone Encounter (Signed)
Called the patient to confirm of upcoming appointment. Left a detailed voicemail of how to access the virtual visit via [employer] web[employer] meetings. Also informed the patient of logging into the visit 5-10 minutes prior to the appointment time

## 2018-09-22 ENCOUNTER — Telehealth: Payer: Self-pay | Admitting: Advanced Practice Midwife

## 2018-09-22 ENCOUNTER — Ambulatory Visit: Payer: Medicaid Other | Admitting: Advanced Practice Midwife

## 2018-09-22 ENCOUNTER — Other Ambulatory Visit: Payer: Self-pay

## 2018-09-22 ENCOUNTER — Encounter: Payer: Self-pay | Admitting: Advanced Practice Midwife

## 2018-09-22 DIAGNOSIS — Z3480 Encounter for supervision of other normal pregnancy, unspecified trimester: Secondary | ICD-10-CM

## 2018-09-22 NOTE — Progress Notes (Signed)
No answer interpreter Left a voicemail to call to reschedule appointment

## 2018-09-22 NOTE — Telephone Encounter (Signed)
Opened in error

## 2018-09-22 NOTE — Telephone Encounter (Signed)
Attempted to call patient with Burmese interpreter Alicia Riley to get patient rescheduled for her missed appointment on 6/29. No answer, interpreter left voicemail instructing the patient to keep her virtual appointment on 7/6 @ 10:55. Patient advised to give the office a call if needing to reschedule.

## 2018-09-29 ENCOUNTER — Encounter: Payer: Self-pay | Admitting: Advanced Practice Midwife

## 2018-09-29 ENCOUNTER — Ambulatory Visit: Payer: Medicaid Other | Admitting: Advanced Practice Midwife

## 2018-09-29 ENCOUNTER — Other Ambulatory Visit: Payer: Self-pay

## 2018-09-29 ENCOUNTER — Telehealth: Payer: Self-pay | Admitting: Obstetrics & Gynecology

## 2018-09-29 DIAGNOSIS — Z5329 Procedure and treatment not carried out because of patient's decision for other reasons: Secondary | ICD-10-CM

## 2018-09-29 DIAGNOSIS — Z91199 Patient's noncompliance with other medical treatment and regimen due to unspecified reason: Secondary | ICD-10-CM

## 2018-09-29 NOTE — Telephone Encounter (Signed)
The patient called in regards to the missed appointment. Rescheduled the patient for a virtual visit tomorrow and also informed of downloading the [employer] webex meeting app. The patient verbalized understanding.

## 2018-09-29 NOTE — Progress Notes (Signed)
Patient did not answer for her visit today.   Marcille Buffy DNP, CNM  09/29/18  11:41 AM

## 2018-09-29 NOTE — Progress Notes (Signed)
@  1055am no answer   @1103am  no answer interpreter lvm to call back to reschedule appointment

## 2018-09-30 ENCOUNTER — Other Ambulatory Visit: Payer: Self-pay

## 2018-09-30 ENCOUNTER — Encounter: Payer: Self-pay | Admitting: Medical

## 2018-09-30 ENCOUNTER — Ambulatory Visit (INDEPENDENT_AMBULATORY_CARE_PROVIDER_SITE_OTHER): Payer: Medicaid Other | Admitting: Medical

## 2018-09-30 VITALS — BP 97/66 | HR 72

## 2018-09-30 DIAGNOSIS — Z789 Other specified health status: Secondary | ICD-10-CM

## 2018-09-30 DIAGNOSIS — M549 Dorsalgia, unspecified: Secondary | ICD-10-CM | POA: Diagnosis not present

## 2018-09-30 DIAGNOSIS — Z3A38 38 weeks gestation of pregnancy: Secondary | ICD-10-CM | POA: Diagnosis not present

## 2018-09-30 DIAGNOSIS — Z3480 Encounter for supervision of other normal pregnancy, unspecified trimester: Secondary | ICD-10-CM

## 2018-09-30 DIAGNOSIS — O9989 Other specified diseases and conditions complicating pregnancy, childbirth and the puerperium: Secondary | ICD-10-CM

## 2018-09-30 NOTE — Patient Instructions (Signed)
Fetal Movement Counts Patient Name: ________________________________________________ Patient Due Date: ____________________ What is a fetal movement count?  A fetal movement count is the number of times that you feel your baby move during a certain amount of time. This may also be called a fetal kick count. A fetal movement count is recommended for every pregnant woman. You may be asked to start counting fetal movements as early as week 28 of your pregnancy. Pay attention to when your baby is most active. You may notice your baby's sleep and wake cycles. You may also notice things that make your baby move more. You should do a fetal movement count:  When your baby is normally most active.  At the same time each day. A good time to count movements is while you are resting, after having something to eat and drink. How do I count fetal movements? 1. Find a quiet, comfortable area. Sit, or lie down on your side. 2. Write down the date, the start time and stop time, and the number of movements that you felt between those two times. Take this information with you to your health care visits. 3. For 2 hours, count kicks, flutters, swishes, rolls, and jabs. You should feel at least 10 movements during 2 hours. 4. You may stop counting after you have felt 10 movements. 5. If you do not feel 10 movements in 2 hours, have something to eat and drink. Then, keep resting and counting for 1 hour. If you feel at least 4 movements during that hour, you may stop counting. Contact a health care provider if:  You feel fewer than 4 movements in 2 hours.  Your baby is not moving like he or she usually does. Date: ____________ Start time: ____________ Stop time: ____________ Movements: ____________ Date: ____________ Start time: ____________ Stop time: ____________ Movements: ____________ Date: ____________ Start time: ____________ Stop time: ____________ Movements: ____________ Date: ____________ Start time:  ____________ Stop time: ____________ Movements: ____________ Date: ____________ Start time: ____________ Stop time: ____________ Movements: ____________ Date: ____________ Start time: ____________ Stop time: ____________ Movements: ____________ Date: ____________ Start time: ____________ Stop time: ____________ Movements: ____________ Date: ____________ Start time: ____________ Stop time: ____________ Movements: ____________ Date: ____________ Start time: ____________ Stop time: ____________ Movements: ____________ This information is not intended to replace advice given to you by your health care provider. Make sure you discuss any questions you have with your health care provider. Document Released: 04/11/2006 Document Revised: 04/01/2018 Document Reviewed: 04/21/2015 Elsevier Patient Education  2020 Elsevier Inc. Braxton Hicks Contractions Contractions of the uterus can occur throughout pregnancy, but they are not always a sign that you are in labor. You may have practice contractions called Braxton Hicks contractions. These false labor contractions are sometimes confused with true labor. What are Braxton Hicks contractions? Braxton Hicks contractions are tightening movements that occur in the muscles of the uterus before labor. Unlike true labor contractions, these contractions do not result in opening (dilation) and thinning of the cervix. Toward the end of pregnancy (32-34 weeks), Braxton Hicks contractions can happen more often and may become stronger. These contractions are sometimes difficult to tell apart from true labor because they can be very uncomfortable. You should not feel embarrassed if you go to the hospital with false labor. Sometimes, the only way to tell if you are in true labor is for your health care provider to look for changes in the cervix. The health care provider will do a physical exam and may monitor your contractions. If you   are not in true labor, the exam should show  that your cervix is not dilating and your water has not broken. If there are no other health problems associated with your pregnancy, it is completely safe for you to be sent home with false labor. You may continue to have Braxton Hicks contractions until you go into true labor. How to tell the difference between true labor and false labor True labor  Contractions last 30-70 seconds.  Contractions become very regular.  Discomfort is usually felt in the top of the uterus, and it spreads to the lower abdomen and low back.  Contractions do not go away with walking.  Contractions usually become more intense and increase in frequency.  The cervix dilates and gets thinner. False labor  Contractions are usually shorter and not as strong as true labor contractions.  Contractions are usually irregular.  Contractions are often felt in the front of the lower abdomen and in the groin.  Contractions may go away when you walk around or change positions while lying down.  Contractions get weaker and are shorter-lasting as time goes on.  The cervix usually does not dilate or become thin. Follow these instructions at home:   Take over-the-counter and prescription medicines only as told by your health care provider.  Keep up with your usual exercises and follow other instructions from your health care provider.  Eat and drink lightly if you think you are going into labor.  If Braxton Hicks contractions are making you uncomfortable: ? Change your position from lying down or resting to walking, or change from walking to resting. ? Sit and rest in a tub of warm water. ? Drink enough fluid to keep your urine pale yellow. Dehydration may cause these contractions. ? Do slow and deep breathing several times an hour.  Keep all follow-up prenatal visits as told by your health care provider. This is important. Contact a health care provider if:  You have a fever.  You have continuous pain in  your abdomen. Get help right away if:  Your contractions become stronger, more regular, and closer together.  You have fluid leaking or gushing from your vagina.  You pass blood-tinged mucus (bloody show).  You have bleeding from your vagina.  You have low back pain that you never had before.  You feel your baby's head pushing down and causing pelvic pressure.  Your baby is not moving inside you as much as it used to. Summary  Contractions that occur before labor are called Braxton Hicks contractions, false labor, or practice contractions.  Braxton Hicks contractions are usually shorter, weaker, farther apart, and less regular than true labor contractions. True labor contractions usually become progressively stronger and regular, and they become more frequent.  Manage discomfort from Braxton Hicks contractions by changing position, resting in a warm bath, drinking plenty of water, or practicing deep breathing. This information is not intended to replace advice given to you by your health care provider. Make sure you discuss any questions you have with your health care provider. Document Released: 07/26/2016 Document Revised: 02/22/2017 Document Reviewed: 07/26/2016 Elsevier Patient Education  2020 Elsevier Inc.  

## 2018-09-30 NOTE — Progress Notes (Signed)
[place] (Bur[place]ese Interpreter)  I connected with  Purdy on 09/30/18 at  1:55 PM EDT by telephone and verified that I am speaking with the correct person using two identifiers.   I discussed the limitations, risks, security and privacy concerns of performing an evaluation and management service by telephone and the availability of in person appointments. I also discussed with the patient that there may be a patient responsible charge related to this service. The patient expressed understanding and agreed to proceed.  [place], [place] 09/30/2018  2:03 PM

## 2018-09-30 NOTE — Progress Notes (Signed)
   TELEHEALTH VIRTUAL OBSTETRICS VISIT ENCOUNTER NOTE  I connected with Alicia Riley on 09/30/18 at  1:55 PM EDT by telephone at home and verified that I am speaking with the correct person using two identifiers.   I discussed the limitations, risks, security and privacy concerns of performing an evaluation and management service by telephone and the availability of in person appointments. I also discussed with the patient that there may be a patient responsible charge related to this service. The patient expressed understanding and agreed to proceed.  Subjective:  Alicia Riley is a 38 y.o. [phone] at [redacted]w[redacted]d being followed for ongoing prenatal care.  She is currently monitored for the following issues for this high-risk pregnancy and has Language barrier affecting health care; Cervical polyp; Supervision of other normal pregnancy, antepartum; Late prenatal care affecting pregnancy; COVID-19; Respiration abnormal; and Constipation on their problem list.  Patient reports backache. Reports fetal movement. Denies any contractions, bleeding or leaking of fluid.   The following portions of the patient's history were reviewed and updated as appropriate: allergies, current medications, past family history, past medical history, past social history, past surgical history and problem list.   Objective:   General:  Alert, oriented and cooperative.   Mental Status: Normal mood and affect perceived. Normal judgment and thought content.  Rest of physical exam deferred due to type of encounter  Assessment and Plan:  Pregnancy: [id number] at [redacted]w[redacted]d 1. Supervision of other normal pregnancy, antepartum - Doing well - GBS negative reviewed   2. Language barrier affecting health care - Used pacific interpreters   3. Back pain affecting pregnancy in third trimester - Pain comes and goes, mild   Preterm labor symptoms and general obstetric precautions including but not limited to vaginal bleeding, contractions, leaking  of fluid and fetal movement were reviewed in detail with the patient.  I discussed the assessment and treatment plan with the patient. The patient was provided an opportunity to ask questions and all were answered. The patient agreed with the plan and demonstrated an understanding of the instructions. The patient was advised to call back or seek an in-person office evaluation/go to [facility] at [facility] for any urgent or concerning symptoms. Please refer to After Visit Summary for other counseling recommendations.   I provided 10 minutes of non-face-to-face time during this encounter.  Return in about 1 week (around 10/07/2018) for LOB, Virtual.  Future Appointments  Date Time Provider [place]  10/06/2018 10:55 AM Tresea Mall, CNM [facility] [place]  10/15/2018  9:15 AM Emily Filbert, MD [facility] [place]  10/15/2018 10:15 AM [facility]    Kerry Hough, PA-C [facility] [employer], [place]

## 2018-10-03 ENCOUNTER — Telehealth: Payer: Self-pay | Admitting: Advanced Practice Midwife

## 2018-10-03 NOTE — Telephone Encounter (Signed)
Spoke with patient w/ Bur[place]ese ID# [phone] about her appointment on 7/13 @ 10:55. Patient instructed that the visit is a virtual appointment. Patient instructed to download the [employer]. App was download while we were on the phone.

## 2018-10-05 ENCOUNTER — Inpatient Hospital Stay (HOSPITAL_COMMUNITY)
Admission: AD | Admit: 2018-10-05 | Discharge: 2018-10-06 | DRG: 807 | Disposition: A | Discharge status: Home / Self Care | Payer: Medicaid Other | Attending: Obstetrics & Gynecology | Admitting: Obstetrics & Gynecology

## 2018-10-05 DIAGNOSIS — O26893 Other specified pregnancy related conditions, third trimester: Secondary | ICD-10-CM | POA: Diagnosis present

## 2018-10-05 DIAGNOSIS — Z1159 Encounter for screening for other viral diseases: Secondary | ICD-10-CM

## 2018-10-05 DIAGNOSIS — R58 Hemorrhage, not elsewhere classified: Secondary | ICD-10-CM | POA: Diagnosis not present

## 2018-10-05 DIAGNOSIS — R52 Pain, unspecified: Secondary | ICD-10-CM | POA: Diagnosis not present

## 2018-10-05 DIAGNOSIS — Z3A39 39 weeks gestation of pregnancy: Secondary | ICD-10-CM

## 2018-10-05 DIAGNOSIS — Z8619 Personal history of other infectious and parasitic diseases: Secondary | ICD-10-CM | POA: Diagnosis not present

## 2018-10-05 DIAGNOSIS — R0902 Hypoxemia: Secondary | ICD-10-CM | POA: Diagnosis not present

## 2018-10-05 LAB — CBC
HCT: 32.8 % — ABNORMAL LOW (ref 36.0–46.0)
Hemoglobin: 10.7 g/dL — ABNORMAL LOW (ref 12.0–15.0)
MCH: 29.9 pg (ref 26.0–34.0)
MCHC: 32.6 g/dL (ref 30.0–36.0)
MCV: 91.6 fL (ref 80.0–100.0)
Platelets: 150 10*3/uL (ref 150–400)
RBC: 3.58 MIL/uL — ABNORMAL LOW (ref 3.87–5.11)
RDW: 15.3 % (ref 11.5–15.5)
WBC: 11 10*3/uL — ABNORMAL HIGH (ref 4.0–10.5)
nRBC: 0 % (ref 0.0–0.2)

## 2018-10-05 LAB — TYPE AND SCREEN
ABO/RH(D): A POS
Antibody Screen: NEGATIVE

## 2018-10-05 LAB — SARS CORONAVIRUS 2 BY RT PCR (HOSPITAL ORDER, PERFORMED IN ~~LOC~~ HOSPITAL LAB): SARS Coronavirus 2: NEGATIVE

## 2018-10-05 MED ORDER — WITCH HAZEL-GLYCERIN EX PADS: 1.0000 "application " | MEDICATED_PAD | CUTANEOUS | Status: DC | PRN

## 2018-10-05 MED ORDER — IBUPROFEN 600 MG PO TABS
600.0000 mg | ORAL_TABLET | Freq: Four times a day (QID) | ORAL | Status: DC
  Administered 2018-10-05 – 2018-10-06 (×5): 600 mg via ORAL
  Filled 2018-10-05 (×4): qty 1

## 2018-10-05 MED ORDER — OXYTOCIN 40 UNITS IN NORMAL SALINE INFUSION - SIMPLE MED
INTRAVENOUS | Status: AC
  Filled 2018-10-05: qty 1000

## 2018-10-05 MED ORDER — LACTATED RINGERS IV SOLN
INTRAVENOUS | Status: DC
  Administered 2018-10-05: 10:00:00 via INTRAVENOUS

## 2018-10-05 MED ORDER — OXYTOCIN BOLUS FROM INFUSION
500.0000 mL | Freq: Once | INTRAVENOUS | Status: AC
  Administered 2018-10-05: 12:00:00 500 mL via INTRAVENOUS

## 2018-10-05 MED ORDER — PRENATAL MULTIVITAMIN CH
1.0000 | ORAL_TABLET | Freq: Every day | ORAL | Status: DC
  Administered 2018-10-06: 13:00:00 1 via ORAL
  Filled 2018-10-05: qty 1

## 2018-10-05 MED ORDER — ZOLPIDEM TARTRATE 5 MG PO TABS: 5.0000 mg | ORAL_TABLET | Freq: Every evening | ORAL | Status: DC | PRN

## 2018-10-05 MED ORDER — DIPHENHYDRAMINE HCL 25 MG PO CAPS: 25.0000 mg | ORAL_CAPSULE | Freq: Four times a day (QID) | ORAL | Status: DC | PRN

## 2018-10-05 MED ORDER — TETANUS-DIPHTH-ACELL PERTUSSIS 5-2.5-18.5 LF-MCG/0.5 IM SUSP: 0.5000 mL | Freq: Once | INTRAMUSCULAR | Status: DC

## 2018-10-05 MED ORDER — OXYTOCIN BOLUS FROM INFUSION
500.0000 mL | Freq: Once | INTRAVENOUS | Status: AC
  Administered 2018-10-05: 500 mL via INTRAVENOUS

## 2018-10-05 MED ORDER — OXYTOCIN 40 UNITS IN NORMAL SALINE INFUSION - SIMPLE MED: 2.5000 [IU]/h | INTRAVENOUS | Status: DC

## 2018-10-05 MED ORDER — COCONUT OIL OIL: 1.0000 "application " | TOPICAL_OIL | Status: DC | PRN

## 2018-10-05 MED ORDER — ACETAMINOPHEN 325 MG PO TABS: 650.0000 mg | ORAL_TABLET | ORAL | Status: DC | PRN

## 2018-10-05 MED ORDER — ONDANSETRON HCL 4 MG/2ML IJ SOLN: 4.0000 mg | INTRAMUSCULAR | Status: DC | PRN

## 2018-10-05 MED ORDER — DIBUCAINE (PERIANAL) 1 % EX OINT: 1.0000 "application " | TOPICAL_OINTMENT | CUTANEOUS | Status: DC | PRN

## 2018-10-05 MED ORDER — ONDANSETRON HCL 4 MG PO TABS: 4.0000 mg | ORAL_TABLET | ORAL | Status: DC | PRN

## 2018-10-05 MED ORDER — SIMETHICONE 80 MG PO CHEW: 80.0000 mg | CHEWABLE_TABLET | ORAL | Status: DC | PRN

## 2018-10-05 MED ORDER — SENNOSIDES-DOCUSATE SODIUM 8.6-50 MG PO TABS
2.0000 | ORAL_TABLET | ORAL | Status: DC
  Administered 2018-10-06: 2 via ORAL
  Filled 2018-10-05: qty 2

## 2018-10-05 MED ORDER — BENZOCAINE-MENTHOL 20-0.5 % EX AERO: 1.0000 "application " | INHALATION_SPRAY | CUTANEOUS | Status: DC | PRN

## 2018-10-05 NOTE — Discharge Summary (Signed)
Postpartum Discharge Summary     Patient Name: Alicia Riley DOB: 1980-10-05 MRN: [id number]  Date of admission: 10/05/2018 Delivering Provider: Fatima Blank A   Date of discharge: 10/06/2018  Admitting diagnosis: Vag Bleed  Intrauterine pregnancy: [redacted]w[redacted]d     Secondary diagnosis:  Active Problems:   NSVD (normal spontaneous vaginal delivery)  Additional problems: none     Discharge diagnosis: Term Pregnancy Delivered                                                                                                Post partum procedures:none  Augmentation: none  Complications: None  Hospital course:  Onset of Labor With Vaginal Delivery     38 y.o. yo [id number] at [redacted]w[redacted]d was admitted in Active Labor on 10/05/2018. Patient had an uncomplicated labor course as follows:  Membrane Rupture Time/Date: 10:10 AM ,10/05/2018   Intrapartum Procedures: Episiotomy: None [1]                                         Lacerations:  None [1]  Patient had a delivery of a Viable infant. 10/05/2018  Information for the patient's newborn:  Ashlon, Lottman [[id number]]  Delivery Method: Vag-Spont     Pateint had an uncomplicated postpartum course.  She is ambulating, tolerating a regular diet, passing flatus, and urinating well. Patient is discharged home in stable condition on 10/06/18.   Magnesium Sulfate recieved: No BMZ received: No  Physical exam  Vitals:   10/05/18 1930 10/05/18 2357 10/06/18 0400 10/06/18 1447  BP: (!) 102/57 (!) 95/52 (!) 92/58 (!) 101/56  Pulse: 67 65 63 (!) 54  Resp: 18 19 18 16   Temp: 98.1 F (36.7 C) 98.3 F (36.8 C) 98 F (36.7 C) 98.7 F (37.1 C)  TempSrc: Oral Oral Oral Oral  SpO2: 98% 98% 99%    General: alert, cooperative and no distress Lochia: appropriate Uterine Fundus: firm Incision: N/A DVT Evaluation: No evidence of DVT seen on physical exam. Labs: Lab Results  Component Value Date   WBC 12.8 (H) 10/06/2018   HGB 10.5 (L) 10/06/2018   HCT 32.1  (L) 10/06/2018   MCV 93.0 10/06/2018   PLT 161 10/06/2018   CMP Latest Ref Rng & Units 09/11/2018  Glucose 70 - 99 mg/dL -  BUN 6 - 20 mg/dL -  Creatinine 0.44 - 1.00 mg/dL -  Sodium 135 - 145 mmol/L -  Potassium 3.5 - 5.1 mmol/L -  Chloride 98 - 111 mmol/L -  CO2 22 - 32 mmol/L -  Calcium 8.9 - 10.3 mg/dL -  Total Protein 6.0 - 8.5 g/dL 6.3  Total Bilirubin 0.0 - 1.2 mg/dL <0.2  Alkaline Phos 39 - 117 IU/L 99  AST 0 - 40 IU/L 27  ALT 0 - 32 IU/L 22    Discharge instruction: per After Visit Summary and "Baby and Me Booklet".  After visit meds:  Allergies as of 10/06/2018   No Known Allergies     Medication List  STOP taking these medications   guaiFENesin-dextromethorphan 100-10 MG/5ML syrup Commonly known as: ROBITUSSIN DM   Ipratropium-Albuterol 20-100 MCG/ACT Aers respimat Commonly known as: COMBIVENT   phenol 1.4 % Liqd Commonly known as: CHLORASEPTIC     TAKE these medications   AMBULATORY NON FORMULARY MEDICATION 1 Device by Other route once a week. Blood Pressure Cuff Medium Monitored Regularly at home ICD 10:Z34.90   ibuprofen 600 MG tablet Commonly known as: ADVIL Take 1 tablet (600 mg total) by mouth every 6 (six) hours.   polyethylene glycol 17 g packet Commonly known as: MIRALAX / GLYCOLAX Take 17 g by mouth daily as needed for mild constipation.   prenatal multivitamin Tabs tablet Take 1 tablet by mouth daily.   senna-docusate 8.6-50 MG tablet Commonly known as: Senokot-S Take 2 tablets by mouth daily. Start taking on: October 07, 2018       Diet: routine diet  Activity: Advance as tolerated. Pelvic rest for 6 weeks.   Outpatient follow up:6 weeks Follow up Appt: No future appointments. Follow up Visit:  [facility]  Please schedule this patient for PP visit in: 6 weeks Low risk pregnancy complicated by: COVID positive 5/9-5/22. Negative COVID on 7/12 Delivery mode:  SVD Anticipated Birth  Control:  POPs PP Procedures needed: none  Schedule Integrated BH visit: no Provider: Any provider    Newborn Data: Live born female  Birth Weight: 7 lb 6.9 oz (3370 g) APGAR: 8, 9  Newborn Delivery   Birth date/time: 10/05/2018 10:21:00 Delivery type: Vaginal, Spontaneous      Baby Feeding: Bottle and Breast Disposition:home with mother   10/06/2018 Sharen CounterLisa Leftwich-Kirby, CNM

## 2018-10-05 NOTE — H&P (Signed)
Alicia Riley is a 38 y.o. female [phone] @[redacted]w[redacted]d  presenting for active labor at term. Her history is significant for COVID positive sepsis admission from 08/02/18 to 08/15/18.  She was discharged in good condition and has not had any respiratory symptoms in over 1 month.  Her pregnancy has been uncomplicated otherwise.  She has hx of term vaginal delivery x 2.   OB History    Gravida  4   Para  2   Term  2   Preterm      AB  1   Living  2     SAB  1   TAB      Ectopic      Multiple      Live Births  2          Past Medical History:  Diagnosis Date  . Medical history non-contributory    Past Surgical History:  Procedure Laterality Date  . DILATION AND EVACUATION N/A 08/03/2015   Procedure: DILATATION AND EVACUATION;  Surgeon: Tilda BurrowJohn Ferguson V, MD;  Location: [facility];  Service: Gynecology;  Laterality: N/A;  . NO PAST SURGERIES     Family History: family history is not on file. Social History:  reports that she has never smoked. She has never used smokeless tobacco. She reports that she does not drink alcohol or use drugs.     Maternal Diabetes: No Genetic Screening: Normal Maternal Ultrasounds/Referrals: Normal Fetal Ultrasounds or other Referrals:  None Maternal Substance Abuse:  No Significant Maternal Medications:  None Significant Maternal Lab Results:  Other:  Other Comments:  COVID positive sepsis with hospital admission 08/02/18-08/15/18. Asymptomatic on admission 10/05/18.  Review of Systems  Constitutional: Negative for chills, fever and malaise/fatigue.  Eyes: Negative for blurred vision.  Respiratory: Negative for cough and shortness of breath.   Cardiovascular: Negative for chest pain.  Gastrointestinal: Positive for abdominal pain. Negative for heartburn and vomiting.  Genitourinary: Negative for dysuria, frequency and urgency.  Musculoskeletal: Negative.   Neurological: Negative for dizziness and headaches.  Psychiatric/Behavioral: Negative for depression.    Maternal Medical History:  Reason for admission: Contractions.   Contractions: Onset was 1-2 hours ago.    Fetal activity: Perceived fetal activity is normal.   Last perceived fetal movement was within the past hour.    Prenatal complications: no prenatal complications Prenatal Complications - Diabetes: none.    Dilation: 10 Effacement (%): 100 Station: Plus 2 Exam by:: Judeth HornErin Lawrence NP Blood pressure (!) 99/57, pulse 69, resp. rate 19, last menstrual period 01/02/2018, unknown if currently breastfeeding. Maternal Exam:  Uterine Assessment: Contraction strength is moderate.  Contraction frequency is regular.   Abdomen: Fetal presentation: vertex  Cervix: Cervix evaluated by digital exam.     Fetal Exam Fetal Monitor Review: Mode: ultrasound.   Baseline rate: 135.  Variability: moderate (6-25 bpm).   Pattern: accelerations present and variable decelerations.    Fetal State Assessment: Category II - tracings are indeterminate.     Physical Exam  Nursing note and vitals reviewed. Constitutional: She is oriented to person, place, and time. She appears well-developed and well-nourished.  Neck: Normal range of motion.  Cardiovascular: Normal rate and regular rhythm.  Respiratory: Effort normal and breath sounds normal.  GI: Soft.  Musculoskeletal: Normal range of motion.  Neurological: She is alert and oriented to person, place, and time.  Skin: Skin is warm and dry.  Psychiatric: She has a normal mood and affect. Her behavior is normal. Judgment and thought content normal.  Prenatal labs: ABO, Rh: A/Positive/-- (03/05 1543) Antibody: Negative (03/05 1543) Rubella: 5.84 (03/05 1543) RPR: Non Reactive (03/05 1543)  HBsAg: Negative (03/05 1543)  HIV: Non Reactive (03/05 1543)  GBS:   Negative  Assessment/Plan: 38 y.o. [id number] at 101w3d in active labor at term GBS negative Hx COVID sepsis with admission 08/02/18 to 08/15/18, asymptomatic x several weeks  Admit,  precipitous delivery in MAU, see delivery note COVID rapid swab was ordered at pediatrician's request and results pending Mother and baby stable, expect transfer to postpartum unit per routine schedule   Fatima Blank 10/05/2018, 12:05 PM

## 2018-10-05 NOTE — Lactation Note (Signed)
This note was copied from a baby's chart. Lactation Consultation Note  Patient Name: Alicia Riley Today's Date: 10/05/2018 Reason for consult: Initial assessment  2014 - [place] Interpretor assisted this initial lactation consultation. Ms. Pablo was breast feeding her daughter, [name], upon entry in cradle hold on her left breast. She states that she has breast fed her previous children, and I observed her latch her daughter without assistance. She denied breast pain with latch.  I educated Ms. Aleli on normal infant feeding patterns in the first days of breast feeding as well as output expectations. Ms. Itzamara asked about when she could request formula; I advised that this was her decision. I then educated on the benefits of breast milk and encouraged her to breast feed baby on demand. I discussed supply and demand nature of breast milk production.  Ms. Jannat does not have a breast pump, but she did use [place] [facility] during pregnancy. I informed her that [facility] had breast feeding resources and that we could provide her with a manual pump if requested.  Ms. Frederica had no questions or concerns at this time. She does understand a few [place] phrases, but I primarily relied on the interpretor for this visit.  Maternal Data Formula Feeding for Exclusion: No Does the patient have breastfeeding experience prior to this delivery?: Yes  Feeding Feeding Type: Breast Fed  LATCH Score Latch: Grasps breast easily, tongue down, lips flanged, rhythmical sucking.  Audible Swallowing: A few with stimulation  Type of Nipple: Everted at rest and after stimulation  Comfort (Breast/Nipple): Soft / non-tender  Hold (Positioning): No assistance needed to correctly position infant at breast.  LATCH Score: 9  Interventions Interventions: Breast feeding basics reviewed  Lactation Tools Discussed/Used WIC Program: Yes   Consult Status Consult Status: Follow-up Date: 10/06/18 Follow-up type:  In-patient    Lenore Manner 10/05/2018, 8:56 PM

## 2018-10-05 NOTE — Progress Notes (Signed)
Interpreter Graceann Congress [phone] used to orient patient to room

## 2018-10-06 ENCOUNTER — Encounter: Payer: Medicaid Other | Admitting: Advanced Practice Midwife

## 2018-10-06 LAB — CBC
HCT: 32.1 % — ABNORMAL LOW (ref 36.0–46.0)
Hemoglobin: 10.5 g/dL — ABNORMAL LOW (ref 12.0–15.0)
MCH: 30.4 pg (ref 26.0–34.0)
MCHC: 32.7 g/dL (ref 30.0–36.0)
MCV: 93 fL (ref 80.0–100.0)
Platelets: 161 10*3/uL (ref 150–400)
RBC: 3.45 MIL/uL — ABNORMAL LOW (ref 3.87–5.11)
RDW: 15.6 % — ABNORMAL HIGH (ref 11.5–15.5)
WBC: 12.8 10*3/uL — ABNORMAL HIGH (ref 4.0–10.5)
nRBC: 0 % (ref 0.0–0.2)

## 2018-10-06 LAB — ABO/RH: ABO/RH(D): A POS

## 2018-10-06 LAB — RPR: RPR Ser Ql: NONREACTIVE

## 2018-10-06 MED ORDER — IBUPROFEN 600 MG PO TABS: 600.0000 mg | ORAL_TABLET | Freq: Four times a day (QID) | ORAL | 1 refills | Status: DC

## 2018-10-06 MED ORDER — SENNOSIDES-DOCUSATE SODIUM 8.6-50 MG PO TABS: 2.0000 | ORAL_TABLET | ORAL | 0 refills | Status: DC

## 2018-10-06 NOTE — Progress Notes (Signed)
Used interpreter for assessment for mom and baby. Interpreter Abigail Butts ID # [id number]. Mom voiced understanding.

## 2018-10-06 NOTE — Lactation Note (Signed)
This note was copied from a baby's chart. Lactation Consultation Note  Patient Name: Alicia Riley Today's Date: 10/06/2018   Mom is a P2 and was seen by an IBCLC last night. Infant is 14 hrs old & has had excellent output since birth (5 voids, 6 stools). Per Dorian Pod, RN, mom is self-sufficient with breastfeeding & is also offering formula. RN concurs there is no need for an additional lactation visit. RN to provide hand pump.   Matthias [facility] 10/06/2018, 11:43 AM

## 2018-10-14 ENCOUNTER — Encounter: Payer: Medicaid Other | Admitting: Obstetrics & Gynecology

## 2018-10-15 ENCOUNTER — Encounter: Payer: Medicaid Other | Admitting: Obstetrics & Gynecology

## 2018-10-15 ENCOUNTER — Other Ambulatory Visit: Payer: Medicaid Other

## 2018-11-07 ENCOUNTER — Other Ambulatory Visit: Payer: Self-pay

## 2018-11-07 ENCOUNTER — Ambulatory Visit (INDEPENDENT_AMBULATORY_CARE_PROVIDER_SITE_OTHER): Payer: Medicaid Other | Admitting: Medical

## 2018-11-07 ENCOUNTER — Encounter: Payer: Self-pay | Admitting: Medical

## 2018-11-07 DIAGNOSIS — Z1389 Encounter for screening for other disorder: Secondary | ICD-10-CM | POA: Diagnosis not present

## 2018-11-07 DIAGNOSIS — Z3009 Encounter for other general counseling and advice on contraception: Secondary | ICD-10-CM

## 2018-11-07 MED ORDER — NORETHINDRONE 0.35 MG PO TABS: 1.0000 | ORAL_TABLET | Freq: Every day | ORAL | 11 refills | Status: DC

## 2018-11-07 NOTE — Progress Notes (Signed)
Pt wants Birth Control Pills. Pt also states sees a little blood when urinates but no pain.

## 2018-11-07 NOTE — Progress Notes (Signed)
I connected with Graciella Freer on 11/07/18 at 10:35 AM EDT by: telephone and verified that I am speaking with the correct person using two identifiers.  Patient is located at home and provider is located at home.     The purpose of this televisit is to provide medical care while limiting exposure to the novel coronavirus. I discussed the limitations, risks, security and privacy concerns of performing an evaluation and management service by telephone and the availability of in person appointments. I also discussed with the patient that there may be a patient responsible charge related to this service. By engaging in this virtual visit, you consent to the provision of healthcare.  Additionally, you authorize for your insurance to be billed for the services provided during this visit.  The patient expressed understanding and agreed to proceed.  The following staff members participated in the tele visit:  Carver Fila, [place] Visit Note Subjective:    Ms. Eliora Nienhuis is a 38 y.o. [phone] female who presents for a postpartum visit. She is 4 weeks postpartum following a spontaneous vaginal delivery. I have fully reviewed the prenatal and intrapartum course. The delivery was at 39.3 gestational weeks. Outcome: spontaneous vaginal delivery. Anesthesia: none. Postpartum course has been normal. Baby's course has been normal. Baby is feeding by both breast and bottle - [employer] Good Start. Bleeding staining only. Bowel function is normal. Bladder function is normal. Patient is not sexually active. Contraception method is abstinence. Postpartum depression screening: negative.  The following portions of the patient's history were reviewed and updated as appropriate: allergies, current medications, past family history, past medical history, past social history, past surgical history and problem list.  Review of Systems Pertinent items are noted in HPI.   Objective:   Vitals:   11/07/18 1034  BP: 98/70  Pulse: 74    Self-Obtained       Assessment:    Normal postpartum exam. Pap smear not done at today's visit. Last pap smear 05/2018 and results were normal. Next pap due 05/2021.   Plan:    1. Contraception: oral progesterone-only contraceptive, Rx sent with R11 2. Patient advised to let us know if she discontinues breastfeeding so that OCPs can be changed to combination pill  3. Follow up in: 1 year for annual exam or sooner as needed.   Non face-to-face time spent 8 minutes   Danielle Rankin 11/07/2018 10:43 AM

## 2018-11-07 NOTE — Patient Instructions (Signed)

## 2020-06-15 IMAGING — DX PORTABLE CHEST - 1 VIEW
1 series · 1 of 1 positions shown · non-contrast
Comparison: 10/20/2013

CLINICAL DATA: Fever and cough with positive NWSD0-3E test

EXAM:
PORTABLE CHEST 1 VIEW

[chest ap]
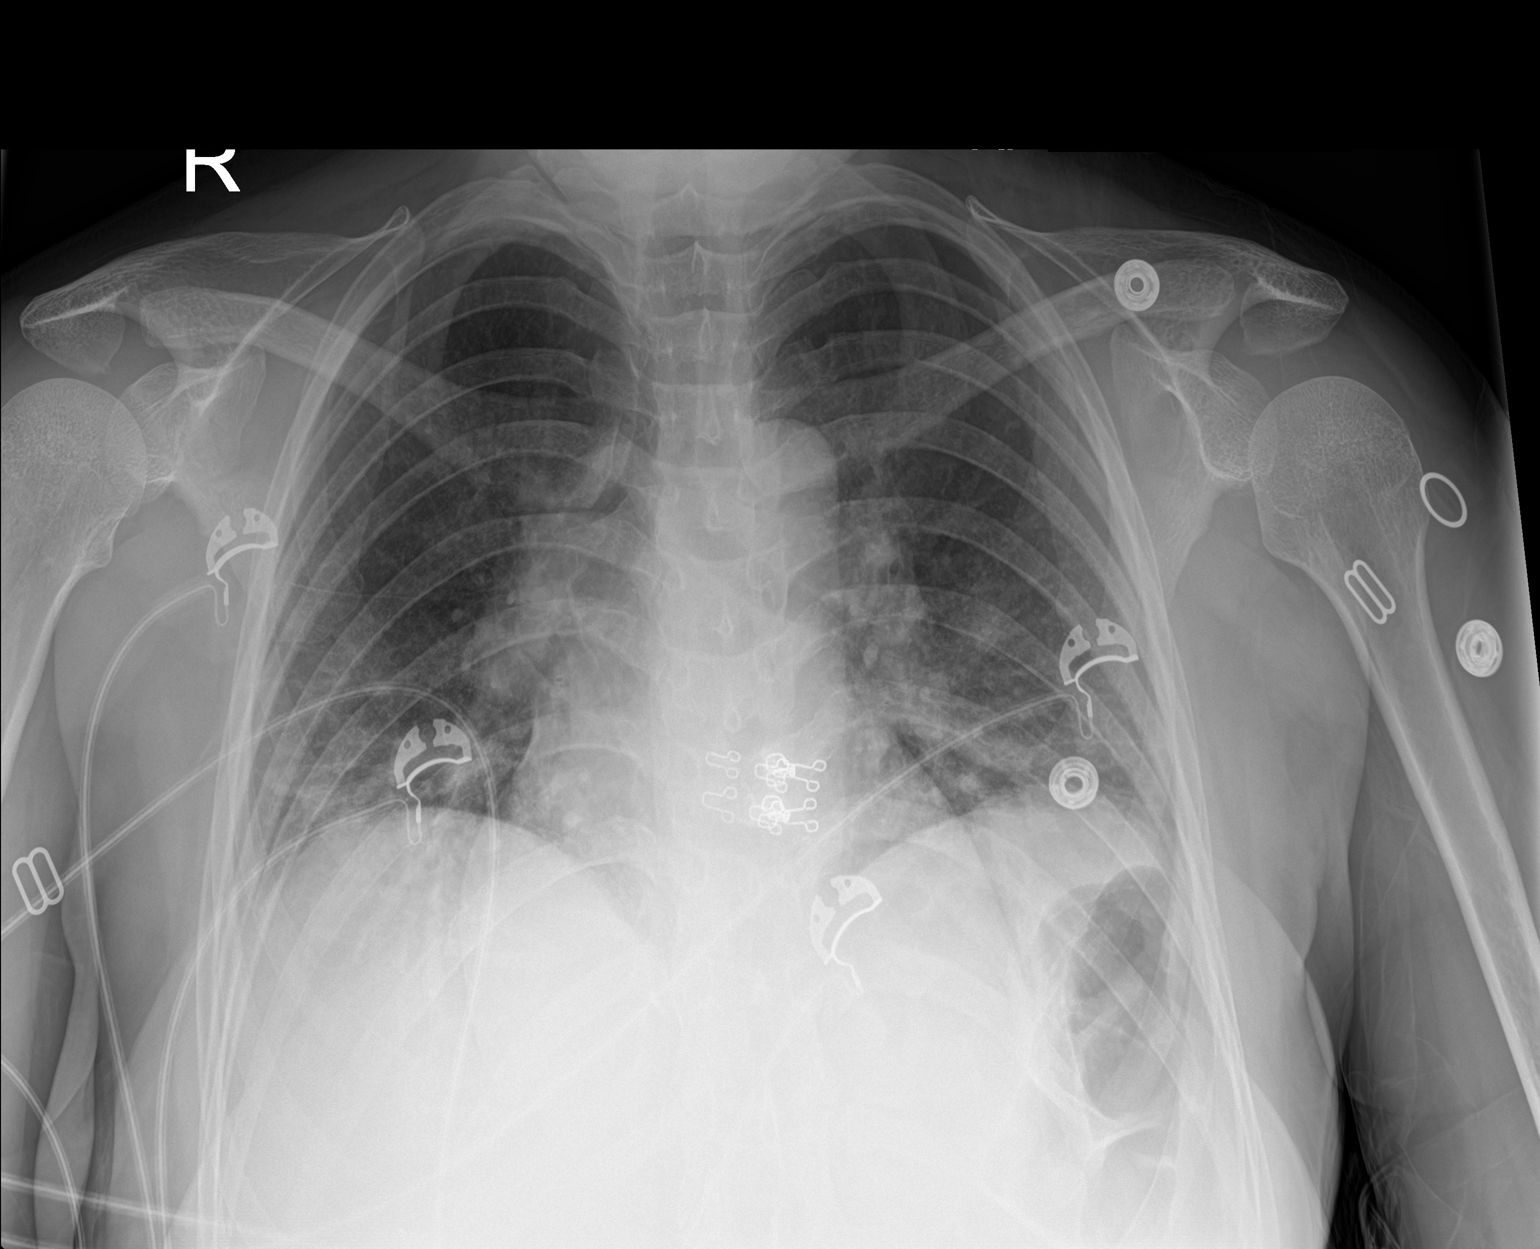

[1 of 1 positions shown; findings below may reference images not displayed]

FINDINGS: Cardiac shadows within normal limits. The lungs are hypoinflated
with bibasilar opacities. No sizable effusion is seen. No bony
abnormality is noted.
IMPRESSION: Bibasilar opacities and poor inspiratory effort.

## 2020-06-20 IMAGING — DX PORTABLE CHEST - 1 VIEW
1 series · 1 of 1 positions shown · non-contrast
Comparison: Prior chest x-ray 08/05/2018

CLINICAL DATA: 37-year-old female with abnormal respirations

EXAM:
PORTABLE CHEST 1 VIEW

[chest ap]
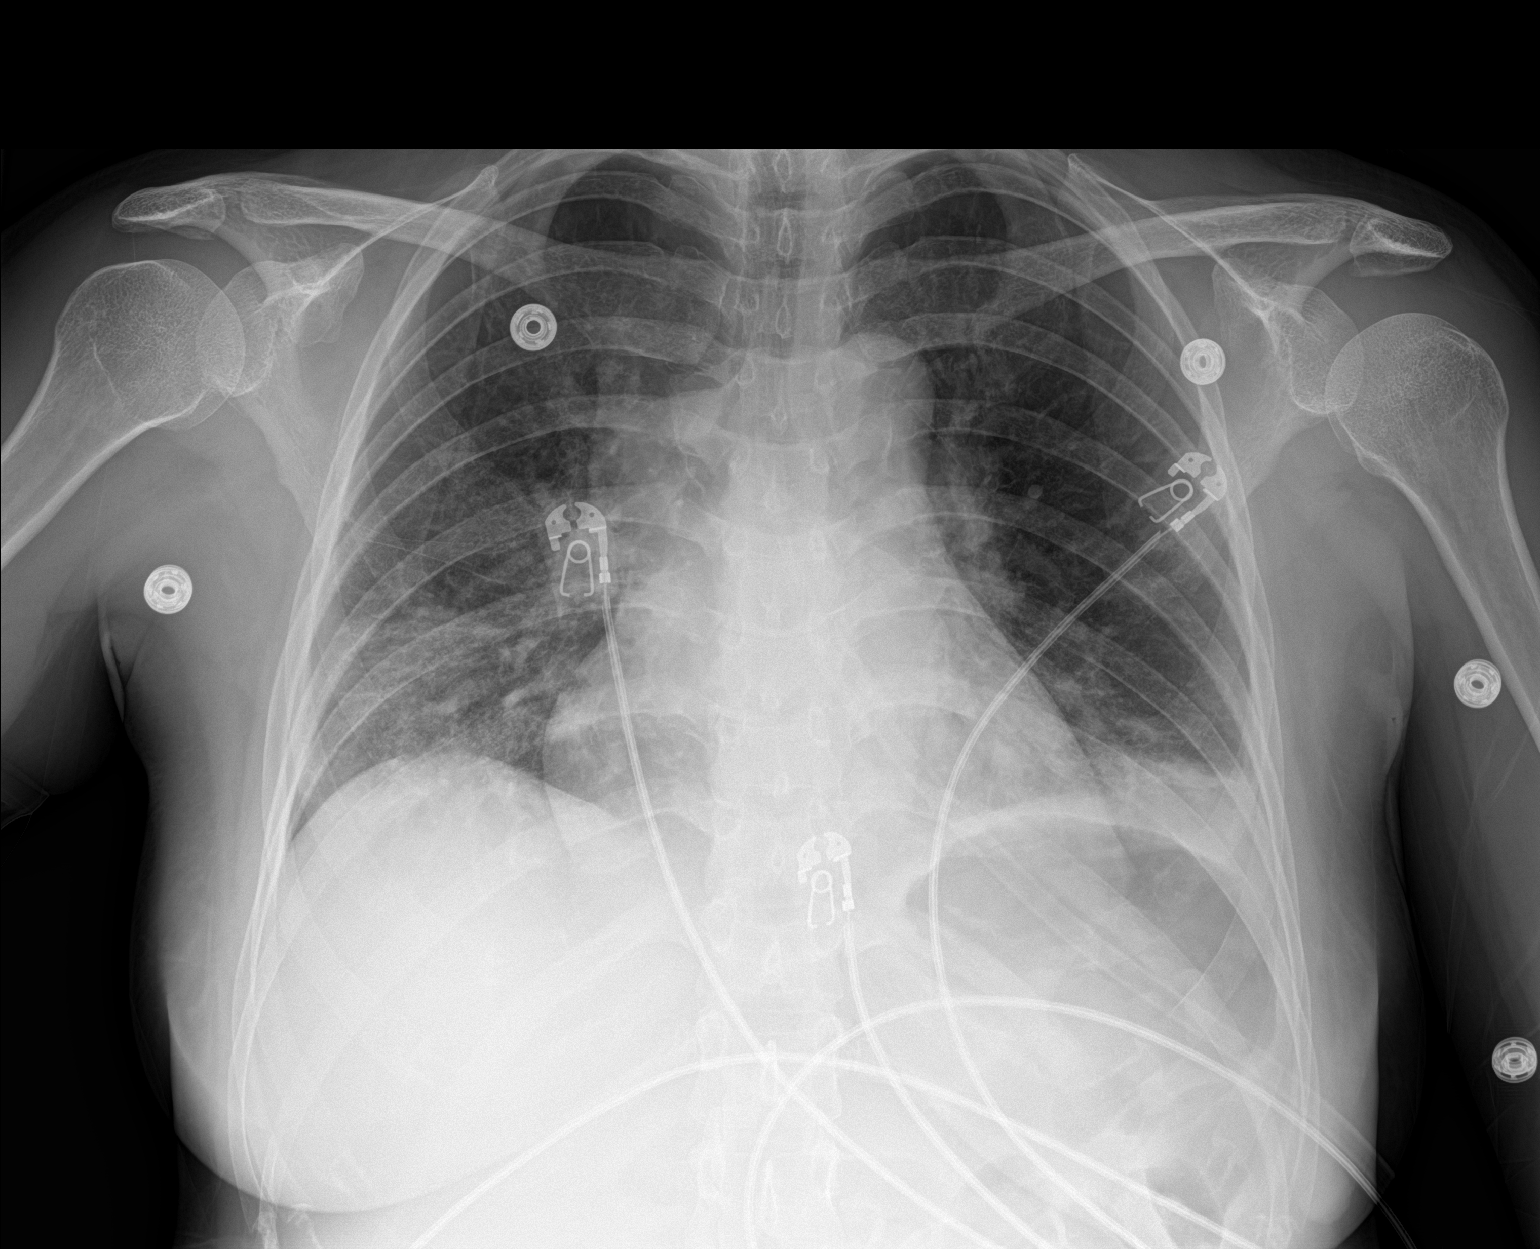

[1 of 1 positions shown; findings below may reference images not displayed]

FINDINGS: Persistent low inspiratory volumes with patchy bibasilar airspace
opacities. No significant interval change. No evidence of pulmonary
edema. Suspect persistent small left pleural effusion. No evidence
of pneumothorax. Cardiac and mediastinal contours remain unchanged.
No acute osseous abnormality.
IMPRESSION: Persistently low lung volumes with patchy bibasilar airspace
opacities which may reflect pneumonia, aspiration, or less likely
atelectasis.

## 2020-06-22 IMAGING — DX PORTABLE CHEST - 1 VIEW
1 series · 1 of 1 positions shown · non-contrast
Comparison: 08/07/2018

CLINICAL DATA: Abnormal respiration

EXAM:
PORTABLE CHEST 1 VIEW

[chest]
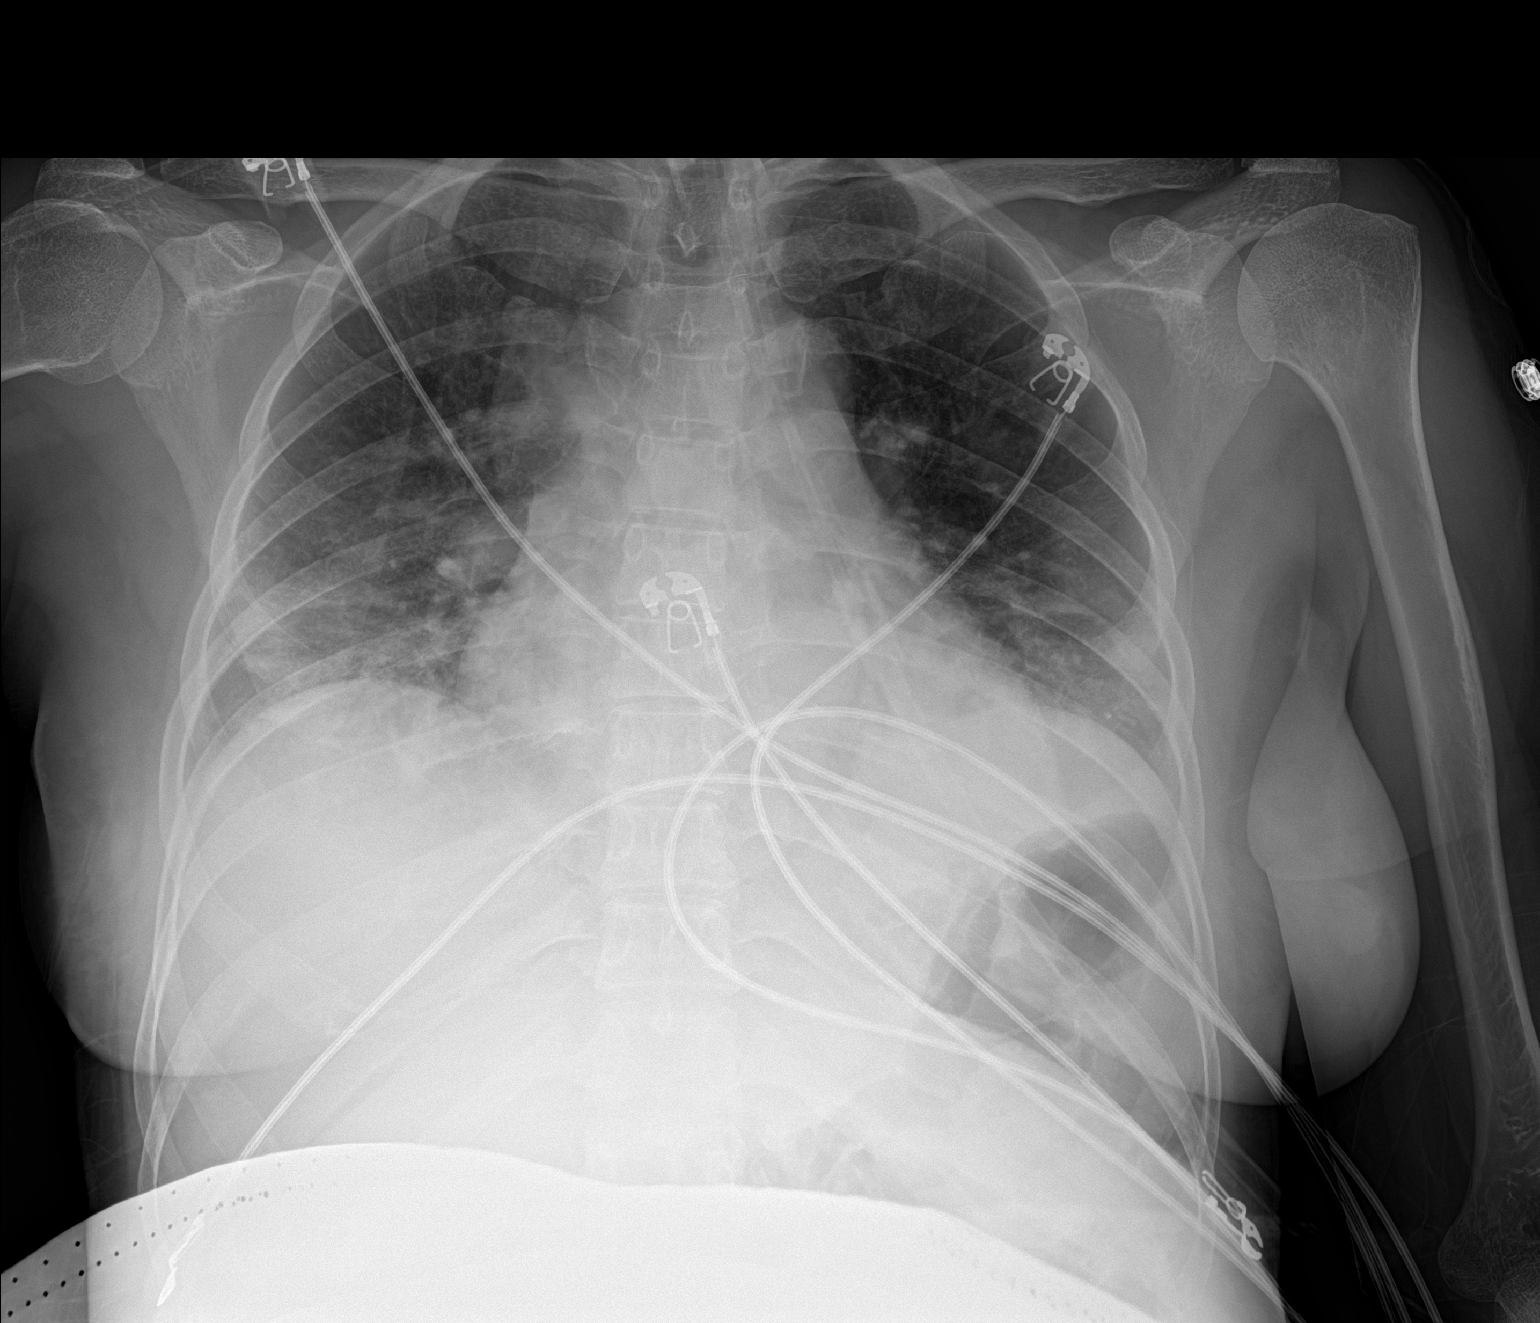

[1 of 1 positions shown; findings below may reference images not displayed]

FINDINGS: Mild patchy bilateral lower lobe opacities, atelectasis versus
pneumonia, grossly unchanged. No definite pleural effusions. No
pneumothorax.

The heart is top-normal in size.
IMPRESSION: Mild patchy bilateral lower lobe opacities, atelectasis versus
pneumonia.

## 2021-04-12 ENCOUNTER — Encounter (HOSPITAL_COMMUNITY): Payer: Self-pay | Admitting: *Deleted

## 2021-04-12 ENCOUNTER — Emergency Department (HOSPITAL_COMMUNITY)
Admission: EM | Admit: 2021-04-12 | Discharge: 2021-04-12 | Disposition: A | Discharge status: Home / Self Care | Payer: Medicaid Other | Attending: Emergency Medicine | Admitting: Emergency Medicine

## 2021-04-12 ENCOUNTER — Emergency Department (HOSPITAL_COMMUNITY): Payer: Medicaid Other

## 2021-04-12 DIAGNOSIS — R350 Frequency of micturition: Secondary | ICD-10-CM | POA: Diagnosis not present

## 2021-04-12 DIAGNOSIS — M545 Low back pain, unspecified: Secondary | ICD-10-CM | POA: Insufficient documentation

## 2021-04-12 DIAGNOSIS — R109 Unspecified abdominal pain: Secondary | ICD-10-CM | POA: Diagnosis not present

## 2021-04-12 DIAGNOSIS — R319 Hematuria, unspecified: Secondary | ICD-10-CM | POA: Diagnosis present

## 2021-04-12 DIAGNOSIS — R1030 Lower abdominal pain, unspecified: Secondary | ICD-10-CM | POA: Diagnosis not present

## 2021-04-12 DIAGNOSIS — N390 Urinary tract infection, site not specified: Secondary | ICD-10-CM | POA: Diagnosis not present

## 2021-04-12 DIAGNOSIS — R000 Tachycardia, unspecified: Secondary | ICD-10-CM | POA: Diagnosis not present

## 2021-04-12 LAB — CBC WITH DIFFERENTIAL/PLATELET
Abs Immature Granulocytes: 0.07 10*3/uL (ref 0.00–0.07)
Basophils Absolute: 0 10*3/uL (ref 0.0–0.1)
Basophils Relative: 0 %
Eosinophils Absolute: 0.2 10*3/uL (ref 0.0–0.5)
Eosinophils Relative: 1 %
HCT: 38.2 % (ref 36.0–46.0)
Hemoglobin: 12.7 g/dL (ref 12.0–15.0)
Immature Granulocytes: 1 %
Lymphocytes Relative: 16 %
Lymphs Abs: 2 10*3/uL (ref 0.7–4.0)
MCH: 29.4 pg (ref 26.0–34.0)
MCHC: 33.2 g/dL (ref 30.0–36.0)
MCV: 88.4 fL (ref 80.0–100.0)
Monocytes Absolute: 0.8 10*3/uL (ref 0.1–1.0)
Monocytes Relative: 6 %
Neutro Abs: 10.1 10*3/uL — ABNORMAL HIGH (ref 1.7–7.7)
Neutrophils Relative %: 76 %
Platelets: 226 10*3/uL (ref 150–400)
RBC: 4.32 MIL/uL (ref 3.87–5.11)
RDW: 13.6 % (ref 11.5–15.5)
WBC: 13.2 10*3/uL — ABNORMAL HIGH (ref 4.0–10.5)
nRBC: 0 % (ref 0.0–0.2)

## 2021-04-12 LAB — URINALYSIS, MICROSCOPIC (REFLEX)

## 2021-04-12 LAB — COMPREHENSIVE METABOLIC PANEL
ALT: 16 U/L (ref 0–44)
AST: 21 U/L (ref 15–41)
Albumin: 4.6 g/dL (ref 3.5–5.0)
Alkaline Phosphatase: 47 U/L (ref 38–126)
Anion gap: 8 (ref 5–15)
BUN: 12 mg/dL (ref 6–20)
CO2: 24 mmol/L (ref 22–32)
Calcium: 9.5 mg/dL (ref 8.9–10.3)
Chloride: 103 mmol/L (ref 98–111)
Creatinine, Ser: 0.64 mg/dL (ref 0.44–1.00)
GFR, Estimated: >60 mL/min (ref >60)
Glucose, Bld: 113 mg/dL — ABNORMAL HIGH (ref 70–99)
Potassium: 3.1 mmol/L — ABNORMAL LOW (ref 3.5–5.1)
Sodium: 135 mmol/L (ref 135–145)
Total Bilirubin: 0.3 mg/dL (ref 0.3–1.2)
Total Protein: 8.2 g/dL — ABNORMAL HIGH (ref 6.5–8.1)

## 2021-04-12 LAB — URINALYSIS, ROUTINE W REFLEX MICROSCOPIC
Bilirubin Urine: NEGATIVE
Glucose, UA: NEGATIVE mg/dL
Ketones, ur: NEGATIVE mg/dL
Nitrite: NEGATIVE
Protein, ur: NEGATIVE mg/dL
Specific Gravity, Urine: <1.005 — ABNORMAL LOW (ref 1.005–1.030)
pH: 6 (ref 5.0–8.0)

## 2021-04-12 LAB — I-STAT BETA HCG BLOOD, ED (MC, WL, AP ONLY): I-stat hCG, quantitative: <5 m[IU]/mL (ref <5)

## 2021-04-12 MED ORDER — SODIUM CHLORIDE 0.9 % IV SOLN
1.0000 g | Freq: Once | INTRAVENOUS | Status: AC
  Administered 2021-04-12: 1 g via INTRAVENOUS
  Filled 2021-04-12: qty 10

## 2021-04-12 MED ORDER — MORPHINE SULFATE (PF) 4 MG/ML IV SOLN
4.0000 mg | Freq: Once | INTRAVENOUS | Status: AC
  Administered 2021-04-12: 4 mg via INTRAVENOUS
  Filled 2021-04-12: qty 1

## 2021-04-12 MED ORDER — CEPHALEXIN 500 MG PO CAPS: 500.0000 mg | ORAL_CAPSULE | Freq: Three times a day (TID) | ORAL | 0 refills | Status: DC

## 2021-04-12 MED ORDER — ONDANSETRON HCL 4 MG PO TABS: 4.0000 mg | ORAL_TABLET | Freq: Three times a day (TID) | ORAL | 0 refills | Status: DC | PRN

## 2021-04-12 MED ORDER — SODIUM CHLORIDE 0.9 % IV BOLUS
1000.0000 mL | Freq: Once | INTRAVENOUS | Status: AC
  Administered 2021-04-12: 1000 mL via INTRAVENOUS

## 2021-04-12 MED ORDER — ONDANSETRON HCL 4 MG/2ML IJ SOLN
4.0000 mg | Freq: Once | INTRAMUSCULAR | Status: AC
  Administered 2021-04-12: 4 mg via INTRAVENOUS
  Filled 2021-04-12: qty 2

## 2021-04-12 MED ORDER — IOHEXOL 300 MG/ML  SOLN
100.0000 mL | Freq: Once | INTRAMUSCULAR | Status: AC | PRN
  Administered 2021-04-12: 100 mL via INTRAVENOUS

## 2021-04-12 NOTE — ED Triage Notes (Signed)
Pt speaks Burmese, has a friend at the bedside she requested to interpret. She has a hard time urinating today, some blood in the urine. Started today. Unsure of fevers, "felt cold". Pain in the abdomen, mainly on the left side.

## 2021-04-12 NOTE — ED Provider Triage Note (Signed)
Emergency Medicine Provider Triage Evaluation Note  Alicia Riley , a 41 y.o. female  was evaluated in triage.  Pt complains of blood in her urine.  This started this afternoon.  She also has suprapelvic and left lower quadrant pain.  No fevers.  She notes that she does occasionally have blood in her stools however that is only when she eats spicy food.  Review of Systems  Positive: See aboveNegative:   Physical Exam  BP 115/76    Pulse (!) 127    Temp 98.3 F (36.8 C) (Oral)    Resp 16    SpO2 100%  Gen:   Awake, no distress   Resp:  Normal effort  MSK:   Moves extremities without difficulty  Other:  Normal speech.   Medical Decision Making  Medically screening exam initiated at 8:15 PM.  Appropriate orders placed.  Grier Winstead was informed that the remainder of the evaluation will be completed by another provider, this initial triage assessment does not replace that evaluation, and the importance of remaining in the ED until their evaluation is complete.  Note: Portions of this report may have been transcribed using voice recognition software. Every effort was made to ensure accuracy; however, inadvertent computerized transcription errors may be present    Lorin Glass, PA-C 04/12/21 2015

## 2021-04-12 NOTE — ED Notes (Signed)
Patient transported to CT 

## 2021-04-12 NOTE — ED Provider Notes (Signed)
[facility]   CSN: [id number] Arrival date & time: 04/12/21  2002     History  Chief Complaint  Patient presents with   Dysuria    Alicia Riley is a 41 y.o. female.  The history is provided by the patient. The history is limited by a language barrier. A language interpreter was used.  Dysuria   Pt presents to the ED today with complaints of blood in her urine. States that around 4 PM she noticed blood in her urine and pain with urination, as well as increased frequency. She has been to the bathroom multiple times since then and has noticed blood in her urine, but the last time she did there was no blood. She endorses pain in her low back bilaterally and her lower abdomen. She denies fever or chills, N/V/D.   Home Medications Prior to Admission medications   Medication Sig Start Date End Date Taking? Authorizing Provider  AMBULATORY NON FORMULARY MEDICATION 1 Device by Other route once a week. Blood Pressure Cuff Medium Monitored Regularly at home ICD 10:Z34.90 08/14/18   Reva BoresPratt, Tanya S, MD  ibuprofen (ADVIL) 600 MG tablet Take 1 tablet (600 mg total) by mouth every 6 (six) hours. 10/06/18   Arvilla MarketWallace, Catherine Lauren, MD  norethindrone (ORTHO MICRONOR) 0.35 MG tablet Take 1 tablet (0.35 mg total) by mouth daily. 11/07/18   Marny LowensteinWenzel, Julie N, PA-C  polyethylene glycol (MIRALAX / GLYCOLAX) 17 g packet Take 17 g by mouth daily as needed for mild constipation. 08/15/18   Mullis, Kiersten P, DO  Prenatal Vit-Fe Fumarate-FA (PRENATAL MULTIVITAMIN) TABS tablet Take 1 tablet by mouth daily.     [provider]  senna-docusate (SENOKOT-S) 8.6-50 MG tablet Take 2 tablets by mouth daily. 10/07/18   Arvilla MarketWallace, Catherine Lauren, MD      Allergies    Patient has no known allergies.    Review of Systems   Review of Systems  Genitourinary:  Positive for dysuria.  All other systems reviewed and are negative.  Physical Exam Updated Vital  Signs BP 115/76    Pulse (!) 127    Temp 98.3 F (36.8 C) (Oral)    Resp 16    SpO2 100%  Physical Exam Vitals and nursing note reviewed.  Constitutional:      General: She is not in acute distress.    Appearance: She is well-developed.  HENT:     Head: Atraumatic.  Eyes:     Conjunctiva/sclera: Conjunctivae normal.  Cardiovascular:     Rate and Rhythm: Tachycardia present.     Pulses: Normal pulses.     Heart sounds: Normal heart sounds.  Pulmonary:     Effort: Pulmonary effort is normal.     Breath sounds: No wheezing, rhonchi or rales.  Abdominal:     Palpations: Abdomen is soft.     Tenderness: There is no abdominal tenderness.  Musculoskeletal:     Cervical back: Neck supple.  Skin:    Findings: No rash.  Neurological:     Mental Status: She is alert.  Psychiatric:        Mood and Affect: Mood normal.    ED Results / Procedures / Treatments   Labs (all labs ordered are listed, but only abnormal results are displayed) Labs Reviewed  COMPREHENSIVE METABOLIC PANEL - Abnormal; Notable for the following components:      Result Value   Potassium 3.1 (*)    Glucose, Bld 113 (*)  Total Protein 8.2 (*)    All other components within normal limits  URINALYSIS, ROUTINE W REFLEX MICROSCOPIC - Abnormal; Notable for the following components:   Specific Gravity, Urine <1.005 (*)    Hgb urine dipstick LARGE (*)    Leukocytes,Ua LARGE (*)    All other components within normal limits  CBC WITH DIFFERENTIAL/PLATELET - Abnormal; Notable for the following components:   WBC 13.2 (*)    Neutro Abs 10.1 (*)    All other components within normal limits  URINALYSIS, MICROSCOPIC (REFLEX) - Abnormal; Notable for the following components:   Bacteria, UA RARE (*)    All other components within normal limits  URINE CULTURE  I-STAT BETA HCG BLOOD, ED ([facility], [facility], [facility] ONLY)    EKG None  Radiology No results found.  CLINICAL DATA: Abdominal pain, acute, nonlocalized  EXAM: CT  ABDOMEN AND PELVIS WITH CONTRAST  TECHNIQUE: Multidetector CT imaging of the abdomen and pelvis was performed using the standard protocol following bolus administration of intravenous contrast.  RADIATION DOSE REDUCTION: This exam was performed according to the departmental dose-optimization program which includes automated exposure control, adjustment of the mA and/or kV according to patient size and/or use of iterative reconstruction technique.  CONTRAST: OMNIPAQUE IOHEXOL 300 MG/ML SOLN  COMPARISON: None.  FINDINGS: Lower chest: No acute abnormality.  Hepatobiliary: No focal liver abnormality. No gallstones, gallbladder wall thickening, or pericholecystic fluid. No biliary dilatation.  Pancreas: No focal lesion. Normal pancreatic contour. No surrounding inflammatory changes. No main pancreatic ductal dilatation.  Spleen: Normal in size without focal abnormality.  Adrenals/Urinary Tract:  No adrenal nodule bilaterally.  Bilateral kidneys enhance symmetrically. Left subcentimeter hypodensity too small to characterize.  No hydronephrosis. No hydroureter.  The urinary bladder is unremarkable.  Stomach/Bowel: Stomach is within normal limits. No evidence of bowel wall thickening or dilatation. Appendix appears normal.  Vascular/Lymphatic: No abdominal aorta or iliac aneurysm. No abdominal, pelvic, or inguinal lymphadenopathy.  Reproductive: Uterus and bilateral adnexa are unremarkable.  Other: No intraperitoneal free fluid. No intraperitoneal free gas. No organized fluid collection.  Musculoskeletal:  No abdominal wall hernia or abnormality.  No suspicious lytic or blastic osseous lesions. No acute displaced fracture. Multilevel degenerative changes of the spine.  IMPRESSION: No acute intra-abdominal or abnormality to explain etiology of patient's symptoms   Electronically Signed By: Tish Frederickson M.D. On: 04/12/2021 22:30   Procedures Procedures     Medications Ordered in ED Medications  cefTRIAXone (ROCEPHIN) 1 g in sodium chloride 0.9 % 100 mL IVPB (has no administration in time range)  sodium chloride 0.9 % bolus 1,000 mL (1,000 mLs Intravenous New Bag/Given 04/12/21 2208)  morphine 4 MG/ML injection 4 mg (4 mg Intravenous Given 04/12/21 2142)  ondansetron (ZOFRAN) injection 4 mg (4 mg Intravenous Given 04/12/21 2142)  iohexol (OMNIPAQUE) 300 MG/ML solution 100 mL (100 mLs Intravenous Contrast Given 04/12/21 2220)    ED Course/ Medical Decision Making/ A&P                           Medical Decision Making Amount and/or Complexity of Data Reviewed Labs: ordered. Radiology: ordered.  Risk Prescription drug management.   BP 113/86    Pulse 98    Temp 98.3 F (36.8 C) (Oral)    Resp 16    SpO2 98%   9:00 PM This is a 41 year old female who speaks Burmese presenting with complaints of urinary discomfort.  History was obtained with the help of  her friend who is at bedside.  Patient gave permission for her friend to interpret.  She is here with complaints of burning on urination with increased urinary frequency urgency and she has noticed blood in the urine that started throughout the day today.  She also endorsed having some chills.  She endorsed nausea without vomiting.  No specific treatment tried.  On exam, patient is resting in bed comfortably appears to be in no distress acute discomfort.  She is tachycardic on exam, skin is warm to the touch, abdomen is nontender no significant CVA tenderness.  Currently or temperature is 98.3, blood pressure of 115/76, she is not tachypneic or hypoxic.  Due to language barrier, will consider CT scan of the abdomen pelvis  This patient presents to the ED for concern of abd pain, this involves an extensive number of treatment options, and is a complaint that carries with it a high risk of complications and morbidity.  The differential diagnosis includes potential kidney stone versus  pyelonephritis versus other abdominal pathology.  Co morbidities that complicate the patient evaluation language barrier Additional history obtained:  Additional history obtained from her friend at bedside External records from outside source obtained and reviewed including no prior record  Lab Tests:  I Ordered, and personally interpreted labs.  The pertinent results include:  leukocytosis with WBC 13.2.  K+ 3.1, will give supplementation.  UA showing large leukocyte and large Hgb.  Rocephin and IVF given with morphine and zofran for sxs control. Urine culture sent.   Imaging Studies ordered:  I ordered imaging studies including abd/pelvis CT I independently visualized and interpreted imaging which showed no acute finding I agree with the radiologist interpretation   Medicines ordered and prescription drug management:  I ordered medication including IV morphine  for pain management and Rocephin for UTI Reevaluation of the patient after these medicines showed that the patient improved I have reviewed the patients home medicines and have made adjustments as needed  Problem List / ED Course: UTI  Reevaluation:  After the interventions noted above, I reevaluated the patient and found that they have :improved  Dispostion:  After consideration of the diagnostic results and the patients response to treatment, I feel that the patent would benefit from keflex antibiotic as treatment of UTI and zofran for nausea. Return precaution given. All questions answered to patient's satisfaction through language interpreter.          Final Clinical Impression(s) / ED Diagnoses Final diagnoses:  Lower urinary tract infectious disease    Rx / DC Orders ED Discharge Orders          Ordered    cephALEXin (KEFLEX) 500 MG capsule  3 times daily        04/12/21 2254    ondansetron (ZOFRAN) 4 MG tablet  Every 8 hours PRN        04/12/21 2254              Fayrene Helper,  PA-C 04/12/21 2325    Terald Sleeper, MD 04/13/21 1308

## 2021-04-12 NOTE — Discharge Instructions (Addendum)
You have been diagnosed with a urinary tract infection.  Please take keflex as prescribed as treatment of your UTI.  Take zofran as needed for nausea.  Return to the ER if your symptoms worsen or if you have other concerns.   ?????? ???????????????????????????? ???????????????????? ???? UTI ??????????? ?????????????????? keflex ???????? ?????????????????? ?????????? ?????????? ???????? ??????????????????? ???????????? ????????? ?????????????????????????? ER ???? ??????????? sainsai  seelamkyaungg poe wainkyaungg  hcaitsayytwaeshi htarrsai . saineat UTI  kusamhuaahpyit  saatmhaat sanyaatine keflex  sout par .  pyahoet aaan hkyinnaatwat loaautsalo  jo hp raanko  sout par .  [facility] Librarian, academic hcoerainmhumyarrshipark ER shoet  pyanswarr par .

## 2021-04-13 ENCOUNTER — Telehealth: Payer: Self-pay

## 2021-04-13 NOTE — Telephone Encounter (Signed)
Transition Care Management Unsuccessful Follow-up Telephone Call  Date of discharge and from where:  04/12/2021 from [facility]   Attempts:  1st Attempt  Reason for unsuccessful TCM follow-up call:  Left voice message

## 2021-04-14 NOTE — Telephone Encounter (Signed)
Transition Care Management Unsuccessful Follow-up Telephone Call ° °Date of discharge and from where:  04/12/2021 from [facility] ° °Attempts:  2nd Attempt ° °Reason for unsuccessful TCM follow-up call:  Left voice message ° ° ° °

## 2021-04-15 LAB — URINE CULTURE: Culture: >=100000 — AB

## 2021-04-16 NOTE — Telephone Encounter (Signed)
Post ED Visit - Positive Culture Follow-up  Culture report reviewed by antimicrobial stewardship pharmacist: [facility] []  , Pharm.D. []  Enzo Bi, Pharm.D., BCPS AQ-ID []  , Pharm.D., BCPS []  Celedonio Miyamoto, Pharm.D., BCPS []  [place], Garvin Fila.D., BCPS, AAHIVP []  , Pharm.D., BCPS, AAHIVP []  Georgina Pillion, PharmD, BCPS []  , PharmD, BCPS []  [place], PharmD, BCPS []  [place], PharmD []  , PharmD, BCPS [x]  Estella Husk, PharmD  Pharmacy Team []  Lysle Pearl, PharmD []  , PharmD []  Phillips Climes, PharmD []  , Rph []  Agapito Games) , PharmD []  Verlan Friends, PharmD []  , PharmD []  Mervyn Gay, PharmD []  , PharmD []  Narda Bonds, PharmD []  [facility], PharmD []  , PharmD []  Len Childs, PharmD   Positive urine culture Treated with Cephalexin, organism sensitive to the same and no further patient follow-up is required at this time.  04/16/2021, 1:29 PM

## 2021-04-17 NOTE — Telephone Encounter (Signed)
Transition Care Management Unsuccessful Follow-up Telephone Call ° °Date of discharge and from where:  04/12/2021[facility]  ° °Attempts:  3rd Attempt ° °Reason for unsuccessful TCM follow-up call:  Left voice message ° °  °

## 2021-04-24 ENCOUNTER — Encounter: Payer: Self-pay | Admitting: Internal Medicine

## 2021-04-24 ENCOUNTER — Other Ambulatory Visit: Payer: Self-pay

## 2021-04-24 ENCOUNTER — Ambulatory Visit: Payer: Medicaid Other | Attending: Internal Medicine | Admitting: Internal Medicine

## 2021-04-24 VITALS — BP 114/69 | HR 83 | Resp 16 | Ht 63.0 in | Wt 122.0 lb

## 2021-04-24 DIAGNOSIS — K648 Other hemorrhoids: Secondary | ICD-10-CM

## 2021-04-24 DIAGNOSIS — Z23 Encounter for immunization: Secondary | ICD-10-CM | POA: Diagnosis not present

## 2021-04-24 DIAGNOSIS — R300 Dysuria: Secondary | ICD-10-CM

## 2021-04-24 DIAGNOSIS — K5904 Chronic idiopathic constipation: Secondary | ICD-10-CM

## 2021-04-24 DIAGNOSIS — K625 Hemorrhage of anus and rectum: Secondary | ICD-10-CM

## 2021-04-24 MED ORDER — DOCUSATE SODIUM 100 MG PO CAPS: 100.0000 mg | ORAL_CAPSULE | Freq: Two times a day (BID) | ORAL | 1 refills | Status: DC | PRN

## 2021-04-24 MED ORDER — CIPROFLOXACIN HCL 500 MG PO TABS: 500.0000 mg | ORAL_TABLET | Freq: Two times a day (BID) | ORAL | 0 refills | Status: DC

## 2021-04-24 MED ORDER — HYDROCORTISONE (PERIANAL) 2.5 % EX CREA: 1.0000 "application " | TOPICAL_CREAM | Freq: Two times a day (BID) | CUTANEOUS | 0 refills | Status: DC

## 2021-04-24 NOTE — Progress Notes (Signed)
Patient ID: Alicia Riley, female    DOB: 1980/10/06  MRN: [id number]  CC: New Patient (Initial Visit), Hemorrhoids, and Hospitalization Follow-up (ED)   Subjective: Alicia Riley is a 41 y.o. female who presents for re-est care.  Last seen here 08/2018 Her concerns today include:   Patient recently seen in the emergency room 04/12/2021 with polyuria, dysuria and hematuria.  She was found to have E. coli UTI.  CT abdomen and pelvis negative.  She was given a dose of IV Rocephin.  She was discharged on Keflex. Pt completed abx.  Reports she still has a little pain and burning post urination.  Feels like she does not completely empty.  No further hematuria  C/o constipation intermittently since she had last child in 2020.   Reports rectal bleeding with constipation.  Notice blood with BM about once a wk.  Blood when she wipes.  No dripping of blood in the bowl.  Pain in LLQ with the constipation and when she eats spicy foods. Notice small mass around the anus which she thinks may be hemorrhoid.  Noticed it after giving birth to last child in 2020.   No N/V. She denies any significant wgh changes over the past 1 yr. I note that her weight is down 19 pounds from when she was last seen here in June 2022.  However she was in the late stages of her pregnancy at that time. Uses OTC stool softener 2-3 days a wk which helps.  No fhx of colon/GI cancers.  Patient Active Problem List   Diagnosis Date Noted   Respiration abnormal    Constipation    COVID-19 08/02/2018   Language barrier affecting health care 05/11/2013   Cervical polyp 05/11/2013     Current Outpatient Medications on File Prior to Visit  Medication Sig Dispense Refill   norethindrone (ORTHO MICRONOR) 0.35 MG tablet Take 1 tablet (0.35 mg total) by mouth daily. (Patient not taking: Reported on 04/24/2021) 1 Package 11   polyethylene glycol (MIRALAX / GLYCOLAX) 17 g packet Take 17 g by mouth daily as needed for mild constipation. (Patient not  taking: Reported on 04/24/2021) 14 each 0   No current facility-administered medications on file prior to visit.    No Known Allergies  Social History   Socioeconomic History   Marital status: Married    Spouse name: Not on file   Number of children: Not on file   Years of education: Not on file   Highest education level: Not on file  Occupational History   Not on file  Tobacco Use   Smoking status: Never   Smokeless tobacco: Never  Substance and Sexual Activity   Alcohol use: No   Drug use: No   Sexual activity: Not Currently    Comment: last had intercourse 1 month ago  Other Topics Concern   Not on file  Social History Narrative   Not on file   Social Determinants of Health   Financial Resource Strain: Not on file  Food Insecurity: Not on file  Transportation Needs: Not on file  Physical Activity: Not on file  Stress: Not on file  Social Connections: Not on file  Intimate Partner Violence: Not on file    No family history on file.  Past Surgical History:  Procedure Laterality Date   DILATION AND EVACUATION N/A 08/03/2015   Procedure: DILATATION AND EVACUATION;  Surgeon: Jonnie Kind, MD;  Location: [facility];  Service: Gynecology;  Laterality: N/A;  NO PAST SURGERIES      ROS: Review of Systems Negative except as stated above  PHYSICAL EXAM: BP 114/69    Pulse 83    Resp 16    Ht 5\' 3"  (1.6 m)    Wt 122 lb (55.3 kg)    SpO2 100%    BMI 21.61 kg/m   Wt Readings from Last 3 Encounters:  04/24/21 122 lb (55.3 kg)  09/15/18 141 lb 8 oz (64.2 kg)  09/11/18 142 lb (64.4 kg)    Physical Exam  General appearance - alert, well appearing, young to middle-aged female and in no distress Mental status - normal mood, behavior, speech, dress, motor activity, and thought processes Eyes -Pink conjunctiva Chest - clear to auscultation, no wheezes, rales or rhonchi, symmetric air entry Heart - normal rate, regular rhythm, normal S1, S2, no murmurs, rubs, clicks or  gallops Abdomen -normal bowel sounds, nondistended, soft, slight left lower quadrant tenderness without guarding or rebound. Rectal -CMA Pollock present.  She has hemorrhoids/rectal tag at the 3 o'clock position.  It is not inflamed.  It is not tender to touch.  No masses felt in the rectal vault.  No stools felt in the rectal vault.  Hemoccult test was negative.   CMP Latest Ref Rng & Units 04/12/2021 09/11/2018 08/15/2018  Glucose 70 - 99 mg/dL 299(B) - 92  BUN 6 - 20 mg/dL 12 - 12  Creatinine 7.16 - 1.00 mg/dL 9.67 - 8.93  Sodium 810 - 145 mmol/L 135 - 137  Potassium 3.5 - 5.1 mmol/L 3.1(L) - 3.6  Chloride 98 - 111 mmol/L 103 - 103  CO2 22 - 32 mmol/L 24 - 23  Calcium 8.9 - 10.3 mg/dL 9.5 - 8.8(L)  Total Protein 6.5 - 8.1 g/dL 8.2(H) 6.3 6.4(L)  Total Bilirubin 0.3 - 1.2 mg/dL 0.3 <1.7 0.4  Alkaline Phos 38 - 126 U/L 47 99 87  AST 15 - 41 U/L 21 27 173(H)  ALT 0 - 44 U/L 16 22 283(H)   Lipid Panel  No results found for: CHOL, TRIG, HDL, CHOLHDL, VLDL, LDLCALC, LDLDIRECT  CBC    Component Value Date/Time   WBC 13.2 (H) 04/12/2021 2021   RBC 4.32 04/12/2021 2021   HGB 12.7 04/12/2021 2021   HGB 10.8 (L) 05/29/2018 1543   HCT 38.2 04/12/2021 2021   HCT 32.3 (L) 05/29/2018 1543   PLT 226 04/12/2021 2021   PLT 194 05/29/2018 1543   MCV 88.4 04/12/2021 2021   MCV 89 05/29/2018 1543   MCH 29.4 04/12/2021 2021   MCHC 33.2 04/12/2021 2021   RDW 13.6 04/12/2021 2021   RDW 13.1 05/29/2018 1543   LYMPHSABS 2.0 04/12/2021 2021   LYMPHSABS 1.1 05/29/2018 1543   MONOABS 0.8 04/12/2021 2021   EOSABS 0.2 04/12/2021 2021   EOSABS 0.2 05/29/2018 1543   BASOSABS 0.0 04/12/2021 2021   BASOSABS 0.0 05/29/2018 1543    ASSESSMENT AND PLAN: 1. Chronic idiopathic constipation Stressed the importance of keeping the bowel movements soft and regular to prevent hemorrhoids and rectal tears.  Recommend increasing fiber in the diet which can be found in fruits and green leafy vegetables.  Eating  brand cereals will also help.  Encouraged her to drink several glasses of water daily.  I have placed her on Colace to use as needed.  2. Other hemorrhoids See #1 above. Is a sitz bath's discussed and encouraged. I have given some Anusol cream to use twice a day whenever she has a  flareup of hemorrhoids. - hydrocortisone (ANUSOL-HC) 2.5 % rectal cream; Place 1 application rectally 2 (two) times daily.  Dispense: 30 g; Refill: 0 - Comprehensive metabolic panel - docusate sodium (COLACE) 100 MG capsule; Take 1 capsule (100 mg total) by mouth 2 (two) times daily as needed for mild constipation.  Dispense: 60 capsule; Refill: 1  3. Rectal bleeding See #1 and 2 above. - CBC  4. Dysuria We will recheck urinalysis and urine culture.  E. coli that grew on recent culture was more sensitive to Cipro then to cephalosporins.  I have placed her on Cipro to use twice a day for 5 days.  Patient is not pregnant and she is not breast-feeding. - Urinalysis, Routine w reflex microscopic - Urine Culture - ciprofloxacin (CIPRO) 500 MG tablet; Take 1 tablet (500 mg total) by mouth 2 (two) times daily.  Dispense: 10 tablet; Refill: 0  5. Need for immunization against influenza - Flu Vaccine QUAD 68mo+IM (Fluarix, Fluzone & Alfiuria Quad PF)    [employer] Language interpreter used during this encounter. [id number], Loura Pardon  Patient was given the opportunity to ask questions.  Patient verbalized understanding of the plan and was able to repeat key elements of the plan.   Orders Placed This Encounter  Procedures   Urine Culture   Flu Vaccine QUAD 7mo+IM (Fluarix, Fluzone & Alfiuria Quad PF)   Urinalysis, Routine w reflex microscopic   CBC   Comprehensive metabolic panel     Requested Prescriptions   Signed Prescriptions Disp Refills   hydrocortisone (ANUSOL-HC) 2.5 % rectal cream 30 g 0    Sig: Place 1 application rectally 2 (two) times daily.   ciprofloxacin (CIPRO) 500 MG tablet 10 tablet 0    Sig: Take 1  tablet (500 mg total) by mouth 2 (two) times daily.   docusate sodium (COLACE) 100 MG capsule 60 capsule 1    Sig: Take 1 capsule (100 mg total) by mouth 2 (two) times daily as needed for mild constipation.    Return in about 5 weeks (around 05/29/2021).  Karle Plumber, MD, FACP

## 2021-04-25 LAB — URINALYSIS, ROUTINE W REFLEX MICROSCOPIC
Bilirubin, UA: NEGATIVE
Glucose, UA: NEGATIVE
Ketones, UA: NEGATIVE
Leukocytes,UA: NEGATIVE
Nitrite, UA: NEGATIVE
Protein,UA: NEGATIVE
RBC, UA: NEGATIVE
Specific Gravity, UA: 1.024 (ref 1.005–1.030)
Urobilinogen, Ur: 0.2 mg/dL (ref 0.2–1.0)
pH, UA: 6.5 (ref 5.0–7.5)

## 2021-04-25 LAB — COMPREHENSIVE METABOLIC PANEL
ALT: 14 IU/L (ref 0–32)
AST: 18 IU/L (ref 0–40)
Albumin/Globulin Ratio: 1.6 (ref 1.2–2.2)
Albumin: 4.9 g/dL — ABNORMAL HIGH (ref 3.8–4.8)
Alkaline Phosphatase: 52 IU/L (ref 44–121)
BUN/Creatinine Ratio: 23 (ref 9–23)
BUN: 15 mg/dL (ref 6–24)
Bilirubin Total: 0.4 mg/dL (ref 0.0–1.2)
CO2: 25 mmol/L (ref 20–29)
Calcium: 9.2 mg/dL (ref 8.7–10.2)
Chloride: 102 mmol/L (ref 96–106)
Creatinine, Ser: 0.66 mg/dL (ref 0.57–1.00)
Globulin, Total: 3 g/dL (ref 1.5–4.5)
Glucose: 89 mg/dL (ref 70–99)
Potassium: 3.9 mmol/L (ref 3.5–5.2)
Sodium: 140 mmol/L (ref 134–144)
Total Protein: 7.9 g/dL (ref 6.0–8.5)
eGFR: 114 mL/min/{1.73_m2} (ref >59)

## 2021-04-25 LAB — CBC
Hematocrit: 37.5 % (ref 34.0–46.6)
Hemoglobin: 12.1 g/dL (ref 11.1–15.9)
MCH: 28.4 pg (ref 26.6–33.0)
MCHC: 32.3 g/dL (ref 31.5–35.7)
MCV: 88 fL (ref 79–97)
Platelets: 209 10*3/uL (ref 150–450)
RBC: 4.26 x10E6/uL (ref 3.77–5.28)
RDW: 13.3 % (ref 11.7–15.4)
WBC: 5.1 10*3/uL (ref 3.4–10.8)

## 2021-04-26 LAB — URINE CULTURE: Organism ID, Bacteria: NO GROWTH

## 2021-04-26 NOTE — Progress Notes (Signed)
Let patient know that kidney and liver function tests are normal.  Urine culture did not grow any bacteria.  Urinalysis was normal.  Blood cell counts normal.

## 2021-05-02 ENCOUNTER — Telehealth: Payer: Self-pay

## 2021-05-02 NOTE — Telephone Encounter (Signed)
Contacted pt to go over lab results pt didn't answer lvm   Sent a CRM and forward labs to NT to give pt labs when they call back   

## 2021-06-05 ENCOUNTER — Encounter: Payer: Self-pay | Admitting: Internal Medicine

## 2021-06-05 ENCOUNTER — Encounter (INDEPENDENT_AMBULATORY_CARE_PROVIDER_SITE_OTHER): Payer: Self-pay

## 2021-06-05 ENCOUNTER — Other Ambulatory Visit: Payer: Self-pay

## 2021-06-05 ENCOUNTER — Ambulatory Visit: Payer: Medicaid Other | Attending: Internal Medicine | Admitting: Internal Medicine

## 2021-06-05 VITALS — BP 101/67 | HR 79 | Resp 16 | Ht 63.0 in | Wt 123.8 lb

## 2021-06-05 DIAGNOSIS — G8929 Other chronic pain: Secondary | ICD-10-CM | POA: Diagnosis not present

## 2021-06-05 DIAGNOSIS — K219 Gastro-esophageal reflux disease without esophagitis: Secondary | ICD-10-CM | POA: Diagnosis not present

## 2021-06-05 DIAGNOSIS — R1032 Left lower quadrant pain: Secondary | ICD-10-CM | POA: Diagnosis not present

## 2021-06-05 DIAGNOSIS — K625 Hemorrhage of anus and rectum: Secondary | ICD-10-CM | POA: Diagnosis not present

## 2021-06-05 DIAGNOSIS — K5904 Chronic idiopathic constipation: Secondary | ICD-10-CM | POA: Diagnosis not present

## 2021-06-05 DIAGNOSIS — J301 Allergic rhinitis due to pollen: Secondary | ICD-10-CM | POA: Diagnosis not present

## 2021-06-05 MED ORDER — OMEPRAZOLE 20 MG PO CPDR: 20.0000 mg | DELAYED_RELEASE_CAPSULE | Freq: Every day | ORAL | 3 refills | Status: DC

## 2021-06-05 NOTE — Progress Notes (Signed)
? ? ?Patient ID: Alicia Riley, female    DOB: October 07, 1980  MRN: [id number] ? ?CC: Constipation ? ? ?Subjective: ?Alicia Riley is a 41 y.o. female who presents for 6 wks f/u constipation ?Her concerns today include:  ? ?Pt presents for f/u constipation and hemorrhoids/rectal bleeding. ?On last visit pt c/o chronic constipation since birth of last child in 2000 with rectal bleeding.  No wgh changes.  Found to have hemorrhoid/rectal tag at 3 o'clock position.   ? ?Pt reports she has BM every day.  No further blood in stools.  Taking Colace 1-2 times a day.  Also using Anusol cream daily. ?Still gets chronic daily pain in left abdomen upper and lower which she describes as a "pinching pain" that is mild.  Pt not able to say how long it lasts.  Worse if she presses on LT side of abdomin, eats spicy foods and with constipation.  Endorses burning in stomach with spicy foods. ?No associated N/V with the pain.   ?No abnormal vaginal dischg.  Sexually active with spouse only ? ?Wants allergy med for itchy ears, itchy watery eyes and running nose.  No taking  ? ?Patient Active Problem List  ? Diagnosis Date Noted  ? Other hemorrhoids 04/24/2021  ? Respiration abnormal   ? Constipation   ? COVID-19 08/02/2018  ? Language barrier affecting health care 05/11/2013  ? Cervical polyp 05/11/2013  ?  ? ?Current Outpatient Medications on File Prior to Visit  ?Medication Sig Dispense Refill  ? ciprofloxacin (CIPRO) 500 MG tablet Take 1 tablet (500 mg total) by mouth 2 (two) times daily. 10 tablet 0  ? docusate sodium (COLACE) 100 MG capsule Take 1 capsule (100 mg total) by mouth 2 (two) times daily as needed for mild constipation. 60 capsule 1  ? hydrocortisone (ANUSOL-HC) 2.5 % rectal cream Place 1 application rectally 2 (two) times daily. 30 g 0  ? norethindrone (ORTHO MICRONOR) 0.35 MG tablet Take 1 tablet (0.35 mg total) by mouth daily. (Patient not taking: Reported on 04/24/2021) 1 Package 11  ? polyethylene glycol (MIRALAX / GLYCOLAX) 17 g  packet Take 17 g by mouth daily as needed for mild constipation. (Patient not taking: Reported on 04/24/2021) 14 each 0  ? ?No current facility-administered medications on file prior to visit.  ? ? ?No Known Allergies ? ?Social History  ? ?Socioeconomic History  ? Marital status: Married  ?  Spouse name: Not on file  ? Number of children: Not on file  ? Years of education: Not on file  ? Highest education level: Not on file  ?Occupational History  ? Not on file  ?Tobacco Use  ? Smoking status: Never  ? Smokeless tobacco: Never  ?Substance and Sexual Activity  ? Alcohol use: No  ? Drug use: No  ? Sexual activity: Not Currently  ?  Comment: last had intercourse 1 month ago  ?Other Topics Concern  ? Not on file  ?Social History Narrative  ? Not on file  ? ?Social Determinants of Health  ? ?Financial Resource Strain: Not on file  ?Food Insecurity: Not on file  ?Transportation Needs: Not on file  ?Physical Activity: Not on file  ?Stress: Not on file  ?Social Connections: Not on file  ?Intimate Partner Violence: Not on file  ? ? ?No family history on file. ? ?Past Surgical History:  ?Procedure Laterality Date  ? DILATION AND EVACUATION N/A 08/03/2015  ? Procedure: DILATATION AND EVACUATION;  Surgeon: Tilda BurrowJohn Ferguson V, MD;  Location: [facility];  Service: Gynecology;  Laterality: N/A;  ? NO PAST SURGERIES    ? ? ?ROS: ?Review of Systems ?Negative except as stated above ? ?PHYSICAL EXAM: ?BP 101/67 (BP Location: Right Arm, Patient Position: Sitting, Cuff Size: Normal)   Pulse 79   Resp 16   Ht 5\' 3"  (1.6 m)   Wt 123 lb 12.8 oz (56.2 kg)   LMP 05/26/2021 (Approximate)   SpO2 100%   Breastfeeding No   BMI 21.93 kg/m?   ?Wt Readings from Last 3 Encounters:  ?06/05/21 123 lb 12.8 oz (56.2 kg)  ?04/24/21 122 lb (55.3 kg)  ?09/15/18 141 lb 8 oz (64.2 kg)  ? ? ?Physical Exam ? ?General appearance - alert, well appearing, and in no distress ?Mental status - normal mood, behavior, speech, dress, motor activity, and thought  processes ?Ears - bilateral TM's and external ear canals normal ?Nose - mild enlargement of nasal turbinates ?Mouth - mucous membranes moist, pharynx normal without lesions ?Abdomen - NL bowel sounds, soft, nondistended, mild LT mid to lower quadrant tenderness.  No guarding or rebound ? ? ?CMP Latest Ref Rng & Units 04/24/2021 04/12/2021 09/11/2018  ?Glucose 70 - 99 mg/dL 89 09/13/2018) -  ?BUN 6 - 24 mg/dL 15 12 -  ?Creatinine 0.57 - 1.00 mg/dL 625(W 3.89 -  ?Sodium 134 - 144 mmol/L 140 135 -  ?Potassium 3.5 - 5.2 mmol/L 3.9 3.1(L) -  ?Chloride 96 - 106 mmol/L 102 103 -  ?CO2 20 - 29 mmol/L 25 24 -  ?Calcium 8.7 - 10.2 mg/dL 9.2 9.5 -  ?Total Protein 6.0 - 8.5 g/dL 7.9 3.73) 6.3  ?Total Bilirubin 0.0 - 1.2 mg/dL 0.4 0.3 4.2(A  ?Alkaline Phos 44 - 121 IU/L 52 47 99  ?AST 0 - 40 IU/L 18 21 27   ?ALT 0 - 32 IU/L 14 16 22   ? ?Lipid Panel  ?No results found for: CHOL, TRIG, HDL, CHOLHDL, VLDL, LDLCALC, LDLDIRECT ? ?CBC ?   ?Component Value Date/Time  ? WBC 5.1 04/24/2021 0935  ? WBC 13.2 (H) 04/12/2021 2021  ? RBC 4.26 04/24/2021 0935  ? RBC 4.32 04/12/2021 2021  ? HGB 12.1 04/24/2021 0935  ? HCT 37.5 04/24/2021 0935  ? PLT 209 04/24/2021 0935  ? MCV 88 04/24/2021 0935  ? MCH 28.4 04/24/2021 0935  ? MCH 29.4 04/12/2021 2021  ? MCHC 32.3 04/24/2021 0935  ? MCHC 33.2 04/12/2021 2021  ? RDW 13.3 04/24/2021 0935  ? LYMPHSABS 2.0 04/12/2021 2021  ? LYMPHSABS 1.1 05/29/2018 1543  ? MONOABS 0.8 04/12/2021 2021  ? EOSABS 0.2 04/12/2021 2021  ? EOSABS 0.2 05/29/2018 1543  ? BASOSABS 0.0 04/12/2021 2021  ? BASOSABS 0.0 05/29/2018 1543  ? ? ?ASSESSMENT AND PLAN: ? ?1. Chronic idiopathic constipation ?Improved with measures discussed on last visit and Colace ? ?2. Gastroesophageal reflux disease without esophagitis ?GERD precautions discussed.  Advised to avoid certain foods like spicy foods, tomato-based foods, juices and excessive caffeine.  Advised to eat his last meal at least 2 to 3 hours before laying down at nights and to sleep with  his head slightly elevated.   ?Start Omeprazole ?- omeprazole (PRILOSEC) 20 MG capsule; Take 1 capsule (20 mg total) by mouth daily.  Dispense: 30 capsule; Refill: 3 ? ?3. Abdominal pain, chronic, left lower quadrant ?-pain in LT upper and lower quadrants.  Seems related to GERD and constipation.  Continue Colace, GERD precautions as discussed and start Omeprazole ?- Ambulatory referral to Gastroenterology ? ? ?  4. Rectal bleeding ?Resolve. ?- Ambulatory referral to Gastroenterology ?- ?5. Seasonal allergic rhinitis due to pollen ?Recommend that she purchase generic Claritin OTC and use.  I wrote down the name of it for her.  Looks like her insurance does not cover Zyrtec or Claritin ? ?[employer] used during this encounter. #[id number], Sam ? ?Patient was given the opportunity to ask questions.  Patient verbalized understanding of the plan and was able to repeat key elements of the plan.  ? ?This documentation was completed using Paediatric nurse.  Any transcriptional errors are unintentional. ? ?No orders of the defined types were placed in this encounter. ? ? ? ?Requested Prescriptions  ? ? No prescriptions requested or ordered in this encounter  ? ? ?No follow-ups on file. ? ?Jonah Blue, MD, FACP ?

## 2021-09-25 ENCOUNTER — Encounter: Payer: Self-pay | Admitting: Internal Medicine

## 2021-09-26 ENCOUNTER — Other Ambulatory Visit: Payer: Self-pay | Admitting: Internal Medicine

## 2021-09-26 DIAGNOSIS — G8929 Other chronic pain: Secondary | ICD-10-CM

## 2021-09-26 DIAGNOSIS — K625 Hemorrhage of anus and rectum: Secondary | ICD-10-CM

## 2021-09-27 ENCOUNTER — Other Ambulatory Visit: Payer: Self-pay | Admitting: Internal Medicine

## 2021-09-27 DIAGNOSIS — G8929 Other chronic pain: Secondary | ICD-10-CM

## 2021-09-27 DIAGNOSIS — K625 Hemorrhage of anus and rectum: Secondary | ICD-10-CM

## 2021-10-30 ENCOUNTER — Other Ambulatory Visit: Payer: Self-pay | Admitting: Internal Medicine

## 2021-10-30 DIAGNOSIS — K625 Hemorrhage of anus and rectum: Secondary | ICD-10-CM

## 2021-10-30 DIAGNOSIS — G8929 Other chronic pain: Secondary | ICD-10-CM

## 2021-11-21 ENCOUNTER — Encounter (HOSPITAL_COMMUNITY): Payer: Self-pay | Admitting: *Deleted

## 2021-11-21 ENCOUNTER — Ambulatory Visit (HOSPITAL_COMMUNITY)
Admission: EM | Admit: 2021-11-21 | Discharge: 2021-11-21 | Disposition: A | Discharge status: Home / Self Care | Payer: Medicaid Other | Attending: Physician Assistant | Admitting: Physician Assistant

## 2021-11-21 DIAGNOSIS — H6501 Acute serous otitis media, right ear: Secondary | ICD-10-CM

## 2021-11-21 DIAGNOSIS — H9201 Otalgia, right ear: Secondary | ICD-10-CM

## 2021-11-21 MED ORDER — AMOXICILLIN 500 MG PO CAPS: 500.0000 mg | ORAL_CAPSULE | Freq: Three times a day (TID) | ORAL | 0 refills | Status: DC

## 2021-11-21 MED ORDER — PSEUDOEPHEDRINE HCL 30 MG PO TABS: 30.0000 mg | ORAL_TABLET | Freq: Two times a day (BID) | ORAL | 0 refills | Status: DC

## 2021-11-21 NOTE — Discharge Instructions (Signed)
Advised take ibuprofen or Tylenol for pain relief. Advised to take amoxicillin 500 mg 3 times a day to treat the ear infection. Advised take Sudafed twice daily to help relieve the pressure behind the eardrum. Advised to follow-up PCP or return to urgent care if symptoms fail to improve.

## 2021-11-21 NOTE — ED Triage Notes (Signed)
Pt states through translator that she has pain in her right ear X 4 days, she has been taking Tylenol but it only works temp.

## 2021-11-21 NOTE — ED Provider Notes (Signed)
[facility]    CSN: [id number] Arrival date & time: 11/21/21  1506      History   Chief Complaint Chief Complaint  Patient presents with   Otalgia    HPI Alicia Riley is a 41 y.o. female.   41 year old female presents with right ear pain.  History is being taken through interpreter services with language being Burmese.  Patient indicates for the past 4 days she has been having right ear pain, discomfort and pressure.  She indicates that she has been taking Tylenol with mild relief but as soon as the Tylenol wears off the pain returns.  She indicates she does not have any fever or chills.  She denies any recent upper respiratory infection, and she has not have any ear drainage.   Otalgia   Past Medical History:  Diagnosis Date   Medical history non-contributory     Patient Active Problem List   Diagnosis Date Noted   Gastroesophageal reflux disease without esophagitis 06/05/2021   Seasonal allergic rhinitis due to pollen 06/05/2021   Other hemorrhoids 04/24/2021   Respiration abnormal    Constipation    COVID-19 08/02/2018   Language barrier affecting health care 05/11/2013   Cervical polyp 05/11/2013    Past Surgical History:  Procedure Laterality Date   DILATION AND EVACUATION N/A 08/03/2015   Procedure: DILATATION AND EVACUATION;  Surgeon: [name], MD;  Location: [facility];  Service: Gynecology;  Laterality: N/A;   NO PAST SURGERIES      OB History     Gravida  4   Para  2   Term  2   Preterm      AB  1   Living  2      SAB  1   IAB      Ectopic      Multiple      Live Births  2            Home Medications    Prior to Admission medications   Medication Sig Start Date End Date Taking? Authorizing Provider  amoxicillin (AMOXIL) 500 MG capsule Take 1 capsule (500 mg total) by mouth 3 (three) times daily. 11/21/21  Yes Ellsworth Lennox, PA-C  omeprazole (PRILOSEC) 20 MG capsule TAKE 1 CAPSULE(20 MG) BY MOUTH DAILY 09/26/21  Yes  Marcine Matar, MD  pseudoephedrine (SUDAFED) 30 MG tablet Take 1 tablet (30 mg total) by mouth 2 (two) times daily. 11/21/21  Yes Ellsworth Lennox, PA-C  ciprofloxacin (CIPRO) 500 MG tablet Take 1 tablet (500 mg total) by mouth 2 (two) times daily. 04/24/21   Marcine Matar, MD  docusate sodium (COLACE) 100 MG capsule Take 1 capsule (100 mg total) by mouth 2 (two) times daily as needed for mild constipation. 04/24/21   Marcine Matar, MD  hydrocortisone (ANUSOL-HC) 2.5 % rectal cream Place 1 application rectally 2 (two) times daily. 04/24/21   Marcine Matar, MD    Family History History reviewed. No pertinent family history.  Social History Social History   Tobacco Use   Smoking status: Never   Smokeless tobacco: Never  Substance Use Topics   Alcohol use: No   Drug use: No     Allergies   Patient has no known allergies.   Review of Systems Review of Systems  HENT:  Positive for ear pain (right ear).      Physical Exam Triage Vital Signs ED Triage Vitals [11/21/21 1541]  Enc Vitals Group  BP 108/61     Pulse Rate 79     Resp 18     Temp 98.1 F (36.7 C)     Temp Source Oral     SpO2 98 %     Weight      Height      Head Circumference      Peak Flow      Pain Score      Pain Loc      Pain Edu?      Excl. in GC?    No data found.  Updated Vital Signs BP 108/61 (BP Location: Right Arm)   Pulse 79   Temp 98.1 F (36.7 C) (Oral)   Resp 18   LMP 11/12/2021   SpO2 98%   Visual Acuity Right Eye Distance:   Left Eye Distance:   Bilateral Distance:    Right Eye Near:   Left Eye Near:    Bilateral Near:     Physical Exam Constitutional:      Appearance: Normal appearance.  HENT:     Right Ear: Ear canal normal. Tympanic membrane is injected and erythematous (mild).     Left Ear: Tympanic membrane and ear canal normal.     Mouth/Throat:     Mouth: Mucous membranes are moist.     Pharynx: Oropharynx is clear. No posterior oropharyngeal  erythema.  Cardiovascular:     Rate and Rhythm: Normal rate and regular rhythm.     Heart sounds: Normal heart sounds.  Pulmonary:     Effort: Pulmonary effort is normal.     Breath sounds: Normal breath sounds and air entry. No wheezing, rhonchi or rales.  Lymphadenopathy:     Cervical: No cervical adenopathy.  Neurological:     Mental Status: She is alert.      UC Treatments / Results  Labs (all labs ordered are listed, but only abnormal results are displayed) Labs Reviewed - No data to display  EKG   Radiology No results found.  Procedures Procedures (including critical care time)  Medications Ordered in UC Medications - No data to display  Initial Impression / Assessment and Plan / UC Course  I have reviewed the triage vital signs and the nursing notes.  Pertinent labs & imaging results that were available during my care of the patient were reviewed by me and considered in my medical decision making (see chart for details).    Plan: 1.  Advised take the Amoxil 500 mg 3 times a day until completed to treat the ear infection. 2.  Advised take Sudafed twice daily to help relieve the pressure from behind the eardrum. 3.  Advised to take Tylenol or ibuprofen for pain relief as needed.  Final Clinical Impressions(s) / UC Diagnoses   Final diagnoses:  Right ear pain  Non-recurrent acute serous otitis media of right ear     Discharge Instructions      Advised take ibuprofen or Tylenol for pain relief. Advised to take amoxicillin 500 mg 3 times a day to treat the ear infection. Advised take Sudafed twice daily to help relieve the pressure behind the eardrum. Advised to follow-up PCP or return to urgent care if symptoms fail to improve.    ED Prescriptions     Medication Sig Dispense Auth. Provider   amoxicillin (AMOXIL) 500 MG capsule Take 1 capsule (500 mg total) by mouth 3 (three) times daily. 21 capsule Ellsworth Lennox, PA-C   pseudoephedrine (SUDAFED) 30  MG tablet Take 1  tablet (30 mg total) by mouth 2 (two) times daily. 14 tablet Ellsworth Lennox, PA-C      PDMP not reviewed this encounter.   Ellsworth Lennox, PA-C 11/21/21 1608

## 2022-07-23 ENCOUNTER — Telehealth: Payer: Self-pay

## 2022-07-23 NOTE — Telephone Encounter (Signed)
Attempted to contact patient. No working #. AS, CMA 

## 2022-12-15 ENCOUNTER — Emergency Department (HOSPITAL_COMMUNITY)
Admission: EM | Admit: 2022-12-15 | Discharge: 2022-12-15 | Disposition: A | Discharge status: Home / Self Care | Payer: Medicaid Other | Attending: Emergency Medicine | Admitting: Emergency Medicine

## 2022-12-15 ENCOUNTER — Emergency Department (HOSPITAL_COMMUNITY): Payer: Medicaid Other

## 2022-12-15 ENCOUNTER — Encounter (HOSPITAL_COMMUNITY): Payer: Self-pay

## 2022-12-15 DIAGNOSIS — M545 Low back pain, unspecified: Secondary | ICD-10-CM | POA: Insufficient documentation

## 2022-12-15 DIAGNOSIS — G8929 Other chronic pain: Secondary | ICD-10-CM | POA: Diagnosis not present

## 2022-12-15 DIAGNOSIS — M5459 Other low back pain: Secondary | ICD-10-CM | POA: Diagnosis not present

## 2022-12-15 LAB — URINALYSIS, ROUTINE W REFLEX MICROSCOPIC
Bilirubin Urine: NEGATIVE
Glucose, UA: NEGATIVE mg/dL
Hgb urine dipstick: NEGATIVE
Ketones, ur: 5 mg/dL — AB
Leukocytes,Ua: NEGATIVE
Nitrite: NEGATIVE
Protein, ur: NEGATIVE mg/dL
Specific Gravity, Urine: 1.017 (ref 1.005–1.030)
pH: 7 (ref 5.0–8.0)

## 2022-12-15 MED ORDER — KETOROLAC TROMETHAMINE 15 MG/ML IJ SOLN
15.0000 mg | Freq: Once | INTRAMUSCULAR | Status: AC
  Administered 2022-12-15: 15 mg via INTRAMUSCULAR
  Filled 2022-12-15: qty 1

## 2022-12-15 MED ORDER — ACETAMINOPHEN 500 MG PO TABS
1000.0000 mg | ORAL_TABLET | Freq: Once | ORAL | Status: AC
  Administered 2022-12-15: 1000 mg via ORAL
  Filled 2022-12-15: qty 2

## 2022-12-15 MED ORDER — OXYCODONE HCL 5 MG PO TABS
5.0000 mg | ORAL_TABLET | Freq: Once | ORAL | Status: AC
  Administered 2022-12-15: 5 mg via ORAL
  Filled 2022-12-15: qty 1

## 2022-12-15 MED ORDER — LIDOCAINE 5 % EX PTCH
1.0000 | MEDICATED_PATCH | CUTANEOUS | Status: DC
  Administered 2022-12-15: 1 via TRANSDERMAL
  Filled 2022-12-15: qty 1

## 2022-12-15 MED ORDER — METHOCARBAMOL 500 MG PO TABS: 500.0000 mg | ORAL_TABLET | Freq: Two times a day (BID) | ORAL | 0 refills | Status: DC

## 2022-12-15 MED ORDER — FENTANYL CITRATE PF 50 MCG/ML IJ SOSY
25.0000 ug | PREFILLED_SYRINGE | Freq: Once | INTRAMUSCULAR | Status: AC
  Administered 2022-12-15: 25 ug via INTRAMUSCULAR
  Filled 2022-12-15: qty 1

## 2022-12-15 MED ORDER — OXYCODONE HCL 5 MG PO TABS
5.0000 mg | ORAL_TABLET | Freq: Four times a day (QID) | ORAL | 0 refills | Status: AC | PRN
Start: 2022-12-15 — End: 2022-12-20

## 2022-12-15 MED ORDER — LIDOCAINE 5 % EX PTCH: 1.0000 | MEDICATED_PATCH | CUTANEOUS | 0 refills | Status: DC

## 2022-12-15 MED ORDER — METHOCARBAMOL 500 MG PO TABS
500.0000 mg | ORAL_TABLET | Freq: Once | ORAL | Status: AC
  Administered 2022-12-15: 500 mg via ORAL
  Filled 2022-12-15: qty 1
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
## 2022-12-15 NOTE — ED Notes (Signed)
Unable to urinate

## 2022-12-15 NOTE — ED Provider Notes (Addendum)
Pt seen by Dr Jearld Fenton.  Please see her note.  Pt with complaints of back pain.  Plan on dc home with supportive care.  Pt able to ambulate with assistance although with lower back pain.  Ua without infection   Linwood Dibbles, MD 12/15/22 1736  Notified pt's bp is low after meds.  WIll give fluids , food reassess     Linwood Dibbles, MD 12/15/22 1754

## 2022-12-15 NOTE — Discharge Instructions (Addendum)
Thank you for coming to [facility] Emergency Department. You were seen for acute on chronic left lower back pain. We did an exam, labs, and imaging, and these showed no acute findings.  Please follow up with your primary care provider within 1 week.  We have prescribed robaxin, a muscle relaxer, to take 500 mg twice per day for back pain. You can alternate taking Tylenol and ibuprofen as needed for pain. You can take 650mg  tylenol (acetaminophen) every 4-6 hours, and 600 mg ibuprofen 3 times a day. You can take oxycodone 5 mg every 4-6 hours as needed for severe breakthrough pain. Heat or ice packs may help, massage and stretching may help as well. Lidocaine patches can be applied to the area once per day.  You can call [facility] to follow-up with a spine doctor who may be able to help you as well.  Return to the ED if you develop any of the following: - Fever (100.4 F or 38 C) or chills at home that do not respond to over the counter medications - Weakness, numbness, or tingling in your extremities - Difficulty emptying bladder / urinary incontinence - Fecal incontinence - Uncontrolled nausea/vomiting with inability to keep down liquids - Feeling as though you are going to pass out or passing out - Anything else that concerns you    [place] ?????????????? ????????????? ???????????????? ????????????????? ???????? ???????? ??????????? ?????????? ????????????????? ??????????????? ?????????????? ???????????????????? ??????????????????????? ?????????????? ??????????? ????????????????????????????  ??????????? 1 ????????? ???? ?????????????????????????? ??????????? ??????????  ??????????????? ??????????? 500 mg ????????????????? robaxin ??? ??????????????? ???????????????? ?????????????? ?????????? Tylenol ????? Ibuprofen ??????? ???????????????????????????? 4-6 ?????????? 650mg  tylenol (acetaminophen) ??? 600 mg ibuprofen ???? ? ????? ????????????????  ????????????????????? ?????????? oxycodone 5 mg ??? 4-6 ?????????? ???????????????? ??? ????????? ??????????????? ???????????????? ?????????????????? ???????????????????? ???????????????? Lidocaine patches ??????? ??????????? ???????? ????????? ????????????????  ??????? ???????????????? ??????????????????? ??????????????????????? [place] ??????????????????????????? ?????????????????????? ?????????????????   ????????????? ?????????? ???????????? ED ???? ???????????? - ????????? (100.4 F ????????? 38 C) ????????? ????????????????? ??????????? ???????? ??????????? - ????????????? ???????????? ????????? ?????????????? ??????????? - ?????????????????????????/ ??????????????????? - ????????????????????? - ??????????? ????????????? ?????/????????? - ??????????????????? ?????????????????????? - ????????? ???????????????????

## 2022-12-15 NOTE — ED Triage Notes (Signed)
Pt c/o left lower back pain that radiates down left leg upon waking yesterday.  PT denies injury.

## 2022-12-15 NOTE — ED Notes (Signed)
Pt's Bp dropped down to the 80s. MD made aware. Fluids offered.

## 2022-12-17 ENCOUNTER — Telehealth: Payer: Self-pay | Admitting: Internal Medicine

## 2022-12-17 NOTE — Telephone Encounter (Signed)
Interpreter # [phone] given appointment for 01/07/2023

## 2022-12-17 NOTE — Telephone Encounter (Signed)
Pt is calling to schedule a hospital follow up in 14 days. No appts until mid October. Please advise CB- [phone]

## 2022-12-26 DIAGNOSIS — M5416 Radiculopathy, lumbar region: Secondary | ICD-10-CM | POA: Diagnosis not present

## 2023-01-07 ENCOUNTER — Ambulatory Visit: Payer: Medicaid Other | Attending: Family Medicine | Admitting: Family Medicine

## 2023-01-07 ENCOUNTER — Encounter: Payer: Self-pay | Admitting: Family Medicine

## 2023-01-07 VITALS — BP 106/70 | HR 79 | Ht 63.0 in | Wt 124.0 lb

## 2023-01-07 DIAGNOSIS — R1012 Left upper quadrant pain: Secondary | ICD-10-CM | POA: Diagnosis not present

## 2023-01-07 DIAGNOSIS — K648 Other hemorrhoids: Secondary | ICD-10-CM

## 2023-01-07 MED ORDER — HYDROCORTISONE (PERIANAL) 2.5 % EX CREA
1.0000 | TOPICAL_CREAM | Freq: Two times a day (BID) | CUTANEOUS | 2 refills | Status: AC
Start: 2023-01-07 — End: ?

## 2023-01-07 NOTE — Patient Instructions (Signed)
Hemorrhoids Hemorrhoids are swollen veins in and around the rectum or the opening of the butt (anus). There are two types of hemorrhoids: Internal. These occur in the veins just inside the rectum. They may poke through to the outside and become irritated and painful. External. These occur in the veins outside the anus. They can be felt as a painful swelling or hard lump near the anus. Most hemorrhoids do not cause severe problems. Often, they can be treated at home with diet and lifestyle changes. If home treatments do not help, you may need a procedure to shrink or remove the hemorrhoids. What are the causes? Hemorrhoids are caused by pressure near the anus. This pressure may be caused by: Constipation or diarrhea. Straining to poop. Pregnancy. Obesity. Sitting or riding a bike for a long time. Heavy lifting or other things that cause you to strain. Anal sex. What are the signs or symptoms? Symptoms of this condition include: Pain. Anal itching or irritation. Bleeding from the rectum. Leakage of poop (stool). Swelling of the anus. One or more lumps around the anus. How is this diagnosed? Hemorrhoids can often be diagnosed through a visual exam. Other exams or tests may also be done, such as: A digital rectal exam. This is when your health care provider feels inside your rectum with a gloved finger. Anoscope. This is an exam of the anus using a small tube. A blood test, if you have lost a lot of blood. A sigmoidoscopy or colonoscopy. These are tests to look inside the colon using a tube with a camera on the end. How is this treated? In most cases, hemorrhoids can be treated at home with diet and lifestyle changes. If these changes do not help, you may need to have a procedure done. These procedures can make the hemorrhoids smaller or fully remove them. Common procedures include: Rubber band ligation. Rubber bands are placed at the base of the hemorrhoids to cut off their blood  supply. Sclerotherapy. Medicine is put into the hemorrhoids to shrink them. Infrared coagulation. A type of light energy is used to get rid of the hemorrhoids. Hemorrhoidectomy surgery. The hemorrhoids are removed during surgery. Then, the veins that supply them are tied off. Stapled hemorrhoidopexy surgery. The base of the hemorrhoid is stapled to the wall of the rectum. Follow these instructions at home: Medicines Take over-the-counter and prescription medicines only as told by your provider. Use medicated creams or medicines that are put in the rectum (suppositories) as told by your provider. Eating and drinking  Eat foods that are high in fiber, such as beans, whole grains, and fresh fruits and vegetables. Ask your provider about taking products that have fiber added to them (fiber supplements). Reduce the amount of fat in your diet. You can do this by eating low-fat dairy products, eating less red meat, and avoiding processed foods. Drink enough fluid to keep your pee (urine) pale yellow. Managing pain and swelling  Take warm sitz baths for 20 minutes, 3-4 times a day. This can help ease pain and discomfort. You may do this in a bathtub or you can use a portable sitz bath that fits over the toilet. If told, put ice on the affected area. It may help to use ice packs between sitz baths. Put ice in a plastic bag. Place a towel between your skin and the bag. Leave the ice on for 20 minutes, 2-3 times a day. If your skin turns bright red, remove the ice right away to prevent  skin damage. The risk of damage is higher if you cannot feel pain, heat, or cold. General instructions Exercise. Ask your provider how much and what kind of exercise is best for you. In general, you should do moderate exercise for at least 30 minutes on most days of the week (150 minutes each week). You may want to try walking, biking, or yoga. Go to the bathroom when you have the urge to poop. Do not wait. Avoid  straining to poop. Keep the anus dry and clean. Use wet toilet paper or moist towelettes after you poop. Do not sit on the toilet for a long time. This can increase blood pooling and pain. Where to find more information [employer] of Diabetes and Digestive and Kidney Diseases: [employer] Contact a health care provider if: You have more pain and swelling that do not get better with treatment. You have trouble pooping or you are not able to poop. You have pain or inflammation outside the area of the hemorrhoids. Get help right away if: You are bleeding from your rectum and you cannot get it to stop. This information is not intended to replace advice given to you by your health care provider. Make sure you discuss any questions you have with your health care provider. Document Revised: 11/22/2021 Document Reviewed: 11/22/2021 Elsevier Patient Education  2024 [employer].

## 2023-01-07 NOTE — Progress Notes (Signed)
Subjective:  Patient ID: Alicia Riley, female    DOB: 12/02/1980  Age: 42 y.o. MRN: [id number]  CC: Hospitalization Follow-up (Abdominal pain/Hemorrhoids)   HPI Alicia Riley is a 42 y.o. year old female patient of Dr Laural Benes in today for an ED follow-up visit.  Interval History: Discussed the use of AI scribe software for clinical note transcription with the patient, who gave verbal consent to proceed.  She presents for follow-up after a recent emergency room visit for back pain. She reports that her back pain has resolved completely, rating her pain as zero out of ten. She has completed her prescribed pain medication and is not currently taking any.  The patient's hemorrhoids has been causing intermittent bleeding for over a year. She was previously prescribed a cream, which she applied as needed. She has since run out of this cream and has been using a similar over-the-counter product.  The patient also reports experiencing sharp, shooting pain in her upper left abdomen and side. This pain is mild and isolated, and is triggered by eating spicy food or overeating. She describes the pain as different from menstrual cramping, and more like a sharp, shooting sensation.        Past Medical History:  Diagnosis Date   Medical history non-contributory     Past Surgical History:  Procedure Laterality Date   DILATION AND EVACUATION N/A 08/03/2015   Procedure: DILATATION AND EVACUATION;  Surgeon: [name], MD;  Location: [facility];  Service: Gynecology;  Laterality: N/A;   NO PAST SURGERIES      No family history on file.  Social History   Socioeconomic History   Marital status: Married    Spouse name: Not on file   Number of children: Not on file   Years of education: Not on file   Highest education level: Not on file  Occupational History   Not on file  Tobacco Use   Smoking status: Never   Smokeless tobacco: Never  Substance and Sexual Activity   Alcohol use: No   Drug use: No    Sexual activity: Not Currently    Comment: last had intercourse 1 month ago  Other Topics Concern   Not on file  Social History Narrative   Not on file   Social Determinants of Health   Financial Resource Strain: Not on file  Food Insecurity: Not on file  Transportation Needs: Not on file  Physical Activity: Not on file  Stress: Not on file  Social Connections: Not on file    No Known Allergies  Outpatient Medications Prior to Visit  Medication Sig Dispense Refill   amoxicillin (AMOXIL) 500 MG capsule Take 1 capsule (500 mg total) by mouth 3 (three) times daily. (Patient not taking: Reported on 01/07/2023) 21 capsule 0   ciprofloxacin (CIPRO) 500 MG tablet Take 1 tablet (500 mg total) by mouth 2 (two) times daily. (Patient not taking: Reported on 01/07/2023) 10 tablet 0   docusate sodium (COLACE) 100 MG capsule Take 1 capsule (100 mg total) by mouth 2 (two) times daily as needed for mild constipation. (Patient not taking: Reported on 01/07/2023) 60 capsule 1   lidocaine (LIDODERM) 5 % Place 1 patch onto the skin daily. Remove & Discard patch within 12 hours or as directed by MD (Patient not taking: Reported on 01/07/2023) 30 patch 0   methocarbamol (ROBAXIN) 500 MG tablet Take 1 tablet (500 mg total) by mouth 2 (two) times daily. (Patient not taking: Reported on 01/07/2023) 20  tablet 0   omeprazole (PRILOSEC) 20 MG capsule TAKE 1 CAPSULE(20 MG) BY MOUTH DAILY (Patient not taking: Reported on 01/07/2023) 30 capsule 0   pseudoephedrine (SUDAFED) 30 MG tablet Take 1 tablet (30 mg total) by mouth 2 (two) times daily. (Patient not taking: Reported on 01/07/2023) 14 tablet 0   hydrocortisone (ANUSOL-HC) 2.5 % rectal cream Place 1 application rectally 2 (two) times daily. (Patient not taking: Reported on 01/07/2023) 30 g 0   No facility-administered medications prior to visit.     ROS Review of Systems  Constitutional:  Negative for activity change and appetite change.  HENT:   Negative for sinus pressure and sore throat.   Respiratory:  Negative for chest tightness, shortness of breath and wheezing.   Cardiovascular:  Negative for chest pain and palpitations.  Gastrointestinal:  Positive for abdominal pain. Negative for abdominal distention and constipation.  Genitourinary: Negative.   Musculoskeletal: Negative.   Psychiatric/Behavioral:  Negative for behavioral problems and dysphoric mood.     Objective:  BP 106/70   Pulse 79   Ht 5\' 3"  (1.6 m)   Wt 124 lb (56.2 kg)   SpO2 99%   BMI 21.97 kg/m      01/07/2023   10:39 AM 12/15/2022    6:00 PM 12/15/2022    5:48 PM  BP/Weight  Systolic BP 106 105 88  Diastolic BP 70 67 58  Wt. (Lbs) 124    BMI 21.97 kg/m2        Physical Exam Constitutional:      Appearance: She is well-developed.  Cardiovascular:     Rate and Rhythm: Normal rate.     Heart sounds: Normal heart sounds. No murmur heard. Pulmonary:     Effort: Pulmonary effort is normal.     Breath sounds: Normal breath sounds. No wheezing or rales.  Chest:     Chest wall: No tenderness.  Abdominal:     General: Bowel sounds are normal. There is no distension.     Palpations: Abdomen is soft. There is no mass.     Tenderness: There is no abdominal tenderness (slight LUQ TTP).  Musculoskeletal:        General: Normal range of motion.     Right lower leg: No edema.     Left lower leg: No edema.  Neurological:     Mental Status: She is alert and oriented to person, place, and time.  Psychiatric:        Mood and Affect: Mood normal.        Latest Ref Rng & Units 04/24/2021    9:35 AM 04/12/2021    8:21 PM 09/11/2018   11:41 AM  CMP  Glucose 70 - 99 mg/dL 89  244    BUN 6 - 24 mg/dL 15  12    Creatinine 0.10 - 1.00 mg/dL 2.72  5.36    Sodium 644 - 144 mmol/L 140  135    Potassium 3.5 - 5.2 mmol/L 3.9  3.1    Chloride 96 - 106 mmol/L 102  103    CO2 20 - 29 mmol/L 25  24    Calcium 8.7 - 10.2 mg/dL 9.2  9.5    Total Protein 6.0 -  8.5 g/dL 7.9  8.2  6.3   Total Bilirubin 0.0 - 1.2 mg/dL 0.4  0.3  <0.3   Alkaline Phos 44 - 121 IU/L 52  47  99   AST 0 - 40 IU/L 18  21  27  ALT 0 - 32 IU/L 14  16  22      Lipid Panel  No results found for: "CHOL", "TRIG", "HDL", "CHOLHDL", "VLDL", "LDLCALC", "LDLDIRECT"  CBC    Component Value Date/Time   WBC 5.1 04/24/2021 0935   WBC 13.2 (H) 04/12/2021 2021   RBC 4.26 04/24/2021 0935   RBC 4.32 04/12/2021 2021   HGB 12.1 04/24/2021 0935   HCT 37.5 04/24/2021 0935   PLT 209 04/24/2021 0935   MCV 88 04/24/2021 0935   MCH 28.4 04/24/2021 0935   MCH 29.4 04/12/2021 2021   MCHC 32.3 04/24/2021 0935   MCHC 33.2 04/12/2021 2021   RDW 13.3 04/24/2021 0935   LYMPHSABS 2.0 04/12/2021 2021   LYMPHSABS 1.1 05/29/2018 1543   MONOABS 0.8 04/12/2021 2021   EOSABS 0.2 04/12/2021 2021   EOSABS 0.2 05/29/2018 1543   BASOSABS 0.0 04/12/2021 2021   BASOSABS 0.0 05/29/2018 1543    No results found for: "HGBA1C"  Assessment & Plan:      Hemorrhoids Chronic intermittent bleeding. Previously managed with topical cream. -Refill hemorrhoid cream. -Advise sitz baths with warm water and Epsom salt.  LUQ pain Mild, sharp, left-sided pain under the ribs, triggered by overeating and strong flavors. No associated menstrual symptoms. -Advise dietary modifications to avoid triggers. -Consider potential gas pain.  Resolved Back Pain No current pain, previously managed with pain medication. -No further action required.  Follow-up as needed with primary care provider, Dr. Laural Benes.          Meds ordered this encounter  Medications   hydrocortisone (ANUSOL-HC) 2.5 % rectal cream    Sig: Place 1 Application rectally 2 (two) times daily.    Dispense:  30 g    Refill:  2    Follow-up: Return if symptoms worsen or fail to improve.       Hoy Register, MD, FAAFP. [facility] [place], [place] [phone]   01/07/2023, 11:10 AM

## 2023-02-07 DIAGNOSIS — M7552 Bursitis of left shoulder: Secondary | ICD-10-CM | POA: Diagnosis not present

## 2023-02-07 DIAGNOSIS — M5416 Radiculopathy, lumbar region: Secondary | ICD-10-CM | POA: Diagnosis not present

## 2023-04-29 DIAGNOSIS — Z1231 Encounter for screening mammogram for malignant neoplasm of breast: Secondary | ICD-10-CM | POA: Diagnosis not present

## 2023-04-29 DIAGNOSIS — Z0000 Encounter for general adult medical examination without abnormal findings: Secondary | ICD-10-CM | POA: Diagnosis not present

## 2023-04-29 DIAGNOSIS — E559 Vitamin D deficiency, unspecified: Secondary | ICD-10-CM | POA: Diagnosis not present

## 2023-04-29 DIAGNOSIS — Z1159 Encounter for screening for other viral diseases: Secondary | ICD-10-CM | POA: Diagnosis not present

## 2023-04-29 DIAGNOSIS — Z3200 Encounter for pregnancy test, result unknown: Secondary | ICD-10-CM | POA: Diagnosis not present

## 2023-04-29 DIAGNOSIS — R5383 Other fatigue: Secondary | ICD-10-CM | POA: Diagnosis not present

## 2023-04-29 DIAGNOSIS — N921 Excessive and frequent menstruation with irregular cycle: Secondary | ICD-10-CM | POA: Diagnosis not present

## 2023-04-29 DIAGNOSIS — M545 Low back pain, unspecified: Secondary | ICD-10-CM | POA: Diagnosis not present

## 2023-05-13 DIAGNOSIS — N946 Dysmenorrhea, unspecified: Secondary | ICD-10-CM | POA: Diagnosis not present

## 2023-05-13 DIAGNOSIS — Z0000 Encounter for general adult medical examination without abnormal findings: Secondary | ICD-10-CM | POA: Diagnosis not present

## 2023-05-13 DIAGNOSIS — M545 Low back pain, unspecified: Secondary | ICD-10-CM | POA: Diagnosis not present

## 2023-05-13 DIAGNOSIS — K644 Residual hemorrhoidal skin tags: Secondary | ICD-10-CM | POA: Diagnosis not present

## 2023-05-13 DIAGNOSIS — I4510 Unspecified right bundle-branch block: Secondary | ICD-10-CM | POA: Diagnosis not present

## 2023-05-13 DIAGNOSIS — Z6821 Body mass index (BMI) 21.0-21.9, adult: Secondary | ICD-10-CM | POA: Diagnosis not present

## 2023-05-13 DIAGNOSIS — R1084 Generalized abdominal pain: Secondary | ICD-10-CM | POA: Diagnosis not present

## 2023-05-13 DIAGNOSIS — K219 Gastro-esophageal reflux disease without esophagitis: Secondary | ICD-10-CM | POA: Diagnosis not present

## 2023-05-13 DIAGNOSIS — E559 Vitamin D deficiency, unspecified: Secondary | ICD-10-CM | POA: Diagnosis not present

## 2023-05-20 DIAGNOSIS — M79604 Pain in right leg: Secondary | ICD-10-CM | POA: Diagnosis not present

## 2023-05-20 DIAGNOSIS — R0989 Other specified symptoms and signs involving the circulatory and respiratory systems: Secondary | ICD-10-CM | POA: Diagnosis not present

## 2023-05-20 DIAGNOSIS — I4510 Unspecified right bundle-branch block: Secondary | ICD-10-CM | POA: Diagnosis not present

## 2023-05-20 DIAGNOSIS — M79605 Pain in left leg: Secondary | ICD-10-CM | POA: Diagnosis not present

## 2023-05-21 DIAGNOSIS — R1084 Generalized abdominal pain: Secondary | ICD-10-CM | POA: Diagnosis not present

## 2023-05-21 DIAGNOSIS — N946 Dysmenorrhea, unspecified: Secondary | ICD-10-CM | POA: Diagnosis not present

## 2023-05-27 DIAGNOSIS — Z6821 Body mass index (BMI) 21.0-21.9, adult: Secondary | ICD-10-CM | POA: Diagnosis not present

## 2023-05-27 DIAGNOSIS — J309 Allergic rhinitis, unspecified: Secondary | ICD-10-CM | POA: Diagnosis not present

## 2023-05-27 DIAGNOSIS — R1084 Generalized abdominal pain: Secondary | ICD-10-CM | POA: Diagnosis not present

## 2023-05-27 DIAGNOSIS — Z3200 Encounter for pregnancy test, result unknown: Secondary | ICD-10-CM | POA: Diagnosis not present

## 2023-05-30 DIAGNOSIS — I4510 Unspecified right bundle-branch block: Secondary | ICD-10-CM | POA: Diagnosis not present

## 2023-06-03 DIAGNOSIS — M79605 Pain in left leg: Secondary | ICD-10-CM | POA: Diagnosis not present

## 2023-06-03 DIAGNOSIS — I4510 Unspecified right bundle-branch block: Secondary | ICD-10-CM | POA: Diagnosis not present

## 2023-06-03 DIAGNOSIS — R0989 Other specified symptoms and signs involving the circulatory and respiratory systems: Secondary | ICD-10-CM | POA: Diagnosis not present

## 2023-06-03 DIAGNOSIS — M79604 Pain in right leg: Secondary | ICD-10-CM | POA: Diagnosis not present

## 2023-09-06 ENCOUNTER — Ambulatory Visit (HOSPITAL_COMMUNITY)
Admission: EM | Admit: 2023-09-06 | Discharge: 2023-09-06 | Disposition: A | Discharge status: Home / Self Care | Attending: Emergency Medicine | Admitting: Emergency Medicine

## 2023-09-06 ENCOUNTER — Encounter (HOSPITAL_COMMUNITY): Payer: Self-pay

## 2023-09-06 DIAGNOSIS — H60502 Unspecified acute noninfective otitis externa, left ear: Secondary | ICD-10-CM | POA: Diagnosis not present

## 2023-09-06 MED ORDER — CIPROFLOXACIN-DEXAMETHASONE 0.3-0.1 % OT SUSP: 4.0000 [drp] | Freq: Two times a day (BID) | OTIC | 0 refills | Status: AC

## 2023-09-06 NOTE — ED Triage Notes (Signed)
 Patient presenting with right ear pain onset 3 days. Denies any sick symptoms.   Prescriptions or OTC medications tried: Yes- tylenol  and ibuprofen     with no relief

## 2023-09-06 NOTE — ED Provider Notes (Signed)
 [facility]    CSN: [id number] Arrival date & time: 09/06/23  1621      History   Chief Complaint Chief Complaint  Patient presents with   Otalgia    HPI Alicia Riley is a 43 y.o. female.  Son helps with some interpretation  Here with 3 day history of LEFT ear pain Rating 7/10 pain  Denies having runny nose, congestion, sore throat, cough, fever Has tried tylenol  and ibuprofen   Feels like there is a pimple in the ear She does use q-tips Denies any hearing changes or fullness  Past Medical History:  Diagnosis Date   Medical history non-contributory     Patient Active Problem List   Diagnosis Date Noted   Gastroesophageal reflux disease without esophagitis 06/05/2021   Seasonal allergic rhinitis due to pollen 06/05/2021   Other hemorrhoids 04/24/2021   Respiration abnormal    Constipation    COVID-19 08/02/2018   Language barrier affecting health care 05/11/2013   Cervical polyp 05/11/2013    Past Surgical History:  Procedure Laterality Date   DILATION AND EVACUATION N/A 08/03/2015   Procedure: DILATATION AND EVACUATION;  Surgeon: Albino Hum, MD;  Location: [facility];  Service: Gynecology;  Laterality: N/A;   NO PAST SURGERIES      OB History     Gravida  4   Para  2   Term  2   Preterm      AB  1   Living  2      SAB  1   IAB      Ectopic      Multiple      Live Births  2            Home Medications    Prior to Admission medications   Medication Sig Start Date End Date Taking? Authorizing Provider  ciprofloxacin -dexamethasone  (CIPRODEX) OTIC suspension Place 4 drops into the left ear 2 (two) times daily for 5 days. 09/06/23 09/11/23 Yes Cadon Raczka, Beth Brooke    Family History History reviewed. No pertinent family history.  Social History Social History   Tobacco Use   Smoking status: Never   Smokeless tobacco: Never  Substance Use Topics   Alcohol use: No   Drug use: No     Allergies   Patient has no known  allergies.   Review of Systems Review of Systems Per HPI  Physical Exam Triage Vital Signs ED Triage Vitals  Encounter Vitals Group     BP 09/06/23 1658 102/65     Girls Systolic BP Percentile --      Girls Diastolic BP Percentile --      Boys Systolic BP Percentile --      Boys Diastolic BP Percentile --      Pulse Rate 09/06/23 1658 63     Resp 09/06/23 1658 16     Temp 09/06/23 1658 98.8 F (37.1 C)     Temp Source 09/06/23 1658 Oral     SpO2 09/06/23 1658 98 %     Weight 09/06/23 1657 118 lb (53.5 kg)     Height 09/06/23 1657 5' 3 (1.6 m)     Head Circumference --      Peak Flow --      Pain Score 09/06/23 1656 7     Pain Loc --      Pain Education --      Exclude from Growth Chart --    No data found.  Updated Vital Signs  BP 102/65 (BP Location: Left Arm)   Pulse 63   Temp 98.8 F (37.1 C) (Oral)   Resp 16   Ht 5' 3 (1.6 m)   Wt 118 lb (53.5 kg)   LMP 08/13/2023 (Approximate)   SpO2 98%   BMI 20.90 kg/m    Physical Exam Vitals and nursing note reviewed.  Constitutional:      General: She is not in acute distress. HENT:     Right Ear: Hearing, tympanic membrane and ear canal normal.     Left Ear: Hearing normal. Tenderness present. No laceration or drainage. No mastoid tenderness.     Ears:     Comments: Tragal tenderness LEFT ear. Erythema of upper canal. No bleeding or laceration noted. TM normal. Right ear unremarkable     Nose: Nose normal.     Mouth/Throat:     Mouth: Mucous membranes are moist.     Pharynx: Oropharynx is clear.   Eyes:     Extraocular Movements: Extraocular movements intact.     Conjunctiva/sclera: Conjunctivae normal.     Pupils: Pupils are equal, round, and reactive to light.    Cardiovascular:     Rate and Rhythm: Normal rate and regular rhythm.     Heart sounds: Normal heart sounds.  Pulmonary:     Effort: Pulmonary effort is normal.     Breath sounds: Normal breath sounds.  Abdominal:     General: Abdomen is  flat.     Palpations: Abdomen is soft.     Tenderness: There is no abdominal tenderness.  Lymphadenopathy:     Cervical: No cervical adenopathy.   Neurological:     Mental Status: She is alert and oriented to person, place, and time.     UC Treatments / Results  Labs (all labs ordered are listed, but only abnormal results are displayed) Labs Reviewed - No data to display  EKG   Radiology No results found.  Procedures Procedures (including critical care time)  Medications Ordered in UC Medications - No data to display  Initial Impression / Assessment and Plan / UC Course  I have reviewed the triage vital signs and the nursing notes.  Pertinent labs & imaging results that were available during my care of the patient were reviewed by me and considered in my medical decision making (see chart for details).  Erythema of LEFT canal with tragal tenderness, discomfort with exam. Treat for otitis externa with ciprodex drops BID x 5 days. Advised not using any objects in the ear. Can return if needed. Patient agrees to plan, no questions  Final Clinical Impressions(s) / UC Diagnoses   Final diagnoses:  Acute otitis externa of left ear, unspecified type     Discharge Instructions      CiproDex -- 4 drops in the left ear twice daily for 5 days in a row Do not use any objects in the ear including q-tips or cotton balls Please return if needed     ED Prescriptions     Medication Sig Dispense Auth. Provider   ciprofloxacin -dexamethasone  (CIPRODEX) OTIC suspension Place 4 drops into the left ear 2 (two) times daily for 5 days. 7.5 mL Dazha Kempa, Ivette Marks, PA-C      PDMP not reviewed this encounter.   Olesya Wike, Beth Brooke 09/06/23 1745

## 2023-09-06 NOTE — Discharge Instructions (Addendum)
 CiproDex -- 4 drops in the left ear twice daily for 5 days in a row Do not use any objects in the ear including q-tips or cotton balls Please return if needed

## 2023-11-27 DIAGNOSIS — Z3200 Encounter for pregnancy test, result unknown: Secondary | ICD-10-CM | POA: Diagnosis not present

## 2023-11-27 DIAGNOSIS — M545 Low back pain, unspecified: Secondary | ICD-10-CM | POA: Diagnosis not present

## 2023-11-27 DIAGNOSIS — Z5320 Procedure and treatment not carried out because of patient's decision for unspecified reasons: Secondary | ICD-10-CM | POA: Diagnosis not present

## 2023-11-27 DIAGNOSIS — M25512 Pain in left shoulder: Secondary | ICD-10-CM | POA: Diagnosis not present

## 2023-11-27 DIAGNOSIS — Z6821 Body mass index (BMI) 21.0-21.9, adult: Secondary | ICD-10-CM | POA: Diagnosis not present

## 2023-12-05 DIAGNOSIS — M5412 Radiculopathy, cervical region: Secondary | ICD-10-CM | POA: Diagnosis not present

## 2023-12-05 DIAGNOSIS — M545 Low back pain, unspecified: Secondary | ICD-10-CM | POA: Diagnosis not present

## 2023-12-12 DIAGNOSIS — M5412 Radiculopathy, cervical region: Secondary | ICD-10-CM | POA: Diagnosis not present

## 2023-12-12 DIAGNOSIS — M545 Low back pain, unspecified: Secondary | ICD-10-CM | POA: Diagnosis not present

## 2023-12-13 DIAGNOSIS — M545 Low back pain, unspecified: Secondary | ICD-10-CM | POA: Diagnosis not present

## 2023-12-13 DIAGNOSIS — M5412 Radiculopathy, cervical region: Secondary | ICD-10-CM | POA: Diagnosis not present

## 2023-12-16 DIAGNOSIS — M5412 Radiculopathy, cervical region: Secondary | ICD-10-CM | POA: Diagnosis not present

## 2023-12-16 DIAGNOSIS — M545 Low back pain, unspecified: Secondary | ICD-10-CM | POA: Diagnosis not present

## 2023-12-19 DIAGNOSIS — M545 Low back pain, unspecified: Secondary | ICD-10-CM | POA: Diagnosis not present

## 2023-12-19 DIAGNOSIS — M5412 Radiculopathy, cervical region: Secondary | ICD-10-CM | POA: Diagnosis not present

## 2023-12-23 DIAGNOSIS — M5412 Radiculopathy, cervical region: Secondary | ICD-10-CM | POA: Diagnosis not present

## 2023-12-23 DIAGNOSIS — M545 Low back pain, unspecified: Secondary | ICD-10-CM | POA: Diagnosis not present

## 2024-02-27 DIAGNOSIS — Z6821 Body mass index (BMI) 21.0-21.9, adult: Secondary | ICD-10-CM | POA: Diagnosis not present

## 2024-02-27 DIAGNOSIS — M25512 Pain in left shoulder: Secondary | ICD-10-CM | POA: Diagnosis not present

## 2024-02-27 DIAGNOSIS — Z79899 Other long term (current) drug therapy: Secondary | ICD-10-CM | POA: Diagnosis not present

## 2024-02-27 DIAGNOSIS — M545 Low back pain, unspecified: Secondary | ICD-10-CM | POA: Diagnosis not present

## 2024-02-27 DIAGNOSIS — J309 Allergic rhinitis, unspecified: Secondary | ICD-10-CM | POA: Diagnosis not present

## 2024-02-27 DIAGNOSIS — Z3200 Encounter for pregnancy test, result unknown: Secondary | ICD-10-CM | POA: Diagnosis not present
